# Patient Record
Sex: Female | Born: 1945 | Race: White | Hispanic: No | Marital: Married | Smoking: Never smoker
Health system: Southern US, Community
[De-identification: ages and names within clinical notes are randomized; demographics above are authoritative.]

## PROBLEM LIST (undated history)

## (undated) DIAGNOSIS — C55 Malignant neoplasm of uterus, part unspecified: Secondary | ICD-10-CM

## (undated) HISTORY — DX: Malignant neoplasm of uterus, part unspecified: C55

---

## 1968-01-21 HISTORY — PX: TUBAL LIGATION: SHX77

## 1973-01-20 HISTORY — PX: BREAST EXCISIONAL BIOPSY: SUR124

## 1973-01-20 HISTORY — PX: BREAST LUMPECTOMY: SHX2

## 2016-01-25 DIAGNOSIS — C55 Malignant neoplasm of uterus, part unspecified: Secondary | ICD-10-CM

## 2016-01-25 HISTORY — DX: Malignant neoplasm of uterus, part unspecified: C55

## 2017-04-22 ENCOUNTER — Encounter: Payer: Self-pay | Admitting: Gynecologic Oncology

## 2017-04-22 ENCOUNTER — Inpatient Hospital Stay: Payer: Medicare Other | Attending: Gynecologic Oncology | Admitting: Gynecologic Oncology

## 2017-04-22 VITALS — BP 124/83 | HR 93 | Temp 97.7°F | Resp 20 | Ht 63.0 in | Wt 156.5 lb

## 2017-04-22 DIAGNOSIS — C541 Malignant neoplasm of endometrium: Secondary | ICD-10-CM

## 2017-04-22 DIAGNOSIS — Z8543 Personal history of malignant neoplasm of ovary: Secondary | ICD-10-CM

## 2017-04-22 DIAGNOSIS — Z8542 Personal history of malignant neoplasm of other parts of uterus: Secondary | ICD-10-CM | POA: Insufficient documentation

## 2017-04-22 DIAGNOSIS — Z90710 Acquired absence of both cervix and uterus: Secondary | ICD-10-CM | POA: Diagnosis not present

## 2017-04-22 DIAGNOSIS — N3946 Mixed incontinence: Secondary | ICD-10-CM | POA: Diagnosis not present

## 2017-04-22 DIAGNOSIS — Z90722 Acquired absence of ovaries, bilateral: Secondary | ICD-10-CM | POA: Diagnosis not present

## 2017-04-22 NOTE — Patient Instructions (Addendum)
We will make a referral for you to meet with a urologist at [facility].  Please notify Dr Denman George at phone number [phone] if you notice vaginal bleeding, new pelvic or abdominal pains, bloating, feeling full easy, or a change in bladder or bowel function.   Please contact Dr Serita Grit office at the above number after April 15th to request a follow-up visit with her in July, 2019. She will see you every three months until February, 2020.

## 2017-04-24 ENCOUNTER — Encounter: Payer: Self-pay | Admitting: Gynecologic Oncology

## 2017-04-24 NOTE — Progress Notes (Signed)
New Patient Note: Gyn-Onc  Lisa [name] 72 y.o. female  CC:  Chief Complaint  Patient presents with  . Endometrial cancer ([place])  . Mixed stress and urge urinary incontinence    Assessment/Plan:  [name]  is a 72 y.o.  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 MSI stable.  She is NED on today's exam. Recommend 3 monthly surveillance exams until February, 2020.  She has mixed stress and urge incontinence. I am recommending that she be seen by Dr Matilde Sprang or Dr Louis Meckel from [facility] for further evaluation and treatment of this.   HPI: [name] is a 71 year old parous woman who was diagnosed with a grade 2 endometrioid adenocarcinoma her in January 2018.  At that time she was living in [facility].  On March 10, 2016 Dr. Ronal Fear performed a laparoscopy converted to laparotomy, modified radical abdominal hysterectomy, BSO, retroperitoneal pelvic lymphadenectomy.  Periaortic sampling also took place.  Final pathology revealed a 2 cm grade 2 endometrioid endometrial adenocarcinoma with 70% myometrial invasion present.  There is not a comment in the pathology report of lymphovascular space invasion.  11 lymph nodes were sampled including 4 from the left pelvic 7 from the right pelvic 1 from the left aortic and 2 from the right aortic.  All were negative for apparent metastases.  The ovaries tubes and cervix were free of disease.  She was determined to have high intermediate risk factors based on her uterine pathology and was recommended adjuvant vaginal brachytherapy.  The patient states that she declined this because she was told by her surgeon that he was "sure I got it all out".  She did however receive [employer] chemo resistance assay testing which revealed that her tumor would be sensitive to carboplatin paclitaxel in addition to  multiple other agents.  Genomic analysis revealed intact nuclear expression of mL H1 [place] 2 and [place] 6 and PMS 2 reflecting no loss of expression of mismatch repair proteins and a microsatellite stable tumor.  Since that time she has remained disease free. She has relocated from [place] to [place]. Her major complaints is of mixed stress and urge incontinence (urinary).   Current Meds:  Outpatient Encounter Medications as of 04/22/2017  Medication Sig  . Cholecalciferol (VITAMIN D3 PO) Take 25 mg by mouth daily.  . Magnesium Oxide 400 MG CAPS Take 1 capsule by mouth daily.  . Multiple Vitamins-Minerals (CENTRUM SILVER 50+WOMEN PO) Take 1 tablet by mouth daily.  Marland Kitchen OVER THE COUNTER MEDICATION   . SUPER B COMPLEX/C PO Take 1 tablet by mouth daily.   No facility-administered encounter medications on file as of 04/22/2017.     Allergy:  Allergies  Allergen Reactions  . Codeine Nausea And Vomiting    Social Hx:   Social History   Socioeconomic History  . Marital status: Married    Spouse name: Not on file  . Number of children: Not on file  . Years of education: Not on file  . Highest education level: Not on file  Occupational History  . Not on file  Social Needs  . Financial resource strain: Not on file  . Food insecurity:    Worry: Not on file    Inability: Not on file  . Transportation needs:    Medical: Not on file    Non-medical: Not on file  Tobacco Use  . Smoking status: Never  Smoker  . Smokeless tobacco: Never Used  Substance and Sexual Activity  . Alcohol use: Yes    Comment: occasional drinker  . Drug use: Never  . Sexual activity: Not on file  Lifestyle  . Physical activity:    Days per week: Not on file    Minutes per session: Not on file  . Stress: Not on file  Relationships  . Social connections:    Talks on phone: Not on file    Gets together: Not on file    Attends religious service: Not on file    Active member of club or organization: Not on  file    Attends meetings of clubs or organizations: Not on file    Relationship status: Not on file  . Intimate partner violence:    Fear of current or ex partner: Not on file    Emotionally abused: Not on file    Physically abused: Not on file    Forced sexual activity: Not on file  Other Topics Concern  . Not on file  Social History Narrative  . Not on file    Past Surgical Hx:  Past Surgical History:  Procedure Laterality Date  . BREAST LUMPECTOMY Left 1975  . TUBAL LIGATION  1970    Past Medical Hx:  Past Medical History:  Diagnosis Date  . Uterine cancer ([place]) 01/25/2016    Past Gynecological History:  Endometrial cancer (see above) No LMP recorded.  Family Hx: History reviewed. No pertinent family history.  Review of Systems:  Constitutional  Feels well,    ENT Normal appearing ears and nares bilaterally Skin/Breast  No rash, sores, jaundice, itching, dryness Cardiovascular  No chest pain, shortness of breath, or edema  Pulmonary  No cough or wheeze.  Gastro Intestinal  No nausea, vomitting, or diarrhoea. No bright red blood per rectum, no abdominal pain, change in bowel movement, or constipation.  Genito Urinary  No frequency, urgency, dysuria, no bleeding + urinary incontinence.  Musculo Skeletal  No myalgia, arthralgia, joint swelling or pain  Neurologic  No weakness, numbness, change in gait,  Psychology  No depression, anxiety, insomnia.   Vitals:  Blood pressure 124/83, pulse 93, temperature 97.7 F (36.5 C), temperature source Oral, resp. rate 20, height 5' 3" (1.6 m), weight 156 lb 8 oz (71 kg), SpO2 99 %.  Physical Exam: WD in NAD Neck  Supple NROM, without any enlargements.  Lymph Node Survey No cervical supraclavicular or inguinal adenopathy Cardiovascular  Pulse normal rate, regularity and rhythm. S1 and S2 normal.  Lungs  Clear to auscultation bilateraly, without wheezes/crackles/rhonchi. Good air movement.  Skin  No  rash/lesions/breakdown  Psychiatry  Alert and oriented to person, place, and time  Abdomen  Normoactive bowel sounds, abdomen soft, non-tender and nonobese without evidence of hernia. Back No CVA tenderness Genito Urinary  Vulva/vagina: Normal external female genitalia.  No lesions. No discharge or bleeding.  Bladder/urethra:  No lesions or masses, well supported bladder  Vagina: normal, free of lesions,   Cervix and uterus : surgically absent  Rectal  deferred Extremities  No bilateral cyanosis, clubbing or edema.   Thereasa Solo, MD  04/24/2017, 6:44 PM

## 2017-04-27 ENCOUNTER — Telehealth: Payer: Self-pay | Admitting: *Deleted

## 2017-04-27 NOTE — Telephone Encounter (Signed)
Fax refer to [facility]

## 2017-04-29 NOTE — Telephone Encounter (Signed)
LM for patient that the referral was sent to [facility] earlier this week.  Call [facility] if you have not heard from them by Wednesday 05-06-17 so we can follow up on the referral.

## 2017-05-14 ENCOUNTER — Telehealth: Payer: Self-pay | Admitting: *Deleted

## 2017-05-14 NOTE — Telephone Encounter (Signed)
Patient is scheduled for May 23rd at 9:30am to see Dr. Matilde Sprang

## 2017-05-22 ENCOUNTER — Telehealth: Payer: Self-pay | Admitting: *Deleted

## 2017-05-22 NOTE — Telephone Encounter (Signed)
Attempted to call the patient back, left message for the patient to call the office back. Patient needs to be scheduled for an appt in July.

## 2017-07-24 ENCOUNTER — Telehealth: Payer: Self-pay | Admitting: *Deleted

## 2017-07-24 NOTE — Telephone Encounter (Signed)
Called and moved the appt from July 10th to July 8th

## 2017-07-27 ENCOUNTER — Inpatient Hospital Stay: Payer: Medicare Other | Attending: Gynecologic Oncology | Admitting: Gynecologic Oncology

## 2017-07-27 ENCOUNTER — Encounter: Payer: Self-pay | Admitting: Gynecologic Oncology

## 2017-07-27 VITALS — BP 156/77 | HR 92 | Temp 97.9°F | Resp 18 | Ht 63.0 in | Wt 158.5 lb

## 2017-07-27 DIAGNOSIS — Z90710 Acquired absence of both cervix and uterus: Secondary | ICD-10-CM

## 2017-07-27 DIAGNOSIS — Z90722 Acquired absence of ovaries, bilateral: Secondary | ICD-10-CM

## 2017-07-27 DIAGNOSIS — C541 Malignant neoplasm of endometrium: Secondary | ICD-10-CM

## 2017-07-27 DIAGNOSIS — Z8542 Personal history of malignant neoplasm of other parts of uterus: Secondary | ICD-10-CM

## 2017-07-27 NOTE — Progress Notes (Signed)
Return Patient Note: Gyn-Onc  Lisa [name] 72 y.o. female  CC:  Chief Complaint  Patient presents with  . Endometrial cancer [facility])    Assessment/Plan:  [name]  is a 72 y.o.  year old with a history of stage IB grade [name] endometrioid endometrial cancer with high/intermediate risk features (declined adjuvant therapy). MSI stable.  She is NED on today's exam. Recommend 3 monthly surveillance exams until February, 2020.   HPI: [name] is a 72 year old parous woman who was diagnosed with a grade [name] endometrioid adenocarcinoma her in January 2018.  At that time she was living in [facility].  On March 10, 2016 Dr. Ronal [name] performed a laparoscopy converted to laparotomy, modified radical abdominal hysterectomy, BSO, retroperitoneal pelvic lymphadenectomy.  Periaortic sampling also took place.  Final pathology revealed a [name] cm grade [name] endometrioid endometrial adenocarcinoma with 70% myometrial invasion present.  There is not a comment in the pathology report of lymphovascular space invasion.  11 lymph nodes were sampled including 4 from the left pelvic 7 from the right pelvic 1 from the left aortic and [name] from the right aortic.  All were negative for apparent metastases.  The ovaries tubes and cervix were free of disease.  She was determined to have high intermediate risk factors based on her uterine pathology and was recommended adjuvant vaginal brachytherapy.  The patient states that she declined this because she was told by her surgeon that he was "sure I got it all out".  She did however receive Lisa [name] chemo resistance assay testing which revealed that her tumor would be sensitive to carboplatin paclitaxel in addition to multiple other agents.  Genomic analysis revealed intact nuclear expression of mL Lisa [name] and Lisa [name] and Lisa [name] reflecting no loss of expression of mismatch repair proteins and a microsatellite stable tumor.  She relocated  from [place] to [place].  Interval Hx:  She was seen by [facility] and treated for urinary urge incontinence with Mybetric which has significantly improved symptoms.  She has no symptoms concerning for recurrence.   Current Meds:  Outpatient Encounter Medications as of 07/27/2017  Medication Sig  . Cholecalciferol (VITAMIN D3 PO) Take 25 mg by mouth daily.  . Magnesium Oxide 400 MG CAPS Take 1 capsule by mouth daily.  . mirabegron ER (MYRBETRIQ) 50 MG TB24 tablet Take 50 mg by mouth daily.  . Multiple Vitamins-Minerals (CENTRUM SILVER 50+WOMEN PO) Take 1 tablet by mouth daily.  Marland Kitchen OVER THE COUNTER MEDICATION   . SUPER B COMPLEX/C PO Take 1 tablet by mouth daily.   No facility-administered encounter medications on file as of 07/27/2017.     Allergy:  Allergies  Allergen Reactions  . Codeine Nausea And Vomiting    Social Hx:   Social History   Socioeconomic History  . Marital status: Married    Spouse name: Not on file  . Number of children: Not on file  . Years of education: Not on file  . Highest education level: Not on file  Occupational History  . Not on file  Social Needs  . Financial resource strain: Not on file  . Food insecurity:    Worry: Not on file    Inability: Not on file  . Transportation needs:    Medical: Not on file    Non-medical: Not on file  Tobacco Use  . Smoking status: Never Smoker  . Smokeless tobacco: Never Used  Substance and Sexual Activity  .  Alcohol use: Yes    Comment: occasional drinker  . Drug use: Never  . Sexual activity: Not on file  Lifestyle  . Physical activity:    Days per week: Not on file    Minutes per session: Not on file  . Stress: Not on file  Relationships  . Social connections:    Talks on phone: Not on file    Gets together: Not on file    Attends religious service: Not on file    Active member of club or organization: Not on file    Attends meetings of clubs or organizations: Not on file     Relationship status: Not on file  . Intimate partner violence:    [name] of current or ex partner: Not on file    Emotionally abused: Not on file    Physically abused: Not on file    Forced sexual activity: Not on file  Other Topics Concern  . Not on file  Social History Narrative  . Not on file    Past Surgical Hx:  Past Surgical History:  Procedure Laterality Date  . BREAST LUMPECTOMY Left 1975  . TUBAL LIGATION  1970    Past Medical Hx:  Past Medical History:  Diagnosis Date  . Uterine cancer ([place]) 01/25/2016    Past Gynecological History:  Endometrial cancer (see above) No LMP recorded.  Family Hx: History reviewed. No pertinent family history.  Review of Systems:  Constitutional  Feels well,    ENT Normal appearing ears and nares bilaterally Skin/Breast  No rash, sores, jaundice, itching, dryness Cardiovascular  No chest pain, shortness of breath, or edema  Pulmonary  No cough or wheeze.  Gastro Intestinal  No nausea, vomitting, or diarrhoea. No bright red blood per rectum, no abdominal pain, change in bowel movement, or constipation.  Genito Urinary  No frequency, urgency, dysuria, no bleeding + urinary incontinence - much better on Mybetric.  Musculo Skeletal  No myalgia, arthralgia, joint swelling or pain  Neurologic  No weakness, numbness, change in gait,  Psychology  No depression, anxiety, insomnia.   Vitals:  Blood pressure (!) 156/77, pulse 92, temperature 97.9 F (36.[name] C), temperature source Oral, resp. rate 18, height _0  (1.[name] m), weight 158 lb 8 oz (71.9 kg), SpO2 100 %.  Physical Exam: WD in NAD Neck  Supple NROM, without any enlargements.  Lymph Node Survey No cervical supraclavicular or inguinal adenopathy Cardiovascular  Pulse normal rate, regularity and rhythm. S1 and S2 normal.  Lungs  Clear to auscultation bilateraly, without wheezes/crackles/rhonchi. Good air movement.  Skin  No rash/lesions/breakdown  Psychiatry  Alert and  oriented to person, place, and time  Abdomen  Normoactive bowel sounds, abdomen soft, non-tender and nonobese without evidence of hernia. Back No CVA tenderness Genito Urinary  Vulva/vagina: Normal external female genitalia.  No lesions. No discharge or bleeding.  Bladder/urethra:  No lesions or masses, well supported bladder  Vagina: normal, free of lesions,   Cervix and uterus : surgically absent  Rectal  deferred Extremities  No bilateral cyanosis, clubbing or edema.   Thereasa Solo, MD  07/27/2017, [name]:42 PM

## 2017-07-27 NOTE — Patient Instructions (Signed)
Please notify Dr Denman George at phone number [phone] if you notice vaginal bleeding, new pelvic or abdominal pains, bloating, feeling full easy, or a change in bladder or bowel function.   Please return to see Dr Denman George in 3 months as scheduled.

## 2017-07-29 ENCOUNTER — Ambulatory Visit: Payer: Medicare Other | Admitting: Gynecologic Oncology

## 2017-11-12 ENCOUNTER — Encounter: Payer: Self-pay | Admitting: Gynecologic Oncology

## 2017-11-12 ENCOUNTER — Inpatient Hospital Stay: Payer: Medicare Other | Attending: Gynecologic Oncology | Admitting: Gynecologic Oncology

## 2017-11-12 VITALS — BP 155/79 | HR 84 | Temp 97.6°F | Resp 18 | Ht 63.0 in | Wt 160.2 lb

## 2017-11-12 DIAGNOSIS — Z90710 Acquired absence of both cervix and uterus: Secondary | ICD-10-CM

## 2017-11-12 DIAGNOSIS — C541 Malignant neoplasm of endometrium: Secondary | ICD-10-CM

## 2017-11-12 DIAGNOSIS — Z08 Encounter for follow-up examination after completed treatment for malignant neoplasm: Secondary | ICD-10-CM | POA: Diagnosis present

## 2017-11-12 DIAGNOSIS — Z8543 Personal history of malignant neoplasm of ovary: Secondary | ICD-10-CM | POA: Diagnosis not present

## 2017-11-12 DIAGNOSIS — Z90722 Acquired absence of ovaries, bilateral: Secondary | ICD-10-CM | POA: Insufficient documentation

## 2017-11-12 NOTE — Progress Notes (Signed)
Return Patient Note: Gyn-Onc  Lisa [name] 72 y.o. female  CC:  Chief Complaint  Patient presents with  . Endometrial cancer [facility])    Assessment/Plan:  Lisa [name]  is a 72 y.o.  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 MSI stable.  She is NED on today's exam. Recommend 3 monthly surveillance exams until February, 2020.  HPI: Lisa [name] is a 72 year old parous woman who was diagnosed with a grade 2 endometrioid adenocarcinoma her in January 2018.  At that time she was living in [facility].  On March 10, 2016 Dr. Ronal Fear performed a laparoscopy converted to laparotomy, modified radical abdominal hysterectomy, BSO, retroperitoneal pelvic lymphadenectomy.  Periaortic sampling also took place.  Final pathology revealed a 2 cm grade 2 endometrioid endometrial adenocarcinoma with 70% myometrial invasion present.  There is not a comment in the pathology report of lymphovascular space invasion.  11 lymph nodes were sampled including 4 from the left pelvic 7 from the right pelvic 1 from the left aortic and 2 from the right aortic.  All were negative for apparent metastases.  The ovaries tubes and cervix were free of disease.  She was determined to have high intermediate risk factors based on her uterine pathology and was recommended adjuvant vaginal brachytherapy.  The patient states that she declined this because she was told by her surgeon that he was "sure I got it all out".  She did however receive [employer] chemo resistance assay testing which revealed that her tumor would be sensitive to carboplatin paclitaxel in addition to multiple other agents.  Genomic analysis revealed intact nuclear expression of mL H1 [place] 2 and [place] 6 and PMS 2 reflecting no loss of expression of mismatch repair proteins and a microsatellite stable  tumor.  She relocated from [place] to [place].  Interval Hx:  She was seen by [facility] and treated for urinary urge incontinence with Mybetric which has significantly improved symptoms.  She has no symptoms concerning for recurrence.   Current Meds:  Outpatient Encounter Medications as of 11/12/2017  Medication Sig  . Cholecalciferol (VITAMIN D3 PO) Take 25 mg by mouth daily.  . Magnesium Oxide 400 MG CAPS Take 1 capsule by mouth daily.  . Multiple Vitamins-Minerals (CENTRUM SILVER 50+WOMEN PO) Take 1 tablet by mouth daily.  Marland Kitchen OVER THE COUNTER MEDICATION   . SUPER B COMPLEX/C PO Take 1 tablet by mouth daily.  . [DISCONTINUED] mirabegron ER (MYRBETRIQ) 50 MG TB24 tablet Take 50 mg by mouth daily.   No facility-administered encounter medications on file as of 11/12/2017.     Allergy:  Allergies  Allergen Reactions  . Codeine Nausea And Vomiting    Social Hx:   Social History   Socioeconomic History  . Marital status: Married    Spouse name: Not on file  . Number of children: Not on file  . Years of education: Not on file  . Highest education level: Not on file  Occupational History  . Not on file  Social Needs  . Financial resource strain: Not on file  . Food insecurity:    Worry: Not on file    Inability: Not on file  . Transportation needs:    Medical: Not on file    Non-medical: Not on file  Tobacco Use  . Smoking status: Never Smoker  . Smokeless tobacco: Never Used  Substance  and Sexual Activity  . Alcohol use: Yes    Comment: occasional drinker  . Drug use: Never  . Sexual activity: Not on file  Lifestyle  . Physical activity:    Days per week: Not on file    Minutes per session: Not on file  . Stress: Not on file  Relationships  . Social connections:    Talks on phone: Not on file    Gets together: Not on file    Attends religious service: Not on file    Active member of club or organization: Not on file    Attends meetings of clubs  or organizations: Not on file    Relationship status: Not on file  . Intimate partner violence:    Fear of current or ex partner: Not on file    Emotionally abused: Not on file    Physically abused: Not on file    Forced sexual activity: Not on file  Other Topics Concern  . Not on file  Social History Narrative  . Not on file    Past Surgical Hx:  Past Surgical History:  Procedure Laterality Date  . BREAST LUMPECTOMY Left 1975  . TUBAL LIGATION  1970    Past Medical Hx:  Past Medical History:  Diagnosis Date  . Uterine cancer ([place]) 01/25/2016    Past Gynecological History:  Endometrial cancer (see above) No LMP recorded.  Family Hx:  Family History  Problem Relation Age of Onset  . Hypertension Mother   . Stroke Mother   . Hypertension Father     Review of Systems:  Constitutional  Feels well,    ENT Normal appearing ears and nares bilaterally Skin/Breast  No rash, sores, jaundice, itching, dryness Cardiovascular  No chest pain, shortness of breath, or edema  Pulmonary  No cough or wheeze.  Gastro Intestinal  No nausea, vomitting, or diarrhoea. No bright red blood per rectum, no abdominal pain, change in bowel movement, or constipation.  Genito Urinary  No frequency, urgency, dysuria, no bleeding + urinary incontinence - much better on Mybetric.  Musculo Skeletal  No myalgia, arthralgia, joint swelling or pain  Neurologic  No weakness, numbness, change in gait,  Psychology  No depression, anxiety, insomnia.   Vitals:  Blood pressure (!) 155/79, pulse 84, temperature 97.6 F (36.4 C), temperature source Oral, resp. rate 18, height 5' 3"  (1.6 m), weight 160 lb 3 oz (72.7 kg), SpO2 100 %.  Physical Exam: WD in NAD Neck  Supple NROM, without any enlargements.  Lymph Node Survey No cervical supraclavicular or inguinal adenopathy Cardiovascular  Pulse normal rate, regularity and rhythm. S1 and S2 normal.  Lungs  Clear to auscultation bilateraly,  without wheezes/crackles/rhonchi. Good air movement.  Skin  No rash/lesions/breakdown  Psychiatry  Alert and oriented to person, place, and time  Abdomen  Normoactive bowel sounds, abdomen soft, non-tender and nonobese without evidence of hernia. Back No CVA tenderness Genito Urinary  Vulva/vagina: Normal external female genitalia.  No lesions. No discharge or bleeding.  Bladder/urethra:  No lesions or masses, well supported bladder  Vagina: normal, free of lesions,   Cervix and uterus : surgically absent  Rectal  deferred Extremities  No bilateral cyanosis, clubbing or edema.   Thereasa Solo, MD  11/12/2017, 2:21 PM

## 2017-11-12 NOTE — Patient Instructions (Signed)
Please notify Dr Denman George at phone number [phone] if you notice vaginal bleeding, new pelvic or abdominal pains, bloating, feeling full easy, or a change in bladder or bowel function.   Please return to see Dr Denman George in January, 2020

## 2017-11-16 ENCOUNTER — Other Ambulatory Visit: Payer: Self-pay | Admitting: Family Medicine

## 2017-11-16 DIAGNOSIS — Z1231 Encounter for screening mammogram for malignant neoplasm of breast: Secondary | ICD-10-CM

## 2018-02-12 ENCOUNTER — Inpatient Hospital Stay: Payer: Medicare Other | Attending: Gynecologic Oncology | Admitting: Gynecologic Oncology

## 2018-02-12 ENCOUNTER — Encounter: Payer: Self-pay | Admitting: Gynecologic Oncology

## 2018-02-12 VITALS — BP 156/80 | HR 83 | Temp 98.2°F | Resp 20 | Ht 62.0 in | Wt 164.9 lb

## 2018-02-12 DIAGNOSIS — Z90722 Acquired absence of ovaries, bilateral: Secondary | ICD-10-CM | POA: Insufficient documentation

## 2018-02-12 DIAGNOSIS — C541 Malignant neoplasm of endometrium: Secondary | ICD-10-CM

## 2018-02-12 DIAGNOSIS — Z90710 Acquired absence of both cervix and uterus: Secondary | ICD-10-CM | POA: Diagnosis not present

## 2018-02-12 DIAGNOSIS — Z8542 Personal history of malignant neoplasm of other parts of uterus: Secondary | ICD-10-CM | POA: Insufficient documentation

## 2018-02-12 DIAGNOSIS — Z08 Encounter for follow-up examination after completed treatment for malignant neoplasm: Secondary | ICD-10-CM | POA: Insufficient documentation

## 2018-02-12 NOTE — Progress Notes (Signed)
Return Patient Note: Gyn-Onc  Lisa [name] 73 y.o. female  CC:  Chief Complaint  Patient presents with  . Endometrial cancer [facility])    Assessment/Plan:  [name]  is a 73 y.o.  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 MSI stable.  She is NED on today's exam. Will transition to 6 monthly evaluations.  HPI: [name] is a 73 year old parous woman who was diagnosed with a grade 2 endometrioid adenocarcinoma her in January 2018.  At that time she was living in [facility].  On March 10, 2016 Dr. Ronal Fear performed a laparoscopy converted to laparotomy, modified radical abdominal hysterectomy, BSO, retroperitoneal pelvic lymphadenectomy.  Periaortic sampling also took place.  Final pathology revealed a 2 cm grade 2 endometrioid endometrial adenocarcinoma with 70% myometrial invasion present.  There is not a comment in the pathology report of lymphovascular space invasion.  11 lymph nodes were sampled including 4 from the left pelvic 7 from the right pelvic 1 from the left aortic and 2 from the right aortic.  All were negative for apparent metastases.  The ovaries tubes and cervix were free of disease.  She was determined to have high intermediate risk factors based on her uterine pathology and was recommended adjuvant vaginal brachytherapy.  The patient states that she declined this because she was told by her surgeon that he was "sure I got it all out".  She did however receive [employer] chemo resistance assay testing which revealed that her tumor would be sensitive to carboplatin paclitaxel in addition to multiple other agents.  Genomic analysis revealed intact nuclear expression of mL H1 [place] 2 and [place] 6 and PMS 2 reflecting no loss of expression of mismatch repair proteins and a microsatellite stable tumor.  She  relocated from [place] to [place].  Interval Hx:  She was seen by [facility] and treated for urinary urge incontinence with Mybetric which has significantly improved symptoms.  She has no symptoms concerning for recurrence.   Current Meds:  Outpatient Encounter Medications as of 02/12/2018  Medication Sig  . Cholecalciferol (VITAMIN D3 PO) Take 25 mg by mouth daily.  . Magnesium Oxide 400 MG CAPS Take 1 capsule by mouth daily.  . Multiple Vitamins-Minerals (CENTRUM SILVER 50+WOMEN PO) Take 1 tablet by mouth daily.  Marland Kitchen OVER THE COUNTER MEDICATION   . SUPER B COMPLEX/C PO Take 1 tablet by mouth daily.   No facility-administered encounter medications on file as of 02/12/2018.     Allergy:  Allergies  Allergen Reactions  . Codeine Nausea And Vomiting    Social Hx:   Social History   Socioeconomic History  . Marital status: Married    Spouse name: Not on file  . Number of children: Not on file  . Years of education: Not on file  . Highest education level: Not on file  Occupational History  . Not on file  Social Needs  . Financial resource strain: Not on file  . Food insecurity:    Worry: Not on file    Inability: Not on file  . Transportation needs:    Medical: Not on file    Non-medical: Not on file  Tobacco Use  . Smoking status: Never Smoker  . Smokeless tobacco: Never Used  Substance and Sexual Activity  . Alcohol use: Yes    Comment: occasional drinker  . Drug use:  Never  . Sexual activity: Not on file  Lifestyle  . Physical activity:    Days per week: Not on file    Minutes per session: Not on file  . Stress: Not on file  Relationships  . Social connections:    Talks on phone: Not on file    Gets together: Not on file    Attends religious service: Not on file    Active member of club or organization: Not on file    Attends meetings of clubs or organizations: Not on file    Relationship status: Not on file  . Intimate partner violence:     Fear of current or ex partner: Not on file    Emotionally abused: Not on file    Physically abused: Not on file    Forced sexual activity: Not on file  Other Topics Concern  . Not on file  Social History Narrative  . Not on file    Past Surgical Hx:  Past Surgical History:  Procedure Laterality Date  . BREAST LUMPECTOMY Left 1975  . TUBAL LIGATION  1970    Past Medical Hx:  Past Medical History:  Diagnosis Date  . Uterine cancer ([place]) 01/25/2016    Past Gynecological History:  Endometrial cancer (see above) No LMP recorded.  Family Hx:  Family History  Problem Relation Age of Onset  . Hypertension Mother   . Stroke Mother   . Hypertension Father     Review of Systems:  Constitutional  Feels well,    ENT Normal appearing ears and nares bilaterally Skin/Breast  No rash, sores, jaundice, itching, dryness Cardiovascular  No chest pain, shortness of breath, or edema  Pulmonary  No cough or wheeze.  Gastro Intestinal  No nausea, vomitting, or diarrhoea. No bright red blood per rectum, no abdominal pain, change in bowel movement, or constipation.  Genito Urinary  No frequency, urgency, dysuria, no bleeding + urinary incontinence - much better on Mybetric.  Musculo Skeletal  No myalgia, arthralgia, joint swelling or pain  Neurologic  No weakness, numbness, change in gait,  Psychology  No depression, anxiety, insomnia.   Vitals:  Blood pressure (!) 156/80, pulse 83, temperature 98.2 F (36.8 C), temperature source Oral, resp. rate 20, height 5' 2"  (1.575 m), weight 164 lb 14.4 oz (74.8 kg), SpO2 98 %.  Physical Exam: WD in NAD Neck  Supple NROM, without any enlargements.  Lymph Node Survey No cervical supraclavicular or inguinal adenopathy Cardiovascular  Pulse normal rate, regularity and rhythm. S1 and S2 normal.  Lungs  Clear to auscultation bilateraly, without wheezes/crackles/rhonchi. Good air movement.  Skin  No rash/lesions/breakdown  Psychiatry   Alert and oriented to person, place, and time  Abdomen  Normoactive bowel sounds, abdomen soft, non-tender and nonobese without evidence of hernia. Back No CVA tenderness Genito Urinary  Vulva/vagina: Normal external female genitalia.  No lesions. No discharge or bleeding.  Bladder/urethra:  No lesions or masses, well supported bladder  Vagina: normal, free of lesions,   Cervix and uterus : surgically absent  Rectal  deferred Extremities  No bilateral cyanosis, clubbing or edema.   Thereasa Solo, MD  02/12/2018, 1:28 PM

## 2018-02-12 NOTE — Patient Instructions (Signed)
Please notify Dr Denman George at phone number [phone] if you notice vaginal bleeding, new pelvic or abdominal pains, bloating, feeling full easy, or a change in bladder or bowel function.   Please contact Dr Serita Grit office (at [phone]) in April, 2020 to request an appointment with her for July, 2020.

## 2018-05-10 ENCOUNTER — Telehealth: Payer: Self-pay | Admitting: *Deleted

## 2018-05-10 NOTE — Telephone Encounter (Signed)
Returned the patient's call and explained that she needs to call back the end of May to schedule an appt for July.

## 2018-08-11 ENCOUNTER — Inpatient Hospital Stay: Payer: Medicare Other | Attending: Gynecologic Oncology | Admitting: Gynecologic Oncology

## 2018-08-11 ENCOUNTER — Other Ambulatory Visit: Payer: Self-pay

## 2018-08-11 ENCOUNTER — Encounter: Payer: Self-pay | Admitting: Gynecologic Oncology

## 2018-08-11 VITALS — BP 144/75 | HR 91 | Temp 97.6°F | Resp 19 | Ht 62.0 in | Wt 167.6 lb

## 2018-08-11 DIAGNOSIS — Z08 Encounter for follow-up examination after completed treatment for malignant neoplasm: Secondary | ICD-10-CM | POA: Insufficient documentation

## 2018-08-11 DIAGNOSIS — Z90710 Acquired absence of both cervix and uterus: Secondary | ICD-10-CM | POA: Diagnosis not present

## 2018-08-11 DIAGNOSIS — Z90722 Acquired absence of ovaries, bilateral: Secondary | ICD-10-CM | POA: Diagnosis not present

## 2018-08-11 DIAGNOSIS — C541 Malignant neoplasm of endometrium: Secondary | ICD-10-CM

## 2018-08-11 DIAGNOSIS — Z8542 Personal history of malignant neoplasm of other parts of uterus: Secondary | ICD-10-CM | POA: Diagnosis present

## 2018-08-11 NOTE — Progress Notes (Signed)
Return Patient Note: Gyn-Onc  Lisa [name] 73 y.o. female  CC:  Chief Complaint  Patient presents with  . endometrial cancer    Assessment/Plan:  [name]  is a 73 y.o.  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 MSI stable.  She is NED on today's exam. Will continue 6 monthly evaluations.  HPI: [name] is a 73 year old parous woman who was diagnosed with a grade 2 endometrioid adenocarcinoma her in January 2018.  At that time she was living in [facility].  On March 10, 2016 Dr. Ronal [name] performed a laparoscopy converted to laparotomy, modified radical abdominal hysterectomy, BSO, retroperitoneal pelvic lymphadenectomy.  Periaortic sampling also took place.  Final pathology revealed a 2 cm grade 2 endometrioid endometrial adenocarcinoma with 70% myometrial invasion present.  There is not a comment in the pathology report of lymphovascular space invasion.  11 lymph nodes were sampled including 4 from the left pelvic 7 from the right pelvic 1 from the left aortic and 2 from the right aortic.  All were negative for apparent metastases.  The ovaries tubes and cervix were free of disease.  She was determined to have high intermediate risk factors based on her uterine pathology and was recommended adjuvant vaginal brachytherapy.  The patient states that she declined this because she was told by her surgeon that he was "sure I got it all out".  She did however receive [employer] chemo resistance assay testing which revealed that her tumor would be sensitive to carboplatin paclitaxel in addition to multiple other agents.  Genomic analysis revealed intact nuclear expression of mL H1 [place] 2 and [place] 6 and PMS 2 reflecting no loss of expression of mismatch repair proteins and a microsatellite stable tumor.  She relocated from  [place] to [place].  Interval Hx:  She was seen by [facility] and treated for urinary urge incontinence with Mybetric which has significantly improved symptoms.  She has no symptoms concerning for recurrence.   She has no questions or concerns.  Today we discussed survivorship issues including weight loss and weight management.   Current Meds:  Outpatient Encounter Medications as of 08/11/2018  Medication Sig  . Cholecalciferol (VITAMIN D3 PO) Take 25 mg by mouth daily.  . Magnesium Oxide 400 MG CAPS Take 1 capsule by mouth daily.  . Multiple Vitamins-Minerals (CENTRUM SILVER 50+WOMEN PO) Take 1 tablet by mouth daily.  Marland Kitchen OVER THE COUNTER MEDICATION   . SUPER B COMPLEX/C PO Take 1 tablet by mouth daily.   No facility-administered encounter medications on file as of 08/11/2018.     Allergy:  Allergies  Allergen Reactions  . Codeine Nausea And Vomiting    Social Hx:   Social History   Socioeconomic History  . Marital status: Married    Spouse name: Not on file  . Number of children: Not on file  . Years of education: Not on file  . Highest education level: Not on file  Occupational History  . Not on file  Social Needs  . Financial resource strain: Not on file  . Food insecurity    Worry: Not on file    Inability: Not on file  . Transportation needs    Medical: Not on file    Non-medical: Not on file  Tobacco Use  . Smoking status: Never Smoker  . Smokeless tobacco: Never Used  Substance  and Sexual Activity  . Alcohol use: Yes    Comment: occasional drinker  . Drug use: Never  . Sexual activity: Not on file  Lifestyle  . Physical activity    Days per week: Not on file    Minutes per session: Not on file  . Stress: Not on file  Relationships  . Social Herbalist on phone: Not on file    Gets together: Not on file    Attends religious service: Not on file    Active member of club or organization: Not on file    Attends meetings of clubs  or organizations: Not on file    Relationship status: Not on file  . Intimate partner violence    [name] of current or ex partner: Not on file    Emotionally abused: Not on file    Physically abused: Not on file    Forced sexual activity: Not on file  Other Topics Concern  . Not on file  Social History Narrative  . Not on file    Past Surgical Hx:  Past Surgical History:  Procedure Laterality Date  . BREAST LUMPECTOMY Left 1975  . TUBAL LIGATION  1970    Past Medical Hx:  Past Medical History:  Diagnosis Date  . Uterine cancer ([place]) 01/25/2016    Past Gynecological History:  Endometrial cancer (see above) No LMP recorded.  Family Hx:  Family History  Problem Relation Age of Onset  . Hypertension Mother   . Stroke Mother   . Hypertension Father     Review of Systems:  Constitutional  Feels well,    ENT Normal appearing ears and nares bilaterally Skin/Breast  No rash, sores, jaundice, itching, dryness Cardiovascular  No chest pain, shortness of breath, or edema  Pulmonary  No cough or wheeze.  Gastro Intestinal  No nausea, vomitting, or diarrhoea. No bright red blood per rectum, no abdominal pain, change in bowel movement, or constipation.  Genito Urinary  No frequency, urgency, dysuria, no bleeding + urinary incontinence - much better on Mybetric.  Musculo Skeletal  No myalgia, arthralgia, joint swelling or pain  Neurologic  No weakness, numbness, change in gait,  Psychology  No depression, anxiety, insomnia.   Vitals:  Blood pressure (!) 144/75, pulse 91, temperature 97.6 F (36.4 C), temperature source Oral, resp. rate 19, height 5' 2" (1.575 m), weight 167 lb 9.6 oz (76 kg), SpO2 100 %.  Physical Exam: WD in NAD Neck  Supple NROM, without any enlargements.  Lymph Node Survey No cervical supraclavicular or inguinal adenopathy Cardiovascular  Pulse normal rate, regularity and rhythm. S1 and S2 normal.  Lungs  Clear to auscultation bilateraly,  without wheezes/crackles/rhonchi. Good air movement.  Skin  No rash/lesions/breakdown  Psychiatry  Alert and oriented to person, place, and time  Abdomen  Normoactive bowel sounds, abdomen soft, non-tender and nonobese without evidence of hernia. Back No CVA tenderness Genito Urinary  Vulva/vagina: Normal external female genitalia.  No lesions. No discharge or bleeding.  Bladder/urethra:  No lesions or masses, well supported bladder  Vagina: normal, free of lesions,   Cervix and uterus : surgically absent  Rectal  deferred Extremities  No bilateral cyanosis, clubbing or edema.   Thereasa Solo, MD  08/11/2018, 2:53 PM

## 2018-08-11 NOTE — Patient Instructions (Signed)
Please notify Dr Denman George at phone number [phone] if you notice vaginal bleeding, new pelvic or abdominal pains, bloating, feeling full easy, or a change in bladder or bowel function.   Please contact Dr Serita Grit office (at [phone]) in October, 2020 to request an appointment with her for January, 2021.

## 2018-11-29 ENCOUNTER — Telehealth: Payer: Self-pay | Admitting: *Deleted

## 2018-11-29 NOTE — Telephone Encounter (Signed)
Patient called and scheduled her follow up appt for January

## 2019-01-04 ENCOUNTER — Other Ambulatory Visit: Payer: Self-pay | Admitting: Family Medicine

## 2019-01-04 DIAGNOSIS — Z1231 Encounter for screening mammogram for malignant neoplasm of breast: Secondary | ICD-10-CM

## 2019-01-04 DIAGNOSIS — M85852 Other specified disorders of bone density and structure, left thigh: Secondary | ICD-10-CM

## 2019-02-08 ENCOUNTER — Other Ambulatory Visit: Payer: Self-pay

## 2019-02-08 ENCOUNTER — Encounter: Payer: Self-pay | Admitting: Gynecologic Oncology

## 2019-02-08 ENCOUNTER — Inpatient Hospital Stay: Payer: Medicare Other | Attending: Gynecologic Oncology | Admitting: Gynecologic Oncology

## 2019-02-08 VITALS — BP 124/74 | HR 93 | Temp 98.3°F | Resp 18 | Ht 64.0 in | Wt 170.1 lb

## 2019-02-08 DIAGNOSIS — N3941 Urge incontinence: Secondary | ICD-10-CM | POA: Diagnosis not present

## 2019-02-08 DIAGNOSIS — Z90722 Acquired absence of ovaries, bilateral: Secondary | ICD-10-CM | POA: Insufficient documentation

## 2019-02-08 DIAGNOSIS — C541 Malignant neoplasm of endometrium: Secondary | ICD-10-CM

## 2019-02-08 DIAGNOSIS — Z08 Encounter for follow-up examination after completed treatment for malignant neoplasm: Secondary | ICD-10-CM | POA: Diagnosis present

## 2019-02-08 DIAGNOSIS — Z90710 Acquired absence of both cervix and uterus: Secondary | ICD-10-CM | POA: Insufficient documentation

## 2019-02-08 DIAGNOSIS — Z8542 Personal history of malignant neoplasm of other parts of uterus: Secondary | ICD-10-CM

## 2019-02-08 NOTE — Progress Notes (Signed)
Return Patient Note: Gyn-Onc  Lisa [name] 74 y.o. female  CC:  Chief Complaint  Patient presents with  . Endometrial cancer [facility])    Assessment/Plan:  [name]  is a 74 y.o.  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 MSI stable.  She is NED on today's exam. Will continue 6 monthly evaluations.  HPI: [name] is a 74 year old parous woman who was diagnosed with a grade 2 endometrioid adenocarcinoma her in January 2018.  At that time she was living in [facility].  On March 10, 2016 Dr. Ronal [name] performed a laparoscopy converted to laparotomy, modified radical abdominal hysterectomy, BSO, retroperitoneal pelvic lymphadenectomy.  Periaortic sampling also took place.  Final pathology revealed a 2 cm grade 2 endometrioid endometrial adenocarcinoma with 70% myometrial invasion present.  There is not a comment in the pathology report of lymphovascular space invasion.  11 lymph nodes were sampled including 4 from the left pelvic 7 from the right pelvic 1 from the left aortic and 2 from the right aortic.  All were negative for apparent metastases.  The ovaries tubes and cervix were free of disease.  She was determined to have high intermediate risk factors based on her uterine pathology and was recommended adjuvant vaginal brachytherapy.  The patient states that she declined this because she was told by her surgeon that he was "sure I got it all out".  She did however receive Lisa [name] chemo resistance assay testing which revealed that her tumor would be sensitive to carboplatin paclitaxel in addition to multiple other agents.  Genomic analysis revealed intact nuclear expression of mL H1 [place] 2 and [place] 6 and PMS 2 reflecting no loss of expression of mismatch repair proteins and a microsatellite stable tumor.  She relocated  from [place] to [place].   She was seen by [facility] and treated for urinary urge incontinence with Mybetric which has significantly improved symptoms.  Interval Hx:  She has no symptoms concerning for recurrence.   She has no questions or concerns.   Today we discussed survivorship issues including weight loss and weight management.   Current Meds:  Outpatient Encounter Medications as of 02/08/2019  Medication Sig  . betamethasone dipropionate (DIPROLENE) 0.05 % ointment Apply 1 application topically daily.  . Cholecalciferol (VITAMIN D3 PO) Take 25 mg by mouth daily.  . cimetidine (TAGAMET) 300 MG tablet Take 300 mg by mouth 3 (three) times daily.  . Magnesium Oxide 400 MG CAPS Take 1 capsule by mouth daily.  . Multiple Vitamins-Minerals (CENTRUM SILVER 50+WOMEN PO) Take 1 tablet by mouth daily.  . SUPER B COMPLEX/C PO Take 1 tablet by mouth daily.  . [DISCONTINUED] OVER THE COUNTER MEDICATION    No facility-administered encounter medications on file as of 02/08/2019.    Allergy:  Allergies  Allergen Reactions  . Codeine Nausea And Vomiting  . Tylenol [Acetaminophen] Hives    Social Hx:   Social History   Socioeconomic History  . Marital status: Married    Spouse name: Not on file  . Number of children: Not on file  . Years of education: Not on file  . Highest education level: Not on file  Occupational History  . Not on file  Tobacco Use  . Smoking status: Never Smoker  . Smokeless tobacco: Never Used  Substance and Sexual Activity  . Alcohol use: Yes  Comment: occasional drinker  . Drug use: Never  . Sexual activity: Not on file  Other Topics Concern  . Not on file  Social History Narrative  . Not on file   Social Determinants of Health   Financial Resource Strain:   . Difficulty of Paying Living Expenses: Not on file  Food Insecurity:   . Worried About Charity fundraiser in the Last Year: Not on file  . Ran Out of Food in the Last Year:  Not on file  Transportation Needs:   . Lack of Transportation (Medical): Not on file  . Lack of Transportation (Non-Medical): Not on file  Physical Activity:   . Days of Exercise per Week: Not on file  . Minutes of Exercise per Session: Not on file  Stress:   . Feeling of Stress : Not on file  Social Connections:   . Frequency of Communication with Friends and Family: Not on file  . Frequency of Social Gatherings with Friends and Family: Not on file  . Attends Religious Services: Not on file  . Active Member of Clubs or Organizations: Not on file  . Attends Archivist Meetings: Not on file  . Marital Status: Not on file  Intimate Partner Violence:   . [name] of Current or Ex-Partner: Not on file  . Emotionally Abused: Not on file  . Physically Abused: Not on file  . Sexually Abused: Not on file    Past Surgical Hx:  Past Surgical History:  Procedure Laterality Date  . BREAST LUMPECTOMY Left 1975  . TUBAL LIGATION  1970    Past Medical Hx:  Past Medical History:  Diagnosis Date  . Uterine cancer ([place]) 01/25/2016    Past Gynecological History:  Endometrial cancer (see above) No LMP recorded.  Family Hx:  Family History  Problem Relation Age of Onset  . Hypertension Mother   . Stroke Mother   . Hypertension Father     Review of Systems:  Constitutional  Feels well,    ENT Normal appearing ears and nares bilaterally Skin/Breast  No rash, sores, jaundice, itching, dryness Cardiovascular  No chest pain, shortness of breath, or edema  Pulmonary  No cough or wheeze.  Gastro Intestinal  No nausea, vomitting, or diarrhoea. No bright red blood per rectum, no abdominal pain, change in bowel movement, or constipation.  Genito Urinary  No frequency, urgency, dysuria, no bleeding + urinary incontinence - much better on Mybetric.  Musculo Skeletal  No myalgia, arthralgia, joint swelling or pain  Neurologic  No weakness, numbness, change in gait,  Psychology   No depression, anxiety, insomnia.   Vitals:  Blood pressure 124/74, pulse 93, temperature 98.3 F (36.8 C), temperature source Temporal, resp. rate 18, height 5' 4"  (1.626 m), weight 170 lb 1 oz (77.1 kg), SpO2 99 %.  Physical Exam: WD in NAD Neck  Supple NROM, without any enlargements.  Lymph Node Survey No cervical supraclavicular or inguinal adenopathy Cardiovascular  Pulse normal rate, regularity and rhythm. S1 and S2 normal.  Lungs  Clear to auscultation bilateraly, without wheezes/crackles/rhonchi. Good air movement.  Skin  No rash/lesions/breakdown  Psychiatry  Alert and oriented to person, place, and time  Abdomen  Normoactive bowel sounds, abdomen soft, non-tender and nonobese without evidence of hernia. Back No CVA tenderness Genito Urinary  Vulva/vagina: Normal external female genitalia.  No lesions. No discharge or bleeding.  Bladder/urethra:  No lesions or masses, well supported bladder  Vagina: normal, free of lesions,   Cervix  and uterus : surgically absent  Rectal  deferred Extremities  No bilateral cyanosis, clubbing or edema.   Thereasa Solo, MD  02/08/2019, 2:16 PM

## 2019-02-08 NOTE — Patient Instructions (Signed)
Please notify Dr Srihitha Tagliaferri at phone number [phone] if you notice vaginal bleeding, new pelvic or abdominal pains, bloating, feeling full easy, or a change in bladder or bowel function.   Please contact Dr Austan Nicholl's office (at [phone]) in April to request an appointment with her for July, 2021.  

## 2019-03-31 ENCOUNTER — Other Ambulatory Visit: Payer: Self-pay

## 2019-03-31 ENCOUNTER — Ambulatory Visit
Admission: RE | Admit: 2019-03-31 | Discharge: 2019-03-31 | Disposition: A | Discharge status: Home / Self Care | Payer: Medicare Other | Source: Ambulatory Visit | Attending: Family Medicine | Admitting: Family Medicine

## 2019-03-31 DIAGNOSIS — Z1231 Encounter for screening mammogram for malignant neoplasm of breast: Secondary | ICD-10-CM

## 2019-03-31 DIAGNOSIS — M85852 Other specified disorders of bone density and structure, left thigh: Secondary | ICD-10-CM

## 2019-04-05 ENCOUNTER — Observation Stay (HOSPITAL_COMMUNITY): Payer: Medicare Other

## 2019-04-05 ENCOUNTER — Emergency Department (HOSPITAL_COMMUNITY): Payer: Medicare Other

## 2019-04-05 ENCOUNTER — Inpatient Hospital Stay (HOSPITAL_COMMUNITY)
Admission: EM | Admit: 2019-04-05 | Discharge: 2019-04-07 | DRG: 042 | Disposition: A | Discharge status: Home / Self Care | Payer: Medicare Other | Attending: Family Medicine | Admitting: Family Medicine

## 2019-04-05 ENCOUNTER — Encounter (HOSPITAL_COMMUNITY): Payer: Self-pay | Admitting: Emergency Medicine

## 2019-04-05 DIAGNOSIS — I63413 Cerebral infarction due to embolism of bilateral middle cerebral arteries: Secondary | ICD-10-CM

## 2019-04-05 DIAGNOSIS — E785 Hyperlipidemia, unspecified: Secondary | ICD-10-CM | POA: Diagnosis present

## 2019-04-05 DIAGNOSIS — Z8249 Family history of ischemic heart disease and other diseases of the circulatory system: Secondary | ICD-10-CM

## 2019-04-05 DIAGNOSIS — Z7982 Long term (current) use of aspirin: Secondary | ICD-10-CM

## 2019-04-05 DIAGNOSIS — C541 Malignant neoplasm of endometrium: Secondary | ICD-10-CM | POA: Diagnosis present

## 2019-04-05 DIAGNOSIS — G459 Transient cerebral ischemic attack, unspecified: Secondary | ICD-10-CM

## 2019-04-05 DIAGNOSIS — R4182 Altered mental status, unspecified: Secondary | ICD-10-CM | POA: Diagnosis not present

## 2019-04-05 DIAGNOSIS — R55 Syncope and collapse: Secondary | ICD-10-CM

## 2019-04-05 DIAGNOSIS — Z823 Family history of stroke: Secondary | ICD-10-CM

## 2019-04-05 DIAGNOSIS — N3946 Mixed incontinence: Secondary | ICD-10-CM | POA: Diagnosis present

## 2019-04-05 DIAGNOSIS — Z79899 Other long term (current) drug therapy: Secondary | ICD-10-CM

## 2019-04-05 DIAGNOSIS — I639 Cerebral infarction, unspecified: Secondary | ICD-10-CM | POA: Diagnosis present

## 2019-04-05 DIAGNOSIS — Z20822 Contact with and (suspected) exposure to covid-19: Secondary | ICD-10-CM | POA: Diagnosis present

## 2019-04-05 LAB — CBC
HCT: 43.2 % (ref 36.0–46.0)
Hemoglobin: 14.1 g/dL (ref 12.0–15.0)
MCH: 32.6 pg (ref 26.0–34.0)
MCHC: 32.6 g/dL (ref 30.0–36.0)
MCV: 100 fL (ref 80.0–100.0)
Platelets: 290 10*3/uL (ref 150–400)
RBC: 4.32 MIL/uL (ref 3.87–5.11)
RDW: 12.6 % (ref 11.5–15.5)
WBC: 7.5 10*3/uL (ref 4.0–10.5)
nRBC: 0 % (ref 0.0–0.2)

## 2019-04-05 LAB — DIFFERENTIAL
Abs Immature Granulocytes: 0.01 10*3/uL (ref 0.00–0.07)
Basophils Absolute: 0 10*3/uL (ref 0.0–0.1)
Basophils Relative: 0 %
Eosinophils Absolute: 0.3 10*3/uL (ref 0.0–0.5)
Eosinophils Relative: 4 %
Immature Granulocytes: 0 %
Lymphocytes Relative: 24 %
Lymphs Abs: 1.8 10*3/uL (ref 0.7–4.0)
Monocytes Absolute: 0.7 10*3/uL (ref 0.1–1.0)
Monocytes Relative: 9 %
Neutro Abs: 4.7 10*3/uL (ref 1.7–7.7)
Neutrophils Relative %: 63 %

## 2019-04-05 LAB — TSH: TSH: 0.923 u[IU]/mL (ref 0.350–4.500)

## 2019-04-05 LAB — I-STAT CHEM 8, ED
BUN: 22 mg/dL (ref 8–23)
Calcium, Ion: 1.09 mmol/L — ABNORMAL LOW (ref 1.15–1.40)
Chloride: 104 mmol/L (ref 98–111)
Creatinine, Ser: 1.1 mg/dL — ABNORMAL HIGH (ref 0.44–1.00)
Glucose, Bld: 111 mg/dL — ABNORMAL HIGH (ref 70–99)
HCT: 41 % (ref 36.0–46.0)
Hemoglobin: 13.9 g/dL (ref 12.0–15.0)
Potassium: 4.3 mmol/L (ref 3.5–5.1)
Sodium: 139 mmol/L (ref 135–145)
TCO2: 28 mmol/L (ref 22–32)

## 2019-04-05 LAB — COMPREHENSIVE METABOLIC PANEL
ALT: 17 U/L (ref 0–44)
AST: 18 U/L (ref 15–41)
Albumin: 3.6 g/dL (ref 3.5–5.0)
Alkaline Phosphatase: 72 U/L (ref 38–126)
Anion gap: 10 (ref 5–15)
BUN: 19 mg/dL (ref 8–23)
CO2: 25 mmol/L (ref 22–32)
Calcium: 9 mg/dL (ref 8.9–10.3)
Chloride: 104 mmol/L (ref 98–111)
Creatinine, Ser: 1 mg/dL (ref 0.44–1.00)
GFR calc Af Amer: >60 mL/min (ref >60)
GFR calc non Af Amer: 55 mL/min — ABNORMAL LOW (ref >60)
Glucose, Bld: 122 mg/dL — ABNORMAL HIGH (ref 70–99)
Potassium: 4.5 mmol/L (ref 3.5–5.1)
Sodium: 139 mmol/L (ref 135–145)
Total Bilirubin: 0.7 mg/dL (ref 0.3–1.2)
Total Protein: 6.5 g/dL (ref 6.5–8.1)

## 2019-04-05 LAB — CBG MONITORING, ED: Glucose-Capillary: 92 mg/dL (ref 70–99)

## 2019-04-05 LAB — APTT: aPTT: 25 seconds (ref 24–36)

## 2019-04-05 LAB — PROTIME-INR
INR: 1 (ref 0.8–1.2)
Prothrombin Time: 12.7 seconds (ref 11.4–15.2)

## 2019-04-05 LAB — SARS CORONAVIRUS 2 (TAT 6-24 HRS): SARS Coronavirus 2: NEGATIVE

## 2019-04-05 MED ORDER — LORAZEPAM 0.5 MG PO TABS: 0.5000 mg | ORAL_TABLET | Freq: Four times a day (QID) | ORAL | Status: DC | PRN

## 2019-04-05 MED ORDER — ENOXAPARIN SODIUM 40 MG/0.4ML ~~LOC~~ SOLN
40.0000 mg | SUBCUTANEOUS | Status: DC
  Administered 2019-04-05 – 2019-04-06 (×2): 40 mg via SUBCUTANEOUS
  Filled 2019-04-05 (×2): qty 0.4

## 2019-04-05 MED ORDER — ASPIRIN 300 MG RE SUPP: 300.0000 mg | Freq: Every day | RECTAL | Status: DC

## 2019-04-05 MED ORDER — STROKE: EARLY STAGES OF RECOVERY BOOK: Freq: Once | Status: DC

## 2019-04-05 MED ORDER — SENNOSIDES-DOCUSATE SODIUM 8.6-50 MG PO TABS: 1.0000 | ORAL_TABLET | Freq: Every evening | ORAL | Status: DC | PRN

## 2019-04-05 MED ORDER — SODIUM CHLORIDE 0.9% FLUSH
3.0000 mL | Freq: Once | INTRAVENOUS | Status: DC
Start: 2019-04-05 — End: 2019-04-07

## 2019-04-05 MED ORDER — FAMOTIDINE 20 MG PO TABS: 10.0000 mg | ORAL_TABLET | Freq: Every day | ORAL | Status: DC

## 2019-04-05 MED ORDER — ASPIRIN 325 MG PO TABS
325.0000 mg | ORAL_TABLET | Freq: Every day | ORAL | Status: DC
  Administered 2019-04-05: 325 mg via ORAL
  Filled 2019-04-05: qty 1

## 2019-04-05 MED ORDER — IOHEXOL 350 MG/ML SOLN
75.0000 mL | Freq: Once | INTRAVENOUS | Status: AC | PRN
  Administered 2019-04-05: 75 mL via INTRAVENOUS

## 2019-04-05 MED ORDER — ATORVASTATIN CALCIUM 40 MG PO TABS
40.0000 mg | ORAL_TABLET | Freq: Every day | ORAL | Status: DC
  Administered 2019-04-06: 40 mg via ORAL
  Filled 2019-04-05: qty 1

## 2019-04-05 NOTE — ED Triage Notes (Signed)
Pt in via [facility] as code stroke, LSN 1355. States she was walking into [employer] and began to have L sided wkns/numbness, having trouble ambulating. She laid herself to the ground, and EMS reports she was unable to move her mid-left shin down to her L foot. Pt's symptoms reported by EMS to be resolving en route. CT 2 on ED arrival

## 2019-04-05 NOTE — ED Notes (Signed)
Pt remains in MRI at this time  

## 2019-04-05 NOTE — Code Documentation (Signed)
Stroke Response Nurse Documentation Code Documentation  Lisa [name] is a 74 y.o. female arriving to [facility] ED via [facility] at 14:51 with past medical hx of uterine cancer. Code stroke was activated by EMS. Patient from [facility] where she was LKW at 13:55, walking into store then experienced sudden onset of dizziness, and difficulty walking. Stroke team at the bedside on patient arrival. Labs drawn and patient cleared for CT by Dr. Sabra Heck. Patient to CT with team. NIHSS 0, see documentation for details and code stroke times. The following imaging was completed: CT, CTA head and neck.Patient is not a candidate for tPA due to symptoms resolved. Will follow TIA alert protocol, q2 VS/mNHISS. Bedside handoff with ED RN Vicente Males.    Lianne Bushy A  Stroke Response RN

## 2019-04-05 NOTE — ED Provider Notes (Signed)
[facility] EMERGENCY DEPARTMENT Provider Note   CSN: [id number] Arrival date & time: 04/05/19  1451  An emergency department physician performed an initial assessment on this suspected stroke patient at 1453.  History Chief Complaint  Patient presents with  . Code Stroke    Lisa [name] is a 73 y.o. female with no significant past medical history who takes 81 mg of aspirin daily presented to emergency department with acute onset of vertigo and left leg weakness and numbness.  She reports that she was grocery shopping around 2 PM when she had a sudden onset of vertigo, feeling the room is feeling spinning around her.  She also had left leg weakness.  She lowered her self to the floor but did not fall did not strike her head.  She said that her symptoms are largely resolved since riding in the ambulance into the emergency department.  She denies any prior history of stroke or TIA.  She denies any current headache.  She denies any chest pain or palpitations.  HPI     Past Medical History:  Diagnosis Date  . Uterine cancer ([place]) 01/25/2016    Patient Active Problem List   Diagnosis Date Noted  . CVA (cerebral vascular accident) ([place]) 04/05/2019  . TIA (transient ischemic attack) 04/05/2019  . Endometrial cancer ([place]) 04/22/2017  . 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 BREAST EXCISIONAL BIOPSY Left 1975  . TUBAL LIGATION  1970     OB History   No obstetric history on file.     Family History  Problem Relation Age of Onset  . Hypertension Mother   . Stroke Mother   . Hypertension Father     Social History   Tobacco Use  . Smoking status: Never Smoker  . Smokeless tobacco: Never Used  Substance Use Topics  . Alcohol use: Yes    Comment: occasional drinker  . Drug use: Never    Home Medications Prior to Admission medications   Medication Sig Start Date End Date  Taking? Authorizing Provider  betamethasone dipropionate (DIPROLENE) 0.05 % ointment Apply 1 application topically daily. 01/04/19  Yes [provider]  Cholecalciferol (VITAMIN D3 PO) Take 25 mg by mouth daily.   Yes [provider]  fexofenadine (ALLEGRA) 180 MG tablet Take 180 mg by mouth daily.   Yes [provider]  Magnesium Oxide 400 MG CAPS Take 1 capsule by mouth daily.   Yes [provider]  Multiple Vitamins-Minerals (CENTRUM SILVER 50+WOMEN PO) Take 1 tablet by mouth daily.   Yes [provider]  SUPER B COMPLEX/C PO Take 1 tablet by mouth daily.   Yes [provider]    Allergies    Codeine and Tylenol [acetaminophen]  Review of Systems   Review of Systems  Constitutional: Negative for chills and fever.  Respiratory: Negative for cough and shortness of breath.   Cardiovascular: Negative for chest pain and palpitations.  Gastrointestinal: Negative for abdominal pain and vomiting.  Musculoskeletal: Negative for arthralgias and back pain.  Skin: Negative for color change and rash.  Neurological: Positive for dizziness, weakness, light-headedness and numbness. Negative for syncope.  All other systems reviewed and are negative.   Physical Exam Updated Vital Signs BP (!) 142/59 (BP Location: Right Arm)   Pulse 91   Temp 98.1 F (36.7 C) (Oral)   Resp 20   Wt 77.1 kg   SpO2 98%  BMI 29.18 kg/m   Physical Exam Vitals and nursing note reviewed.  Constitutional:      General: She is not in acute distress.    Appearance: She is well-developed.  HENT:     Head: Normocephalic and atraumatic.  Eyes:     Conjunctiva/sclera: Conjunctivae normal.  Cardiovascular:     Rate and Rhythm: Normal rate and regular rhythm.     Pulses: Normal pulses.  Pulmonary:     Effort: Pulmonary effort is normal. No respiratory distress.  Musculoskeletal:     Cervical back: Neck supple.  Skin:    General: Skin is warm and dry.    Neurological:     Mental Status: She is alert.     GCS: GCS eye subscore is 4. GCS verbal subscore is 5. GCS motor subscore is 6.     Cranial Nerves: Cranial nerves are intact.     Sensory: Sensation is intact.     Motor: Motor function is intact.     Coordination: Coordination is intact.  Psychiatric:        Mood and Affect: Mood normal.        Behavior: Behavior normal.     ED Results / Procedures / Treatments   Labs (all labs ordered are listed, but only abnormal results are displayed) Labs Reviewed  COMPREHENSIVE METABOLIC PANEL - Abnormal; Notable for the following components:      Result Value   Glucose, Bld 122 (*)    GFR calc non Af Amer 55 (*)    All other components within normal limits  HEMOGLOBIN A1C - Abnormal; Notable for the following components:   Hgb A1c MFr Bld 5.7 (*)    All other components within normal limits  LIPID PANEL - Abnormal; Notable for the following components:   Cholesterol 211 (*)    LDL Cholesterol 139 (*)    All other components within normal limits  I-STAT CHEM 8, ED - Abnormal; Notable for the following components:   Creatinine, Ser 1.10 (*)    Glucose, Bld 111 (*)    Calcium, Ion 1.09 (*)    All other components within normal limits  SARS CORONAVIRUS 2 (TAT 6-24 HRS)  PROTIME-INR  APTT  CBC  DIFFERENTIAL  TSH  CBG MONITORING, ED    EKG EKG Interpretation  Date/Time:  Tuesday April 05 2019 15:11:15 EDT Ventricular Rate:  94 PR Interval:    QRS Duration: 96 QT Interval:  403 QTC Calculation: 504 R Axis:   -56 Text Interpretation: Sinus rhythm Prominent P waves, nondiagnostic Left anterior fascicular block Low voltage, precordial leads Abnormal R-wave progression, early transition Prolonged QT interval No STEMI Confirmed by Octaviano Glow [phone]) on 04/05/2019 3:28:27 PM   Radiology CT Code Stroke CTA Head W/WO contrast  Result Date: 04/05/2019 CLINICAL DATA:  Left-sided weakness EXAM: CT ANGIOGRAPHY HEAD AND NECK  TECHNIQUE: Multidetector CT imaging of the head and neck was performed using the standard protocol during bolus administration of intravenous contrast. Multiplanar CT image reconstructions and MIPs were obtained to evaluate the vascular anatomy. Carotid stenosis measurements (when applicable) are obtained utilizing NASCET criteria, using the distal internal carotid diameter as the denominator. CONTRAST:  65mL OMNIPAQUE IOHEXOL 350 MG/ML SOLN COMPARISON:  None. FINDINGS: CTA NECK FINDINGS Aortic arch: Great vessel origins are patent. Right carotid system: Patent. There is eccentric noncalcified plaque along the common carotid causing less than 50% stenosis. No measurable stenosis at the ICA origin. Left carotid system: Patent. Minimal calcified plaque at the ICA origin.  No measurable stenosis. Vertebral arteries: Patent and tortuous. Right vertebral artery is dominant. Skeleton: Multilevel degenerative changes of the cervical spine. Other neck: No mass or adenopathy. Upper chest: No apical lung mass. Review of the MIP images confirms the above findings CTA HEAD FINDINGS Anterior circulation: Intracranial internal carotid arteries are patent with minor calcified plaque. Shallow broad-based protrusion along the distal supraclinoid left ICA directed inferiorly measuring approximately 2 x 1 mm. Anterior cerebral arteries are patent. 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 There is atherosclerotic irregularity and eccentric noncalcified plaque along the right M1 MCA causing mild stenosis. Posterior circulation: Intracranial vertebral arteries are patent. Left vertebral artery becomes diminutive after PICA origin. Basilar artery is patent. Posterior cerebral arteries are patent. There is a large right posterior communicating artery. Venous sinuses: As permitted by contrast timing, patent. Review of the MIP images confirms the above findings IMPRESSION: No large vessel occlusion. No hemodynamically significant stenosis in the  neck. 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 Initial results were communicated to Dr. Leonel Ramsay at 3:10 pmon 3/16/2021by text page via the [facility] messaging system. Electronically Signed   By: Macy Mis M.D.   On: 04/05/2019 15:20   CT Code Stroke CTA Neck W/WO contrast  Result Date: 04/05/2019 CLINICAL DATA:  Left-sided weakness EXAM: CT ANGIOGRAPHY HEAD AND NECK TECHNIQUE: Multidetector CT imaging of the head and neck was performed using the standard protocol during bolus administration of intravenous contrast. Multiplanar CT image reconstructions and MIPs were obtained to evaluate the vascular anatomy. Carotid stenosis measurements (when applicable) are obtained utilizing NASCET criteria, using the distal internal carotid diameter as the denominator. CONTRAST:  55mL OMNIPAQUE IOHEXOL 350 MG/ML SOLN COMPARISON:  None. FINDINGS: CTA NECK FINDINGS Aortic arch: Great vessel origins are patent. Right carotid system: Patent. There is eccentric noncalcified plaque along the common carotid causing less than 50% stenosis. No measurable stenosis at the ICA origin. Left carotid system: Patent. Minimal calcified plaque at the ICA origin. No measurable stenosis. Vertebral arteries: Patent and tortuous. Right vertebral artery is dominant. Skeleton: Multilevel degenerative changes of the cervical spine. Other neck: No mass or adenopathy. Upper chest: No apical lung mass. Review of the MIP images confirms the above findings CTA HEAD FINDINGS Anterior circulation: Intracranial internal carotid arteries are patent with minor calcified plaque. Shallow broad-based protrusion along the distal supraclinoid left ICA directed inferiorly measuring approximately 2 x 1 mm. Anterior cerebral arteries are patent. 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 There is atherosclerotic irregularity and eccentric noncalcified plaque along the right M1 MCA causing mild  stenosis. Posterior circulation: Intracranial vertebral arteries are patent. Left vertebral artery becomes diminutive after PICA origin. Basilar artery is patent. Posterior cerebral arteries are patent. There is a large right posterior communicating artery. Venous sinuses: As permitted by contrast timing, patent. Review of the MIP images confirms the above findings IMPRESSION: No large vessel occlusion. No hemodynamically significant stenosis in the neck. 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 Initial results were communicated to Dr. Leonel Ramsay at 3:10 pmon 3/16/2021by text page via the [facility] messaging system. Electronically Signed   By: Macy Mis M.D.   On: 04/05/2019 15:20   MR BRAIN WO CONTRAST  Result Date: 04/05/2019 CLINICAL DATA:  Transient ischemic attack EXAM: MRI HEAD WITHOUT CONTRAST TECHNIQUE: Multiplanar, multiecho pulse sequences of the brain and surrounding structures were obtained without intravenous contrast. COMPARISON:  None. FINDINGS: BRAIN: There are punctate foci of diffusion restriction within the corona radiata bilaterally. Diffuse confluent hyperintense T2-weighted signal within the periventricular, deep and juxtacortical white matter, most commonly due to chronic ischemic microangiopathy. There are multiple old infarcts, including the anterior left MCA territory and multiple locations within the cerebellum. Advanced generalized atrophy. Midline structures are normal. Left middle cranial fossa arachnoid cyst. VASCULAR: Major flow voids are preserved. Susceptibility-sensitive sequences show no chronic microhemorrhage or superficial siderosis. SKULL AND UPPER CERVICAL SPINE: Normal calvarium and skull base. Visualized upper cervical spine and soft tissues are normal. SINUSES/ORBITS: No fluid levels or advanced mucosal thickening. No mastoid or middle ear effusion. Normal orbits. IMPRESSION: 1. Multiple punctate  foci of acute ischemia within the corona radiata bilaterally. No hemorrhage or mass effect. 2. Multiple old infarcts and findings of chronic ischemic microangiopathy. Electronically Signed   By: Ulyses Jarred M.D.   On: 04/05/2019 19:32   CT HEAD CODE STROKE WO CONTRAST  Result Date: 04/05/2019 CLINICAL DATA:  Code stroke.  Left-sided weakness EXAM: CT HEAD WITHOUT CONTRAST TECHNIQUE: Contiguous axial images were obtained from the base of the skull through the vertex without intravenous contrast. COMPARISON:  None. FINDINGS: Brain: There is no acute intracranial hemorrhage, mass effect, or edema. Multiple chronic infarcts are identified including involvement of the left frontal lobe, right parietal lobe, corona radiata bilaterally, left occipital lobe, posteromedial right temporal lobe, and bilateral cerebellar hemispheres. Additional patchy and confluent hypoattenuation in the supratentorial white matter is nonspecific but probably reflects chronic microvascular ischemic changes. Prominence of the ventricles sulci reflects generalized parenchymal volume loss. Vascular: No hyperdense vessel. There is intracranial atherosclerotic calcification at the skull base. Skull: Unremarkable. Sinuses/Orbits: No acute finding. Other: Mastoid air cells are clear. Degenerative changes at the temporomandibular joints. ASPECTS [facility] Stroke Program Early CT Score) - Ganglionic level infarction (caudate, lentiform nuclei, internal capsule, insula, M1-M3 cortex): 7 - Supraganglionic infarction (M4-M6 cortex): 3 Total score (0-10 with 10 being normal): 10 IMPRESSION: 1. No acute intracranial hemorrhage or evidence of acute infarction. 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 2. ASPECTS is 10 accounting for chronic infarcts. These results were communicated to Dr. Cheral Marker at 3:04 pmon 3/16/2021by text page via the [facility] messaging system. Electronically Signed   By: Macy Mis M.D.   On: 04/05/2019  15:08   VAS [place] LOWER EXTREMITY VENOUS (DVT)  Result Date: 04/06/2019  Lower Venous DVTStudy Indications: Stroke.  Comparison Study: No prior study on file Performing Technologist: Sharion Dove RVS  Examination Guidelines: A complete evaluation includes B-mode imaging, spectral Doppler, color Doppler, and power Doppler as needed of all accessible portions of each vessel. Bilateral testing is considered an integral part of a complete examination. Limited examinations for reoccurring indications may be performed as noted. The reflux portion of the exam is performed with the patient in reverse Trendelenburg.  +---------+---------------+---------+-----------+----------+--------------+ RIGHT    CompressibilityPhasicitySpontaneityPropertiesThrombus Aging +---------+---------------+---------+-----------+----------+--------------+ CFV      Full           Yes      Yes                                 +---------+---------------+---------+-----------+----------+--------------+ SFJ      Full                                                        +---------+---------------+---------+-----------+----------+--------------+  FV Prox  Full                                                        +---------+---------------+---------+-----------+----------+--------------+ FV Mid   Full                                                        +---------+---------------+---------+-----------+----------+--------------+ FV DistalFull                                                        +---------+---------------+---------+-----------+----------+--------------+ PFV      Full                                                        +---------+---------------+---------+-----------+----------+--------------+ POP      Full           Yes      Yes                                 +---------+---------------+---------+-----------+----------+--------------+ PTV      Full                                                         +---------+---------------+---------+-----------+----------+--------------+ PERO     Full                                                        +---------+---------------+---------+-----------+----------+--------------+   +---------+---------------+---------+-----------+----------+--------------+ LEFT     CompressibilityPhasicitySpontaneityPropertiesThrombus Aging +---------+---------------+---------+-----------+----------+--------------+ CFV      Full           Yes      Yes                                 +---------+---------------+---------+-----------+----------+--------------+ SFJ      Full                                                        +---------+---------------+---------+-----------+----------+--------------+ FV Prox  Full                                                        +---------+---------------+---------+-----------+----------+--------------+  FV Mid   Full                                                        +---------+---------------+---------+-----------+----------+--------------+ FV DistalFull                                                        +---------+---------------+---------+-----------+----------+--------------+ PFV      Full                                                        +---------+---------------+---------+-----------+----------+--------------+ POP      Full           Yes      Yes                                 +---------+---------------+---------+-----------+----------+--------------+ PTV      Full                                                        +---------+---------------+---------+-----------+----------+--------------+ PERO     Full                                                        +---------+---------------+---------+-----------+----------+--------------+     Summary: BILATERAL: - No evidence of deep vein thrombosis seen in the lower  extremities, bilaterally.   *See table(s) above for measurements and observations.    Preliminary     Procedures Procedures (including critical care time)  Medications Ordered in ED Medications  sodium chloride flush (NS) 0.9 % injection 3 mL (3 mLs Intravenous Not Given 04/05/19 2339)   stroke: mapping our early stages of recovery book (has no administration in time range)  senna-docusate (Senokot-S) tablet 1 tablet (has no administration in time range)  enoxaparin (LOVENOX) injection 40 mg (40 mg Subcutaneous Given 04/05/19 1811)  atorvastatin (LIPITOR) tablet 40 mg (40 mg Oral Refused 04/05/19 1829)  LORazepam (ATIVAN) tablet 0.5 mg (has no administration in time range)  aspirin EC tablet 81 mg (81 mg Oral Given 04/06/19 1112)  clopidogrel (PLAVIX) tablet 75 mg (75 mg Oral Given 04/06/19 1112)  iohexol (OMNIPAQUE) 350 MG/ML injection 75 mL (75 mLs Intravenous Contrast Given 04/05/19 1506)    ED Course  I have reviewed the triage vital signs and the nursing notes.  Pertinent labs & imaging results that were available during my care of the patient were reviewed by me and considered in my medical decision making (see chart for details).  74 yo female presenting to ED with sudden onset vertigo and left leg numbness and weakness.  Symptoms now  resolved since arrival in ED.  Arrived as a stroke alert, went to CT with neurology.  Neurologist recommending admission for MRI and TIA workup.  Patient otherwise stable and asymptomatic in her room.  Clinical Course as of Apr 06 1311  Tue Apr 05, 2019  1528 Dr Leonel Ramsay recommending obs admission for echocardiogram, MRI - for TIA evaluation.     [MT]  [id number] Admitted to dr zhang hospitalist   [MT]    Clinical Course User Index [MT] Langston Masker Carola Rhine, MD   Final Clinical Impression(s) / ED Diagnoses Final diagnoses:  TIA (transient ischemic attack)    Rx / DC Orders ED Discharge Orders    None       Wyvonnia Dusky, MD 04/06/19  1314

## 2019-04-05 NOTE — ED Notes (Signed)
Pt in MRI at this time 

## 2019-04-05 NOTE — ED Notes (Signed)
Pt returned from MRI °

## 2019-04-05 NOTE — H&P (Signed)
History and Physical    Lisa [name] [id number] DOB: 20-May-1945 DOA: 04/05/2019  PCP: Cari Caraway, MD  Patient coming from: Home  I have personally briefly reviewed patient's old medical records in [place]  Chief Complaint: My legs gave up  HPI: Lisa [name] is a 74 y.o. female with medical history significant of Remote Uterine CA, presented with sudden onset of lower extremity weakness more on the left side.  Patient was pushing a shopping cart at [employer], suddenly felt weakness of lower extremities bilaterally, more on the left side and becomes numb as well almost " I did not feel my legs". Soon she started to feel lightheaded, and she felt like almost about to pass out, and her both arms started to feel weak as well.  But she denied any chest pain, or short of breath, no palpitations no nauseous or vomiting, no blurred vision no hearing changes.  She was able to control herself to slide down on the floor, but she denied any loss of consciousness.  Bystander called EMS.  Her symptoms significant improved and then resolved in about 30 minutes.  In the ER she was asymptomatic and back to her baseline.  She denied any such episode happened ever.  She says she takes aspirin.  ED Course: Code stroke was called, neurology recommend none TPA protocol, given significant improvement of patient's symptoms.  CTA showing "Shallow broad-based protrusion along the distal supraclinoid left ICA directed inferiorly measuring approximately 2 x 1 mm. Anterior cerebral arteries are patent. 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 There is atherosclerotic irregularity and eccentric noncalcified plaque along the right M1 MCA causing mild stenosis."  Review of Systems: As per HPI otherwise 10 point review of systems negative.    Past Medical History:  Diagnosis Date   Uterine cancer ([place]) 01/25/2016    Past Surgical History:  Procedure Laterality Date   BREAST EXCISIONAL  BIOPSY Left [place]     reports that she has never smoked. She has never used smokeless tobacco. She reports current alcohol use. She reports that she does not use drugs.  Allergies  Allergen Reactions   Codeine Nausea And Vomiting   Tylenol [Acetaminophen] Hives    Family History  Problem Relation Age of Onset   Hypertension Mother    Stroke Mother    Hypertension Father      Prior to Admission medications   Medication Sig Start Date End Date Taking? Authorizing Provider  betamethasone dipropionate (DIPROLENE) 0.05 % ointment Apply 1 application topically daily. 01/04/19  Yes [provider]  Cholecalciferol (VITAMIN D3 PO) Take 25 mg by mouth daily.   Yes [provider]  fexofenadine (ALLEGRA) 180 MG tablet Take 180 mg by mouth daily.   Yes [provider]  Magnesium Oxide 400 MG CAPS Take 1 capsule by mouth daily.   Yes [provider]  Multiple Vitamins-Minerals (CENTRUM SILVER 50+WOMEN PO) Take 1 tablet by mouth daily.   Yes [provider]  SUPER B COMPLEX/C PO Take 1 tablet by mouth daily.   Yes [provider]    Physical Exam: Vitals:   04/05/19 1536 04/05/19 1545 04/05/19 1558 04/05/19 1600  BP:  134/70 134/70 108/82  Pulse:  81 80 80  Resp:  14 17 12   Temp:   98.1 F (36.7 C)   TempSrc:   Oral   SpO2:  97% 100% 99%  Weight: 77.1 kg  Constitutional: NAD, calm, comfortable Vitals:   04/05/19 1536 04/05/19 1545 04/05/19 1558 04/05/19 1600  BP:  134/70 134/70 108/82  Pulse:  81 80 80  Resp:  14 17 12   Temp:   98.1 F (36.7 C)   TempSrc:   Oral   SpO2:  97% 100% 99%  Weight: 77.1 kg      Eyes: PERRL, lids and conjunctivae normal ENMT: Mucous membranes are moist. Posterior pharynx clear of any exudate or lesions.Normal dentition.  Neck: normal, supple, no masses, no thyromegaly Respiratory: clear to auscultation bilaterally, no wheezing, no crackles. Normal respiratory  effort. No accessory muscle use.  Cardiovascular: Regular rate and rhythm, no murmurs / rubs / gallops. No extremity edema. 2+ pedal pulses. No carotid bruits.  Abdomen: no tenderness, no masses palpated. No hepatosplenomegaly. Bowel sounds positive.  Musculoskeletal: no clubbing / cyanosis. No joint deformity upper and lower extremities. Good ROM, no contractures. Normal muscle tone.  Skin: no rashes, lesions, ulcers. No induration Neurologic: CN 2-12 grossly intact. Sensation intact, DTR normal. Strength 5/5 in all 4.  Psychiatric: Normal judgment and insight. Alert and oriented x 3. Normal mood.     Labs on Admission: I have personally reviewed following labs and imaging studies  CBC: Recent Labs  Lab 04/05/19 1453 04/05/19 1457  WBC 7.5  --   NEUTROABS 4.7  --   HGB 14.1 13.9  HCT 43.2 41.0  MCV 100.0  --   PLT 290  --    Basic Metabolic Panel: Recent Labs  Lab 04/05/19 1453 04/05/19 1457  NA 139 139  K 4.5 4.3  CL 104 104  CO2 25  --   GLUCOSE 122* 111*  BUN 19 22  CREATININE 1.00 1.10*  CALCIUM 9.0  --    GFR: Estimated Creatinine Clearance: 43.8 mL/min (A) (by C-G formula based on SCr of 1.1 mg/dL (H)). Liver Function Tests: Recent Labs  Lab 04/05/19 1453  AST 18  ALT 17  ALKPHOS 72  BILITOT 0.7  PROT 6.5  ALBUMIN 3.6   No results for input(s): LIPASE, AMYLASE in the last 168 hours. No results for input(s): AMMONIA in the last 168 hours. Coagulation Profile: Recent Labs  Lab 04/05/19 1453  INR 1.0   Cardiac Enzymes: No results for input(s): CKTOTAL, CKMB, CKMBINDEX, TROPONINI in the last 168 hours. BNP (last 3 results) No results for input(s): PROBNP in the last 8760 hours. HbA1C: No results for input(s): HGBA1C in the last 72 hours. CBG: Recent Labs  Lab 04/05/19 1519  GLUCAP 92   Lipid Profile: No results for input(s): CHOL, HDL, LDLCALC, TRIG, CHOLHDL, LDLDIRECT in the last 72 hours. Thyroid Function Tests: No results for input(s):  TSH, T4TOTAL, FREET4, T3FREE, THYROIDAB in the last 72 hours. Anemia Panel: No results for input(s): VITAMINB12, FOLATE, FERRITIN, TIBC, IRON, RETICCTPCT in the last 72 hours. Urine analysis: No results found for: COLORURINE, APPEARANCEUR, LABSPEC, PHURINE, GLUCOSEU, HGBUR, BILIRUBINUR, KETONESUR, PROTEINUR, UROBILINOGEN, NITRITE, LEUKOCYTESUR  Radiological Exams on Admission: CT Code Stroke CTA Head W/WO contrast  Result Date: 04/05/2019 CLINICAL DATA:  Left-sided weakness EXAM: CT ANGIOGRAPHY HEAD AND NECK TECHNIQUE: Multidetector CT imaging of the head and neck was performed using the standard protocol during bolus administration of intravenous contrast. Multiplanar CT image reconstructions and MIPs were obtained to evaluate the vascular anatomy. Carotid stenosis measurements (when applicable) are obtained utilizing NASCET criteria, using the distal internal carotid diameter as the denominator. CONTRAST:  53mL OMNIPAQUE IOHEXOL 350 MG/ML SOLN COMPARISON:  None. FINDINGS: CTA  NECK FINDINGS Aortic arch: Great vessel origins are patent. Right carotid system: Patent. There is eccentric noncalcified plaque along the common carotid causing less than 50% stenosis. No measurable stenosis at the ICA origin. Left carotid system: Patent. Minimal calcified plaque at the ICA origin. No measurable stenosis. Vertebral arteries: Patent and tortuous. Right vertebral artery is dominant. Skeleton: Multilevel degenerative changes of the cervical spine. Other neck: No mass or adenopathy. Upper chest: No apical lung mass. Review of the MIP images confirms the above findings CTA HEAD FINDINGS Anterior circulation: Intracranial internal carotid arteries are patent with minor calcified plaque. Shallow broad-based protrusion along the distal supraclinoid left ICA directed inferiorly measuring approximately 2 x 1 mm. Anterior cerebral arteries are patent. 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 There is atherosclerotic irregularity  and eccentric noncalcified plaque along the right M1 MCA causing mild stenosis. Posterior circulation: Intracranial vertebral arteries are patent. Left vertebral artery becomes diminutive after PICA origin. Basilar artery is patent. Posterior cerebral arteries are patent. There is a large right posterior communicating artery. Venous sinuses: As permitted by contrast timing, patent. Review of the MIP images confirms the above findings IMPRESSION: No large vessel occlusion. No hemodynamically significant stenosis in the neck. 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 Initial results were communicated to Dr. Leonel Ramsay at 3:10 pmon 3/16/2021by text page via the [facility] messaging system. Electronically Signed   By: Macy Mis M.D.   On: 04/05/2019 15:20   CT Code Stroke CTA Neck W/WO contrast  Result Date: 04/05/2019 CLINICAL DATA:  Left-sided weakness EXAM: CT ANGIOGRAPHY HEAD AND NECK TECHNIQUE: Multidetector CT imaging of the head and neck was performed using the standard protocol during bolus administration of intravenous contrast. Multiplanar CT image reconstructions and MIPs were obtained to evaluate the vascular anatomy. Carotid stenosis measurements (when applicable) are obtained utilizing NASCET criteria, using the distal internal carotid diameter as the denominator. CONTRAST:  30mL OMNIPAQUE IOHEXOL 350 MG/ML SOLN COMPARISON:  None. FINDINGS: CTA NECK FINDINGS Aortic arch: Great vessel origins are patent. Right carotid system: Patent. There is eccentric noncalcified plaque along the common carotid causing less than 50% stenosis. No measurable stenosis at the ICA origin. Left carotid system: Patent. Minimal calcified plaque at the ICA origin. No measurable stenosis. Vertebral arteries: Patent and tortuous. Right vertebral artery is dominant. Skeleton: Multilevel degenerative changes of the cervical spine. Other neck: No mass or  adenopathy. Upper chest: No apical lung mass. Review of the MIP images confirms the above findings CTA HEAD FINDINGS Anterior circulation: Intracranial internal carotid arteries are patent with minor calcified plaque. Shallow broad-based protrusion along the distal supraclinoid left ICA directed inferiorly measuring approximately 2 x 1 mm. Anterior cerebral arteries are patent. 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 There is atherosclerotic irregularity and eccentric noncalcified plaque along the right M1 MCA causing mild stenosis. Posterior circulation: Intracranial vertebral arteries are patent. Left vertebral artery becomes diminutive after PICA origin. Basilar artery is patent. Posterior cerebral arteries are patent. There is a large right posterior communicating artery. Venous sinuses: As permitted by contrast timing, patent. Review of the MIP images confirms the above findings IMPRESSION: No large vessel occlusion. No hemodynamically significant stenosis in the neck. 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 Initial results were communicated to Dr. Leonel Ramsay at 3:10 pmon 3/16/2021by text page via the  [facility] messaging system. Electronically Signed   By: Macy Mis  M.D.   On: 04/05/2019 15:20   CT HEAD CODE STROKE WO CONTRAST  Result Date: 04/05/2019 CLINICAL DATA:  Code stroke.  Left-sided weakness EXAM: CT HEAD WITHOUT CONTRAST TECHNIQUE: Contiguous axial images were obtained from the base of the skull through the vertex without intravenous contrast. COMPARISON:  None. FINDINGS: Brain: There is no acute intracranial hemorrhage, mass effect, or edema. Multiple chronic infarcts are identified including involvement of the left frontal lobe, right parietal lobe, corona radiata bilaterally, left occipital lobe, posteromedial right temporal lobe, and bilateral cerebellar hemispheres. Additional patchy and confluent hypoattenuation in the  supratentorial white matter is nonspecific but probably reflects chronic microvascular ischemic changes. Prominence of the ventricles sulci reflects generalized parenchymal volume loss. Vascular: No hyperdense vessel. There is intracranial atherosclerotic calcification at the skull base. Skull: Unremarkable. Sinuses/Orbits: No acute finding. Other: Mastoid air cells are clear. Degenerative changes at the temporomandibular joints. ASPECTS [facility] Stroke Program Early CT Score) - Ganglionic level infarction (caudate, lentiform nuclei, internal capsule, insula, M1-M3 cortex): 7 - Supraganglionic infarction (M4-M6 cortex): 3 Total score (0-10 with 10 being normal): 10 IMPRESSION: 1. No acute intracranial hemorrhage or evidence of acute infarction. 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 2. ASPECTS is 10 accounting for chronic infarcts. These results were communicated to Dr. Cheral Marker at 3:04 pmon 3/16/2021by text page via the [facility] messaging system. Electronically Signed   By: Macy Mis M.D.   On: 04/05/2019 15:08    EKG: Independently reviewed.  Prolonged QTC, poor R wave progression  Assessment/Plan Active Problems:   CVA (cerebral vascular accident) ([place])   TIA (transient ischemic attack)  TIA  CTA seems indicating some level of abnormalities: "Shallow broad-based protrusion along the distal supraclinoid left ICA directed inferiorly measuring approximately 2 x 1 mm. Anterior cerebral arteries are patent. 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 There is atherosclerotic irregularity and eccentric noncalcified plaque along the right M1 MCA causing mild stenosis."  MRI ordered Echo ordered ASA and Statin started  Near syncope Patient mentioned that she was told by EMS staff in the ambulance that her blood pressure was significantly elevated, which seemed like there was a vasovagal reaction at some point. She is euvolemic, will order orthostasis vital signs every  shift  Borderline elevation of QTC Check EKG in the morning   DVT prophylaxis: Lovenox Code Status: Full code Family Communication: Husband Disposition Plan: Probably can go home tomorrow after stroke study done Consults called: Dr. Leonel Ramsay Admission status: Telemetry observation   Lequita Halt MD [facility] Pager [phone]   04/05/2019, 5:33 PM

## 2019-04-05 NOTE — ED Notes (Signed)
Pt ambulatory to bathroom without any problems 

## 2019-04-05 NOTE — Consult Note (Signed)
Neurology Consultation Reason for Consult: TIA Referring Physician: Trifan, M  CC: Dizziness  History is obtained from: Patient  HPI: Lisa [name] is a 74 y.o. female who was walking to [employer] earlier today when she had sudden onset of dizziness and left leg weakness/numbness.  She states that she was normal while she was walking to [employer], but suddenly felt " dizzy."  On EMS arrival, EMS reported that her left leg was severely weak as well as numb.  They state that they were doing something to the foot and she did not even feel it.  A code stroke was therefore activated, however prior to arrival her symptoms resolved.   LKW: 1:55 PM tpa given?: no, resolution of symptoms    ROS: A 14 point ROS was performed and is negative except as noted in the HPI.   Past Medical History:  Diagnosis Date  . Uterine cancer ([place]) 01/25/2016     Family History  Problem Relation Age of Onset  . Hypertension Mother   . Stroke Mother   . Hypertension Father      Social History:  reports that she has never smoked. She has never used smokeless tobacco. She reports current alcohol use. She reports that she does not use drugs.   Exam: Current vital signs: BP 108/82   Pulse 80   Temp 98.1 F (36.7 C) (Oral)   Resp 12   Wt 77.1 kg   SpO2 99%   BMI 29.18 kg/m  Vital signs in last 24 hours: Temp:  [98.1 F (36.7 C)] 98.1 F (36.7 C) (03/16 1558) Pulse Rate:  [80-105] 80 (03/16 1600) Resp:  [12-26] 12 (03/16 1600) BP: (108-158)/(50-82) 108/82 (03/16 1600) SpO2:  [96 %-100 %] 99 % (03/16 1600) Weight:  [77.1 kg] 77.1 kg (03/16 1536)   Physical Exam  Constitutional: Appears well-developed and well-nourished.  Psych: Affect appropriate to situation Eyes: No scleral injection HENT: No OP obstrucion MSK: no joint deformities.  Cardiovascular: Normal rate and regular rhythm.  Respiratory: Effort normal, non-labored breathing GI: Soft.  No distension. There is no tenderness.   Skin: WDI  Neuro: Mental Status: Patient is awake, alert, oriented to person, place, month, year, and situation. Patient is able to give a clear and coherent history. No signs of aphasia or neglect Cranial Nerves: II: Visual Fields are full. Pupils are equal, round, and reactive to light.   III,IV, VI: EOMI without ptosis or diploplia.  V: Facial sensation is symmetric to temperature VII: Facial movement is symmetric.  VIII: hearing is intact to voice X: Uvula elevates symmetrically XI: Shoulder shrug is symmetric. XII: tongue is midline without atrophy or fasciculations.  Motor: Tone is normal. Bulk is normal. 5/5 strength was present in all four extremities.  Sensory: Sensation is symmetric to light touch and temperature in the arms and legs. Cerebellar: FNF and HKS are intact bilaterally   I have reviewed labs in epic and the results pertinent to this consultation are: CMP unremarkable  I have reviewed the images obtained: CT/CTA is negative  Impression: 74 year old female with transient left leg weakness and dizziness most consistent with TIA.  She will need to be admitted for secondary risk factor assessment and modification.  Recommendations: - HgbA1c, fasting lipid panel - MRI  of the brain without contrast - Frequent neuro checks - Echocardiogram - Prophylactic therapy-Antiplatelet med: Aspirin - dose 325mg  PO or 300mg  PR - Risk factor modification - Telemetry monitoring - PT consult, OT consult, Speech consult - Stroke  team to follow    Roland Rack, MD [facility] [phone]  If 7pm- 7am, please page neurology on call as listed in [place].

## 2019-04-06 ENCOUNTER — Encounter (HOSPITAL_COMMUNITY): Payer: Self-pay | Admitting: Internal Medicine

## 2019-04-06 ENCOUNTER — Observation Stay (HOSPITAL_BASED_OUTPATIENT_CLINIC_OR_DEPARTMENT_OTHER): Payer: Medicare Other

## 2019-04-06 DIAGNOSIS — C541 Malignant neoplasm of endometrium: Secondary | ICD-10-CM | POA: Diagnosis not present

## 2019-04-06 DIAGNOSIS — I639 Cerebral infarction, unspecified: Secondary | ICD-10-CM

## 2019-04-06 DIAGNOSIS — I63413 Cerebral infarction due to embolism of bilateral middle cerebral arteries: Secondary | ICD-10-CM | POA: Diagnosis not present

## 2019-04-06 DIAGNOSIS — I6389 Other cerebral infarction: Secondary | ICD-10-CM

## 2019-04-06 HISTORY — DX: Cerebral infarction, unspecified: I63.9

## 2019-04-06 LAB — LIPID PANEL
Cholesterol: 211 mg/dL — ABNORMAL HIGH (ref 0–200)
HDL: 51 mg/dL (ref >40)
LDL Cholesterol: 139 mg/dL — ABNORMAL HIGH (ref 0–99)
Total CHOL/HDL Ratio: 4.1 RATIO
Triglycerides: 107 mg/dL (ref <150)
VLDL: 21 mg/dL (ref 0–40)

## 2019-04-06 LAB — HEMOGLOBIN A1C
Hgb A1c MFr Bld: 5.7 % — ABNORMAL HIGH (ref 4.8–5.6)
Mean Plasma Glucose: 116.89 mg/dL

## 2019-04-06 LAB — ECHOCARDIOGRAM COMPLETE: Weight: 2719.59 oz

## 2019-04-06 MED ORDER — CLOPIDOGREL BISULFATE 75 MG PO TABS
75.0000 mg | ORAL_TABLET | Freq: Every day | ORAL | Status: DC
  Administered 2019-04-06 – 2019-04-07 (×2): 75 mg via ORAL
  Filled 2019-04-06 (×2): qty 1

## 2019-04-06 MED ORDER — ASPIRIN EC 81 MG PO TBEC
81.0000 mg | DELAYED_RELEASE_TABLET | Freq: Every day | ORAL | Status: DC
  Administered 2019-04-06 – 2019-04-07 (×2): 81 mg via ORAL
  Filled 2019-04-06 (×2): qty 1

## 2019-04-06 NOTE — Progress Notes (Signed)
  Echocardiogram 2D Echocardiogram has been performed.  Lisa [name] 04/06/2019, 10:56 AM

## 2019-04-06 NOTE — ED Notes (Signed)
PAGED TRIAD TO RN BECKA--Oshea Percival

## 2019-04-06 NOTE — Progress Notes (Signed)
04/06/19 1751  PT Visit Information  Last PT Received On 04/06/19  Assistance Needed +1  PT/OT/SLP Co-Evaluation/Treatment Yes  Reason for Co-Treatment To address functional/ADL transfers  PT goals addressed during session Mobility/safety with mobility;Balance  History of Present Illness Pt is 74 y/o female presenting with sudden onset of lower extremity weakness more on the left side. MRI shows bil MCA/ACA territory 2-3 punctate infarcts. PMH includes uterine cancer.  Precautions  Precautions None  Restrictions  Weight Bearing Restrictions No  Home Living  Family/patient expects to be discharged to: Private residence  Living Arrangements Spouse/significant other  Available Help at Discharge Available 24 hours/day  Type of [place] to enter  Entrance Stairs-Number of Steps 6  Entrance Stairs-Rails Right  [place] to live on main level with bedroom/bathroom;Two level  Alternate Level Stairs-Number of Steps full flight, but mostly stays on main  Alternate Level Stairs-Rails Left  Bathroom Shower/Tub Walk-in shower  Bathroom Toilet Handicapped height  Home Equipment None  Prior Function  Level of Independence Independent  Communication  Communication No difficulties  Pain Assessment  Pain Assessment No/denies pain  Cognition  Arousal/Alertness Awake/alert  Behavior During Therapy WFL for tasks assessed/performed  Overall Cognitive Status Within Functional Limits for tasks assessed  Upper Extremity Assessment  Upper Extremity Assessment Defer to OT evaluation  Lower Extremity Assessment  Lower Extremity Assessment Overall WFL for tasks assessed  Cervical / Trunk Assessment  Cervical / Trunk Assessment Normal  Bed Mobility  Overal bed mobility Independent  Transfers  Overall transfer level Independent  Ambulation/Gait  Ambulation/Gait assistance Min guard;Supervision  Gait Distance (Feet) 200 Feet  Assistive device None  Gait  Pattern/deviations Step-through pattern;Decreased stride length;Staggering right  General Gait Details Pt with initial LOB requiring min guard A, however, with increased distance. Pt able to perform horizontal and vertical head turns without LOB. Pt reports mild instability at baseline and reports she feels she is at baseline.   Gait velocity Decreased  Modified Rankin (Stroke Patients Only)  Pre-Morbid Rankin Score 0  Modified Rankin 4  Balance  Overall balance assessment Mild deficits observed, not formally tested  Standardized Balance Assessment  Standardized Balance Assessment  Dynamic Gait Index  Dynamic Gait Index  Level Surface 3  Change in Gait Speed 3  Gait with Horizontal Head Turns 3  Gait with Vertical Head Turns 3  Gait and Pivot Turn 2  Step Over Obstacle 2  General Comments  General comments (skin integrity, edema, etc.) Educated about "BE FAST" acronym in recognizing CVA symptoms.   PT - End of Session  Equipment Utilized During Treatment Gait belt  Activity Tolerance Patient tolerated treatment well  Patient left in bed;with call bell/phone within reach  Nurse Communication Mobility status  PT Assessment  PT Recommendation/Assessment Patent does not need any further PT services  PT Visit Diagnosis Unsteadiness on feet (R26.81);Other abnormalities of gait and mobility (R26.89)  No Skilled PT All education completed;Patient at baseline level of functioning;Patient will have necessary level of assist by caregiver at discharge  AM-PAC PT "6 Clicks" Mobility Outcome Measure (Version 2)  Help needed turning from your back to your side while in a flat bed without using bedrails? 4  Help needed moving from lying on your back to sitting on the side of a flat bed without using bedrails? 4  Help needed moving to and from a bed to a chair (including a wheelchair)? 4  Help needed standing up from a chair  using your arms (e.g., wheelchair or bedside chair)? 4  Help needed to walk  in hospital room? 3  Help needed climbing 3-5 steps with a railing?  3  6 Click Score 22  Consider Recommendation of Discharge To: Home with no services  PT Recommendation  Follow Up Recommendations No PT follow up (refusing outpatient PT)  PT equipment None recommended by PT  Acute Rehab PT Goals  Patient Stated Goal go home  PT Goal Formulation With patient  Time For Goal Achievement 04/06/19  Potential to Achieve Goals Good  PT Time Calculation  PT Start Time (ACUTE ONLY) 1439  PT Stop Time (ACUTE ONLY) 1455  PT Time Calculation (min) (ACUTE ONLY) 16 min  PT General Charges  $$ ACUTE PT VISIT 1 Visit  PT Evaluation  $PT Eval Low Complexity 1 Low   Patient evaluated by Physical Therapy with no further acute PT needs identified. All education has been completed and the patient has no further questions. Pt with mild instability initially during gait, however, with increased distance, balance improved. Pt reports she feels she is at baseline with mobility and reports she has mild instability at baseline. Was able to perform DGI tasks without LOB. Reports she does not feel she has any need for follow up PT at this time. See below for any follow-up Physical Therapy or equipment needs. PT is signing off. Thank you for this referral. If needs change, please re-consult.   Reuel Derby, PT, DPT  Acute Rehabilitation Services  Pager: [phone] Office: [phone]

## 2019-04-06 NOTE — Evaluation (Signed)
Occupational Therapy Evaluation Patient Details Name: Lisa [name] MRN: [id number] DOB: 1945/03/08 Today's Date: 04/06/2019    History of Present Illness Pt is 74 y/o female presenting with sudden onset of lower extremity weakness more on the left side. MRI shows bil MCA/ACA territory 2-3 punctate infarcts. PMH includes uterine cancer.   Clinical Impression   PTA pt living independently with spouse, able to get out in community and complete all BADL/IADL. At time of eval, pt presents back to baseline level of function. She endorses no lingering symptoms from initial event. No unilateral weakness noted, no visual deficits noted, and pt able to complete functional mobility without assist. She is mildly unsteady on her feet, but states this to be baseline. No further OT needs identified. OT will sign off, thank you for this consult.    Follow Up Recommendations  No OT follow up    Equipment Recommendations  None recommended by OT    Recommendations for Other Services       Precautions / Restrictions Precautions Precautions: None Restrictions Weight Bearing Restrictions: No      Mobility Bed Mobility Overal bed mobility: Independent                Transfers Overall transfer level: Independent                    Balance Overall balance assessment: Mild deficits observed, not formally tested                                         ADL either performed or assessed with clinical judgement   ADL Overall ADL's : At baseline                                       General ADL Comments: Pt is at baseline level of functioning for BADL. She demonstrates ability to complete toilet transfer, LB dressing, and functional mobility without assistance or safety deficits     Vision Baseline Vision/History: No visual deficits Patient Visual Report: No change from baseline Vision Assessment?: Yes;No apparent visual deficits      Perception     Praxis      Pertinent Vitals/Pain Pain Assessment: No/denies pain     Hand Dominance     Extremity/Trunk Assessment Upper Extremity Assessment Upper Extremity Assessment: Overall WFL for tasks assessed   Lower Extremity Assessment Lower Extremity Assessment: Defer to PT evaluation       Communication Communication Communication: No difficulties   Cognition Arousal/Alertness: Awake/alert Behavior During Therapy: WFL for tasks assessed/performed Overall Cognitive Status: Within Functional Limits for tasks assessed                                     General Comments       Exercises     Shoulder Instructions      Home Living Family/patient expects to be discharged to:: Private residence Living Arrangements: Spouse/significant other Available Help at Discharge: Available 24 hours/day Type of Home: House Home Access: Stairs to enter [employer] of Steps: 6 Entrance Stairs-Rails: Right Home Layout: Able to live on main level with bedroom/bathroom;Two level Alternate Level Stairs-Number of Steps: full flight, but mostly stays on main Alternate Level Stairs-Rails:  Left Bathroom Shower/Tub: Occupational psychologist: Handicapped height     Home Equipment: None          Prior Functioning/Environment Level of Independence: Independent        Comments: driving, fully independent in community        OT Problem List: Decreased knowledge of use of DME or AE;Impaired balance (sitting and/or standing);Decreased activity tolerance      OT Treatment/Interventions:      OT Goals(Current goals can be found in the care plan section) Acute Rehab OT Goals Patient Stated Goal: go home OT Goal Formulation: With patient Time For Goal Achievement: 04/20/19 Potential to Achieve Goals: Good  OT Frequency:     Barriers to D/C:            Co-evaluation              AM-PAC OT "6 Clicks" Daily Activity      Outcome Measure Help from another person eating meals?: None Help from another person taking care of personal grooming?: None Help from another person toileting, which includes using toliet, bedpan, or urinal?: None Help from another person bathing (including washing, rinsing, drying)?: None Help from another person to put on and taking off regular upper body clothing?: None Help from another person to put on and taking off regular lower body clothing?: None 6 Click Score: 24   End of Session Nurse Communication: Mobility status  Activity Tolerance: Patient tolerated treatment well Patient left: in bed;with call bell/phone within reach  OT Visit Diagnosis: Other symptoms and signs involving the nervous system (R29.898)                Time: [id number] OT Time Calculation (min): 15 min Charges:  OT General Charges $OT Visit: 1 Visit OT Evaluation $OT Eval Low Complexity: 1 Low  Zenovia Jarred, MSOT, OTR/L Acute Rehabilitation Services [facility] Office Number: [phone] Pager: [phone]  Zenovia Jarred 04/06/2019, 5:24 PM

## 2019-04-06 NOTE — ED Notes (Signed)
Patient ambulated to and from bathroom with a steady gait. 

## 2019-04-06 NOTE — Progress Notes (Signed)
Progress Note    Lisa [name]  [id number] DOB: May 14, 1945  DOA: 04/05/2019 PCP: Cari Caraway, MD    Brief Narrative:   Chief complaint: Dizziness/lower extremity weakness  Medical records reviewed and are as summarized below:  Lisa [name] is very pleasant  74 y.o. female with a past medical history significant for remote uterine cancer presented to the emergency department on March 16 chief complaint acute dizziness and lower extremity weakness.  She was shopping at [facility] suddenly felt weak she did not lose consciousness and was able to "ease herself" to the ground.  EMS was called she was transported to the emergency department.  Work-up in the emergency department included an MRI of the brain revealing multiple punctate foci of acute ischemia within the corona radiata bilaterally no hemorrhage or mass-effect.  She was admitted for further work-up  Assessment/Plan:   Principal Problem:   CVA (cerebral vascular accident) [facility]) Active Problems:   Endometrial cancer ([place])   Mixed stress and urge urinary incontinence   #1.Marland Kitchen  MRI with  corona radiata infarcts embolic secondary to unknown source.  Symptoms have resolved this morning.  CT of the head without acute abnormality.  CTA of the head and neck no LVO, mild right M1/MCA stenosis, small distal supraclinoid right ICA aneurysm, lower extremity Doppler reveals no DVT, LDL is 139, hemoglobin A1c is 5.7.  No history htn -Continue aspirin -Continue Plavix -Continue statin -Follow echo results -Await PT/OT recommendations -Await stroke team recommendations  #2.  Endometrial cancer.  Chart review indicates followed by obgyn.  .   Family Communication/Anticipated D/C date and plan/Code Status   DVT prophylaxis: Lovenox ordered. Code Status: Full Code.  Family Communication: patient Disposition Plan: home when work up complete   Medical Consultants:    Dr. Erlinda Hong neurology   Anti-Infectives:     None  Subjective:   Awake alert no acute distress.  Denies any extremity weakness or dizziness.  Reports symptoms are completely "gone"  Objective:    Vitals:   04/06/19 0630 04/06/19 0757 04/06/19 1000 04/06/19 1313  BP: (!) 111/55 (!) 113/94 137/66 (!) 142/59  Pulse: 92 98 97 91  Resp: (!) 21 17 (!) 23 20  Temp:    98.1 F (36.7 C)  TempSrc:    Oral  SpO2: 95% 99% 97% 98%  Weight:       No intake or output data in the 24 hours ending 04/06/19 1332 Filed Weights   04/05/19 1536  Weight: 77.1 kg    Exam: General: Well-nourished awake alert no acute distress  CV: Regular rate and rhythm no murmur gallop or rub no lower extremity edema Respiratory: No increased work of breathing breath sounds are clear bilaterally I hear no wheeze no rhonchi no crackles Abdomen: Nondistended soft positive bowel sounds throughout nontender to palpation no mass organomegaly noted Musculoskeletal: Joints without swelling/erythema full range of motion ambulated in hall with minimal assist Neuro: Awake alert oriented x3 speech clear facial symmetry tongue midline bilateral grip 5 out of 5 bilateral lower extremity strength 5 out of 5 no pronator drift  Data Reviewed:   I have personally reviewed following labs and imaging studies:  Labs: Labs show the following:   Basic Metabolic Panel: Recent Labs  Lab 04/05/19 1453 04/05/19 1457  NA 139 139  K 4.5 4.3  CL 104 104  CO2 25  --   GLUCOSE 122* 111*  BUN 19 22  CREATININE 1.00 1.10*  CALCIUM 9.0  --  GFR Estimated Creatinine Clearance: 43.8 mL/min (A) (by C-G formula based on SCr of 1.1 mg/dL (H)). Liver Function Tests: Recent Labs  Lab 04/05/19 1453  AST 18  ALT 17  ALKPHOS 72  BILITOT 0.7  PROT 6.5  ALBUMIN 3.6   No results for input(s): LIPASE, AMYLASE in the last 168 hours. No results for input(s): AMMONIA in the last 168 hours. Coagulation profile Recent Labs  Lab 04/05/19 1453  INR 1.0    CBC: Recent  Labs  Lab 04/05/19 1453 04/05/19 1457  WBC 7.5  --   NEUTROABS 4.7  --   HGB 14.1 13.9  HCT 43.2 41.0  MCV 100.0  --   PLT 290  --    Cardiac Enzymes: No results for input(s): CKTOTAL, CKMB, CKMBINDEX, TROPONINI in the last 168 hours. BNP (last 3 results) No results for input(s): PROBNP in the last 8760 hours. CBG: Recent Labs  Lab 04/05/19 1519  GLUCAP 92   D-Dimer: No results for input(s): DDIMER in the last 72 hours. Hgb A1c: Recent Labs    04/06/19 0250  HGBA1C 5.7*   Lipid Profile: Recent Labs    04/06/19 0250  CHOL 211*  HDL 51  LDLCALC 139*  TRIG 107  CHOLHDL 4.1   Thyroid function studies: Recent Labs    04/05/19 1833  TSH 0.923   Anemia work up: No results for input(s): VITAMINB12, FOLATE, FERRITIN, TIBC, IRON, RETICCTPCT in the last 72 hours. Sepsis Labs: Recent Labs  Lab 04/05/19 1453  WBC 7.5    Microbiology Recent Results (from the past 240 hour(s))  SARS CORONAVIRUS 2 (TAT 6-24 HRS) Nasopharyngeal Nasopharyngeal Swab     Status: None   Collection Time: 04/05/19  4:14 PM   Specimen: Nasopharyngeal Swab  Result Value Ref Range Status   SARS Coronavirus 2 NEGATIVE NEGATIVE Final    Comment: (NOTE) SARS-CoV-2 target nucleic acids are NOT DETECTED. The SARS-CoV-2 RNA is generally detectable in upper and lower respiratory specimens during the acute phase of infection. Negative results do not preclude SARS-CoV-2 infection, do not rule out co-infections with other pathogens, and should not be used as the sole basis for treatment or other patient management decisions. Negative results must be combined with clinical observations, patient history, and epidemiological information. The expected result is Negative. Fact Sheet for Patients: SugarRoll.be Fact Sheet for Healthcare Providers: https://www.woods-mathews.com/ This test is not yet approved or cleared by the [place] FDA and  has been  authorized for detection and/or diagnosis of SARS-CoV-2 by FDA under an Emergency Use Authorization (EUA). This EUA will remain  in effect (meaning this test can be used) for the duration of the COVID-19 declaration under Section 56 4(b)(1) of the Act, 21 U.S.C. section 360bbb-3(b)(1), unless the authorization is terminated or revoked sooner. Performed at [facility], [place] [address], [place], [place] [address]     Procedures and diagnostic studies:  CT Code Stroke CTA Head W/WO contrast  Result Date: 04/05/2019 CLINICAL DATA:  Left-sided weakness EXAM: CT ANGIOGRAPHY HEAD AND NECK TECHNIQUE: Multidetector CT imaging of the head and neck was performed using the standard protocol during bolus administration of intravenous contrast. Multiplanar CT image reconstructions and MIPs were obtained to evaluate the vascular anatomy. Carotid stenosis measurements (when applicable) are obtained utilizing NASCET criteria, using the distal internal carotid diameter as the denominator. CONTRAST:  10mL OMNIPAQUE IOHEXOL 350 MG/ML SOLN COMPARISON:  None. FINDINGS: CTA NECK FINDINGS Aortic arch: Great vessel origins are patent. Right carotid system: Patent.  There is eccentric noncalcified plaque along the common carotid causing less than 50% stenosis. No measurable stenosis at the ICA origin. Left carotid system: Patent. Minimal calcified plaque at the ICA origin. No measurable stenosis. Vertebral arteries: Patent and tortuous. Right vertebral artery is dominant. Skeleton: Multilevel degenerative changes of the cervical spine. Other neck: No mass or adenopathy. Upper chest: No apical lung mass. Review of the MIP images confirms the above findings CTA HEAD FINDINGS Anterior circulation: Intracranial internal carotid arteries are patent with minor calcified plaque. Shallow broad-based protrusion along the distal supraclinoid left ICA directed inferiorly measuring approximately 2 x 1 mm. Anterior cerebral arteries  are patent. 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 There is atherosclerotic irregularity and eccentric noncalcified plaque along the right M1 MCA causing mild stenosis. Posterior circulation: Intracranial vertebral arteries are patent. Left vertebral artery becomes diminutive after PICA origin. Basilar artery is patent. Posterior cerebral arteries are patent. There is a large right posterior communicating artery. Venous sinuses: As permitted by contrast timing, patent. Review of the MIP images confirms the above findings IMPRESSION: No large vessel occlusion. No hemodynamically significant stenosis in the neck. 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 Initial results were communicated to Dr. Leonel Ramsay at 3:10 pmon 3/16/2021by text page via the [facility] messaging system. Electronically Signed   By: Macy Mis M.D.   On: 04/05/2019 15:20   CT Code Stroke CTA Neck W/WO contrast  Result Date: 04/05/2019 CLINICAL DATA:  Left-sided weakness EXAM: CT ANGIOGRAPHY HEAD AND NECK TECHNIQUE: Multidetector CT imaging of the head and neck was performed using the standard protocol during bolus administration of intravenous contrast. Multiplanar CT image reconstructions and MIPs were obtained to evaluate the vascular anatomy. Carotid stenosis measurements (when applicable) are obtained utilizing NASCET criteria, using the distal internal carotid diameter as the denominator. CONTRAST:  24mL OMNIPAQUE IOHEXOL 350 MG/ML SOLN COMPARISON:  None. FINDINGS: CTA NECK FINDINGS Aortic arch: Great vessel origins are patent. Right carotid system: Patent. There is eccentric noncalcified plaque along the common carotid causing less than 50% stenosis. No measurable stenosis at the ICA origin. Left carotid system: Patent. Minimal calcified plaque at the ICA origin. No measurable stenosis. Vertebral arteries: Patent and tortuous. Right vertebral artery is dominant.  Skeleton: Multilevel degenerative changes of the cervical spine. Other neck: No mass or adenopathy. Upper chest: No apical lung mass. Review of the MIP images confirms the above findings CTA HEAD FINDINGS Anterior circulation: Intracranial internal carotid arteries are patent with minor calcified plaque. Shallow broad-based protrusion along the distal supraclinoid left ICA directed inferiorly measuring approximately 2 x 1 mm. Anterior cerebral arteries are patent. 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 There is atherosclerotic irregularity and eccentric noncalcified plaque along the right M1 MCA causing mild stenosis. Posterior circulation: Intracranial vertebral arteries are patent. Left vertebral artery becomes diminutive after PICA origin. Basilar artery is patent. Posterior cerebral arteries are patent. There is a large right posterior communicating artery. Venous sinuses: As permitted by contrast timing, patent. Review of the MIP images confirms the above findings IMPRESSION: No large vessel occlusion. No hemodynamically significant stenosis in the neck. 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 Initial results were communicated to Dr. Leonel Ramsay at 3:10 pmon 3/16/2021by text page via the [facility] messaging system. Electronically Signed   By: Macy Mis M.D.   On: 04/05/2019 15:20   MR BRAIN WO CONTRAST  Result Date: 04/05/2019 CLINICAL DATA:  Transient ischemic attack EXAM: MRI HEAD WITHOUT CONTRAST TECHNIQUE: Multiplanar, multiecho pulse sequences of the brain and surrounding structures were obtained without intravenous contrast. COMPARISON:  None. FINDINGS: BRAIN: There are punctate foci of diffusion restriction within the corona radiata bilaterally. Diffuse confluent hyperintense T2-weighted signal within the periventricular, deep and juxtacortical white matter, most commonly due to chronic ischemic microangiopathy. There are multiple  old infarcts, including the anterior left MCA territory and multiple locations within the cerebellum. Advanced generalized atrophy. Midline structures are normal. Left middle cranial fossa arachnoid cyst. VASCULAR: Major flow voids are preserved. Susceptibility-sensitive sequences show no chronic microhemorrhage or superficial siderosis. SKULL AND UPPER CERVICAL SPINE: Normal calvarium and skull base. Visualized upper cervical spine and soft tissues are normal. SINUSES/ORBITS: No fluid levels or advanced mucosal thickening. No mastoid or middle ear effusion. Normal orbits. IMPRESSION: 1. Multiple punctate foci of acute ischemia within the corona radiata bilaterally. No hemorrhage or mass effect. 2. Multiple old infarcts and findings of chronic ischemic microangiopathy. Electronically Signed   By: Ulyses Jarred M.D.   On: 04/05/2019 19:32   CT HEAD CODE STROKE WO CONTRAST  Result Date: 04/05/2019 CLINICAL DATA:  Code stroke.  Left-sided weakness EXAM: CT HEAD WITHOUT CONTRAST TECHNIQUE: Contiguous axial images were obtained from the base of the skull through the vertex without intravenous contrast. COMPARISON:  None. FINDINGS: Brain: There is no acute intracranial hemorrhage, mass effect, or edema. Multiple chronic infarcts are identified including involvement of the left frontal lobe, right parietal lobe, corona radiata bilaterally, left occipital lobe, posteromedial right temporal lobe, and bilateral cerebellar hemispheres. Additional patchy and confluent hypoattenuation in the supratentorial white matter is nonspecific but probably reflects chronic microvascular ischemic changes. Prominence of the ventricles sulci reflects generalized parenchymal volume loss. Vascular: No hyperdense vessel. There is intracranial atherosclerotic calcification at the skull base. Skull: Unremarkable. Sinuses/Orbits: No acute finding. Other: Mastoid air cells are clear. Degenerative changes at the temporomandibular joints. ASPECTS  [facility] Stroke Program Early CT Score) - Ganglionic level infarction (caudate, lentiform nuclei, internal capsule, insula, M1-M3 cortex): 7 - Supraganglionic infarction (M4-M6 cortex): 3 Total score (0-10 with 10 being normal): 10 IMPRESSION: 1. No acute intracranial hemorrhage or evidence of acute infarction. 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 2. ASPECTS is 10 accounting for chronic infarcts. These results were communicated to Dr. Cheral Marker at 3:04 pmon 3/16/2021by text page via the [facility] messaging system. Electronically Signed   By: Macy Mis M.D.   On: 04/05/2019 15:08   VAS [place] LOWER EXTREMITY VENOUS (DVT)  Result Date: 04/06/2019  Lower Venous DVTStudy Indications: Stroke.  Comparison Study: No prior study on file Performing Technologist: Sharion Dove RVS  Examination Guidelines: A complete evaluation includes B-mode imaging, spectral Doppler, color Doppler, and power Doppler as needed of all accessible portions of each vessel. Bilateral testing is considered an integral part of a complete examination. Limited examinations for reoccurring indications may be performed as noted. The reflux portion of the exam is performed with the patient in reverse Trendelenburg.  +---------+---------------+---------+-----------+----------+--------------+ RIGHT    CompressibilityPhasicitySpontaneityPropertiesThrombus Aging +---------+---------------+---------+-----------+----------+--------------+ CFV      Full           Yes      Yes                                 +---------+---------------+---------+-----------+----------+--------------+ SFJ      Full                                                        +---------+---------------+---------+-----------+----------+--------------+  FV Prox  Full                                                        +---------+---------------+---------+-----------+----------+--------------+ FV Mid   Full                                                         +---------+---------------+---------+-----------+----------+--------------+ FV DistalFull                                                        +---------+---------------+---------+-----------+----------+--------------+ PFV      Full                                                        +---------+---------------+---------+-----------+----------+--------------+ POP      Full           Yes      Yes                                 +---------+---------------+---------+-----------+----------+--------------+ PTV      Full                                                        +---------+---------------+---------+-----------+----------+--------------+ PERO     Full                                                        +---------+---------------+---------+-----------+----------+--------------+   +---------+---------------+---------+-----------+----------+--------------+ LEFT     CompressibilityPhasicitySpontaneityPropertiesThrombus Aging +---------+---------------+---------+-----------+----------+--------------+ CFV      Full           Yes      Yes                                 +---------+---------------+---------+-----------+----------+--------------+ SFJ      Full                                                        +---------+---------------+---------+-----------+----------+--------------+ FV Prox  Full                                                        +---------+---------------+---------+-----------+----------+--------------+  FV Mid   Full                                                        +---------+---------------+---------+-----------+----------+--------------+ FV DistalFull                                                        +---------+---------------+---------+-----------+----------+--------------+ PFV      Full                                                         +---------+---------------+---------+-----------+----------+--------------+ POP      Full           Yes      Yes                                 +---------+---------------+---------+-----------+----------+--------------+ PTV      Full                                                        +---------+---------------+---------+-----------+----------+--------------+ PERO     Full                                                        +---------+---------------+---------+-----------+----------+--------------+     Summary: BILATERAL: - No evidence of deep vein thrombosis seen in the lower extremities, bilaterally.   *See table(s) above for measurements and observations.    Preliminary     Medications:   .  stroke: mapping our early stages of recovery book   Does not apply Once  . aspirin EC  81 mg Oral Daily  . atorvastatin  40 mg Oral q1800  . clopidogrel  75 mg Oral Daily  . enoxaparin (LOVENOX) injection  40 mg Subcutaneous Q24H  . sodium chloride flush  3 mL Intravenous Once   Continuous Infusions:   LOS: 0 days   Radene Gunning NP [facility]   How to contact the [facility] Attending or Consulting provider [place] or covering provider during after hours [place], for this patient?  1. Check the care team in [facility] and look for a) attending/consulting [facility] provider listed and b) the [facility] team listed 2. Log into [email or web address] and use [place]'s universal password to access. If you do not have the password, please contact the hospital operator. 3. Locate the [facility] provider you are looking for under [facility] and page to a number that you can be directly reached. 4. If you still have difficulty reaching the provider, please page the [facility] (Director on Call) for the Hospitalists listed on amion for assistance.  04/06/2019, 1:32 PM

## 2019-04-06 NOTE — ED Notes (Signed)
Lunch Tray Ordered @ 1028. 

## 2019-04-06 NOTE — Progress Notes (Signed)
STROKE TEAM PROGRESS NOTE   INTERVAL HISTORY Pt sitting in bed, her husband is at bedside. Pt stated that she went to [facility] yesterday and suddenly felt dizzy and she had to sit down. She felt b/l leg weakness and L>R. Also felt b/l arm flaccid. However, it came back in 30 min. MRI showed b/l MCA/ACA territory 2-3 punctate infarcts. Pt is in agreeable for loop tomorrow.    Vitals:   04/06/19 0441 04/06/19 0630 04/06/19 0757 04/06/19 1000  BP: 125/62 (!) 111/55 (!) 113/94 137/66  Pulse: 90 92 98 97  Resp: (!) 22 (!) 21 17 (!) 23  Temp:      TempSrc:      SpO2: 98% 95% 99% 97%  Weight:        CBC:  Recent Labs  Lab 04/05/19 1453 04/05/19 1457  WBC 7.5  --   NEUTROABS 4.7  --   HGB 14.1 13.9  HCT 43.2 41.0  MCV 100.0  --   PLT 290  --     Basic Metabolic Panel:  Recent Labs  Lab 04/05/19 1453 04/05/19 1457  NA 139 139  K 4.5 4.3  CL 104 104  CO2 25  --   GLUCOSE 122* 111*  BUN 19 22  CREATININE 1.00 1.10*  CALCIUM 9.0  --    Lipid Panel:     Component Value Date/Time   CHOL 211 (H) 04/06/2019 0250   TRIG 107 04/06/2019 0250   HDL 51 04/06/2019 0250   CHOLHDL 4.1 04/06/2019 0250   VLDL 21 04/06/2019 0250   LDLCALC 139 (H) 04/06/2019 0250   HgbA1c:  Lab Results  Component Value Date   HGBA1C 5.7 (H) 04/06/2019   Urine Drug Screen: No results found for: LABOPIA, COCAINSCRNUR, LABBENZ, AMPHETMU, THCU, LABBARB  Alcohol Level No results found for: ETH  IMAGING past 24 hours CT Code Stroke CTA Head W/WO contrast  Result Date: 04/05/2019 CLINICAL DATA:  Left-sided weakness EXAM: CT ANGIOGRAPHY HEAD AND NECK TECHNIQUE: Multidetector CT imaging of the head and neck was performed using the standard protocol during bolus administration of intravenous contrast. Multiplanar CT image reconstructions and MIPs were obtained to evaluate the vascular anatomy. Carotid stenosis measurements (when applicable) are obtained utilizing NASCET criteria, using the distal internal  carotid diameter as the denominator. CONTRAST:  78mL OMNIPAQUE IOHEXOL 350 MG/ML SOLN COMPARISON:  None. FINDINGS: CTA NECK FINDINGS Aortic arch: Great vessel origins are patent. Right carotid system: Patent. There is eccentric noncalcified plaque along the common carotid causing less than 50% stenosis. No measurable stenosis at the ICA origin. Left carotid system: Patent. Minimal calcified plaque at the ICA origin. No measurable stenosis. Vertebral arteries: Patent and tortuous. Right vertebral artery is dominant. Skeleton: Multilevel degenerative changes of the cervical spine. Other neck: No mass or adenopathy. Upper chest: No apical lung mass. Review of the MIP images confirms the above findings CTA HEAD FINDINGS Anterior circulation: Intracranial internal carotid arteries are patent with minor calcified plaque. Shallow broad-based protrusion along the distal supraclinoid left ICA directed inferiorly measuring approximately 2 x 1 mm. Anterior cerebral arteries are patent. 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 There is atherosclerotic irregularity and eccentric noncalcified plaque along the right M1 MCA causing mild stenosis. Posterior circulation: Intracranial vertebral arteries are patent. Left vertebral artery becomes diminutive after PICA origin. Basilar artery is patent. Posterior cerebral arteries are patent. There is a large right posterior communicating artery. Venous sinuses: As permitted by contrast timing, patent. Review of the MIP images confirms  the above findings IMPRESSION: No large vessel occlusion. No hemodynamically significant stenosis in the neck. 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 Initial results were communicated to Dr. Leonel Ramsay at 3:10 pmon 3/16/2021by text page via the [facility] messaging system. Electronically Signed   By: Macy Mis M.D.   On: 04/05/2019 15:20   CT Code Stroke CTA Neck W/WO contrast  Result  Date: 04/05/2019 CLINICAL DATA:  Left-sided weakness EXAM: CT ANGIOGRAPHY HEAD AND NECK TECHNIQUE: Multidetector CT imaging of the head and neck was performed using the standard protocol during bolus administration of intravenous contrast. Multiplanar CT image reconstructions and MIPs were obtained to evaluate the vascular anatomy. Carotid stenosis measurements (when applicable) are obtained utilizing NASCET criteria, using the distal internal carotid diameter as the denominator. CONTRAST:  29mL OMNIPAQUE IOHEXOL 350 MG/ML SOLN COMPARISON:  None. FINDINGS: CTA NECK FINDINGS Aortic arch: Great vessel origins are patent. Right carotid system: Patent. There is eccentric noncalcified plaque along the common carotid causing less than 50% stenosis. No measurable stenosis at the ICA origin. Left carotid system: Patent. Minimal calcified plaque at the ICA origin. No measurable stenosis. Vertebral arteries: Patent and tortuous. Right vertebral artery is dominant. Skeleton: Multilevel degenerative changes of the cervical spine. Other neck: No mass or adenopathy. Upper chest: No apical lung mass. Review of the MIP images confirms the above findings CTA HEAD FINDINGS Anterior circulation: Intracranial internal carotid arteries are patent with minor calcified plaque. Shallow broad-based protrusion along the distal supraclinoid left ICA directed inferiorly measuring approximately 2 x 1 mm. Anterior cerebral arteries are patent. 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 There is atherosclerotic irregularity and eccentric noncalcified plaque along the right M1 MCA causing mild stenosis. Posterior circulation: Intracranial vertebral arteries are patent. Left vertebral artery becomes diminutive after PICA origin. Basilar artery is patent. Posterior cerebral arteries are patent. There is a large right posterior communicating artery. Venous sinuses: As permitted by contrast timing, patent. Review of the MIP images confirms the above  findings IMPRESSION: No large vessel occlusion. No hemodynamically significant stenosis in the neck. 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 Initial results were communicated to Dr. Leonel Ramsay at 3:10 pmon 3/16/2021by text page via the [facility] messaging system. Electronically Signed   By: Macy Mis M.D.   On: 04/05/2019 15:20   MR BRAIN WO CONTRAST  Result Date: 04/05/2019 CLINICAL DATA:  Transient ischemic attack EXAM: MRI HEAD WITHOUT CONTRAST TECHNIQUE: Multiplanar, multiecho pulse sequences of the brain and surrounding structures were obtained without intravenous contrast. COMPARISON:  None. FINDINGS: BRAIN: There are punctate foci of diffusion restriction within the corona radiata bilaterally. Diffuse confluent hyperintense T2-weighted signal within the periventricular, deep and juxtacortical white matter, most commonly due to chronic ischemic microangiopathy. There are multiple old infarcts, including the anterior left MCA territory and multiple locations within the cerebellum. Advanced generalized atrophy. Midline structures are normal. Left middle cranial fossa arachnoid cyst. VASCULAR: Major flow voids are preserved. Susceptibility-sensitive sequences show no chronic microhemorrhage or superficial siderosis. SKULL AND UPPER CERVICAL SPINE: Normal calvarium and skull base. Visualized upper cervical spine and soft tissues are normal. SINUSES/ORBITS: No fluid levels or advanced mucosal thickening. No mastoid or middle ear effusion. Normal orbits. IMPRESSION: 1. Multiple punctate foci of acute ischemia within the corona radiata bilaterally. No hemorrhage or mass effect. 2. Multiple old infarcts and findings of chronic ischemic microangiopathy. Electronically Signed  By: Ulyses Jarred M.D.   On: 04/05/2019 19:32   CT HEAD CODE STROKE WO CONTRAST  Result Date: 04/05/2019 CLINICAL DATA:  Code stroke.  Left-sided weakness EXAM: CT  HEAD WITHOUT CONTRAST TECHNIQUE: Contiguous axial images were obtained from the base of the skull through the vertex without intravenous contrast. COMPARISON:  None. FINDINGS: Brain: There is no acute intracranial hemorrhage, mass effect, or edema. Multiple chronic infarcts are identified including involvement of the left frontal lobe, right parietal lobe, corona radiata bilaterally, left occipital lobe, posteromedial right temporal lobe, and bilateral cerebellar hemispheres. Additional patchy and confluent hypoattenuation in the supratentorial white matter is nonspecific but probably reflects chronic microvascular ischemic changes. Prominence of the ventricles sulci reflects generalized parenchymal volume loss. Vascular: No hyperdense vessel. There is intracranial atherosclerotic calcification at the skull base. Skull: Unremarkable. Sinuses/Orbits: No acute finding. Other: Mastoid air cells are clear. Degenerative changes at the temporomandibular joints. ASPECTS [facility] Stroke Program Early CT Score) - Ganglionic level infarction (caudate, lentiform nuclei, internal capsule, insula, M1-M3 cortex): 7 - Supraganglionic infarction (M4-M6 cortex): 3 Total score (0-10 with 10 being normal): 10 IMPRESSION: 1. No acute intracranial hemorrhage or evidence of acute infarction. 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 2. ASPECTS is 10 accounting for chronic infarcts. These results were communicated to Dr. Cheral Marker at 3:04 pmon 3/16/2021by text page via the [facility] messaging system. Electronically Signed   By: Macy Mis M.D.   On: 04/05/2019 15:08   VAS [place] LOWER EXTREMITY VENOUS (DVT)  Result Date: 04/06/2019  Lower Venous DVTStudy Indications: Stroke.  Comparison Study: No prior study on file Performing Technologist: Sharion Dove RVS  Examination Guidelines: A complete evaluation includes B-mode imaging, spectral Doppler, color Doppler, and power Doppler as needed of all accessible  portions of each vessel. Bilateral testing is considered an integral part of a complete examination. Limited examinations for reoccurring indications may be performed as noted. The reflux portion of the exam is performed with the patient in reverse Trendelenburg.  +---------+---------------+---------+-----------+----------+--------------+ RIGHT    CompressibilityPhasicitySpontaneityPropertiesThrombus Aging +---------+---------------+---------+-----------+----------+--------------+ CFV      Full           Yes      Yes                                 +---------+---------------+---------+-----------+----------+--------------+ SFJ      Full                                                        +---------+---------------+---------+-----------+----------+--------------+ FV Prox  Full                                                        +---------+---------------+---------+-----------+----------+--------------+ FV Mid   Full                                                        +---------+---------------+---------+-----------+----------+--------------+ FV DistalFull                                                        +---------+---------------+---------+-----------+----------+--------------+  PFV      Full                                                        +---------+---------------+---------+-----------+----------+--------------+ POP      Full           Yes      Yes                                 +---------+---------------+---------+-----------+----------+--------------+ PTV      Full                                                        +---------+---------------+---------+-----------+----------+--------------+ PERO     Full                                                        +---------+---------------+---------+-----------+----------+--------------+   +---------+---------------+---------+-----------+----------+--------------+ LEFT      CompressibilityPhasicitySpontaneityPropertiesThrombus Aging +---------+---------------+---------+-----------+----------+--------------+ CFV      Full           Yes      Yes                                 +---------+---------------+---------+-----------+----------+--------------+ SFJ      Full                                                        +---------+---------------+---------+-----------+----------+--------------+ FV Prox  Full                                                        +---------+---------------+---------+-----------+----------+--------------+ FV Mid   Full                                                        +---------+---------------+---------+-----------+----------+--------------+ FV DistalFull                                                        +---------+---------------+---------+-----------+----------+--------------+ PFV      Full                                                        +---------+---------------+---------+-----------+----------+--------------+  POP      Full           Yes      Yes                                 +---------+---------------+---------+-----------+----------+--------------+ PTV      Full                                                        +---------+---------------+---------+-----------+----------+--------------+ PERO     Full                                                        +---------+---------------+---------+-----------+----------+--------------+     Summary: BILATERAL: - No evidence of deep vein thrombosis seen in the lower extremities, bilaterally.   *See table(s) above for measurements and observations.    Preliminary     PHYSICAL EXAM  Temp:  [98.1 F (36.7 C)-98.3 F (36.8 C)] 98.3 F (36.8 C) (03/17 1522) Pulse Rate:  [77-98] 96 (03/17 1522) Resp:  [14-24] 16 (03/17 1522) BP: (107-147)/(47-97) 139/66 (03/17 1522) SpO2:  [95 %-99 %] 97 % (03/17 1522)  General  - Well nourished, well developed, in no apparent distress.  Ophthalmologic - fundi not visualized due to noncooperation.  Cardiovascular - Regular rhythm and rate.  Mental Status -  Level of arousal and orientation to time, place, and person were intact. Language including expression, naming, repetition, comprehension was assessed and found intact. Fund of Knowledge was assessed and was intact.  Cranial Nerves II - XII - II - Visual field intact OU. III, IV, VI - Extraocular movements intact. V - Facial sensation intact bilaterally. VII - Facial movement intact bilaterally. VIII - Hearing & vestibular intact bilaterally. X - Palate elevates symmetrically. XI - Chin turning & shoulder shrug intact bilaterally. XII - Tongue protrusion intact.  Motor Strength - The patient's strength was normal in all extremities and pronator drift was absent.  Bulk was normal and fasciculations were absent.   Motor Tone - Muscle tone was assessed at the neck and appendages and was normal.  Reflexes - The patient's reflexes were symmetrical in all extremities and she had no pathological reflexes.  Sensory - Light touch, temperature/pinprick were assessed and were symmetrical.    Coordination - The patient had normal movements in the hands and feet with no ataxia or dysmetria.  Tremor was absent.  Gait and Station - deferred.   ASSESSMENT/PLAN [name] is a 74 y.o. female with history of uterine cancer presenting with transient dizziness, L>R leg weakness and B arm weakness while walking in [employer].   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 ASPECTS 10.     CTA head & neck no LVO. Mild R M1/MCA stenosis. Small distal supraclinoid R ICA aneurysm.   MRI  3-4 punctate B MCA/ACA watershed infarcts. Multiple old infarct. Chronic ischemic microangiopathy.  LE Doppler  No DVT  2D Echo EF  65-70%  Will do loop tomorrow before discharge   LDL 139  HgbA1c 5.7  Lovenox 40 mg sq daily for VTE prophylaxis  No antithrombotic prior to admission, now on aspirin 81 mg daily and clopidogrel 75 mg daily. Continue DAPT x 3 weeks then aspirin alone.    Therapy recommendations:  pending   Disposition:  pending   Blood Pressure  Elevated BP per EMS  Home meds:  None, no hx HTN . Permissive hypertension (OK if < 220/120) but gradually normalize in 5-7 days  Hyperlipidemia  Home meds:  No statin  Now on lipitor 40  LDL 139, goal < 70  Continue statin at discharge  Other Stroke Risk Factors  Advanced age  ETOH use, advised to drink no more than 1 drink(s) a day  Overweight, Body mass index is 29.18 kg/m., recommend weight loss, diet and exercise as appropriate   Family hx stroke (mother)  Other [facility] day # 0  Rosalin Hawking, MD PhD Stroke Neurology 04/06/2019 4:57 PM    To contact Stroke Continuity provider, please refer to [email or web address] After hours, contact General Neurology

## 2019-04-06 NOTE — Progress Notes (Signed)
VASCULAR LAB PRELIMINARY  PRELIMINARY  PRELIMINARY  PRELIMINARY  Bilateral lower extremity venous duplex completed.    Preliminary report:  See CV proc for preliminary results.   Latresa Gasser, RVT 04/06/2019, 9:00 AM

## 2019-04-07 ENCOUNTER — Encounter (HOSPITAL_COMMUNITY)
Admission: EM | Disposition: A | Discharge status: Home / Self Care | Payer: Self-pay | Source: Home / Self Care | Attending: Family Medicine

## 2019-04-07 DIAGNOSIS — I639 Cerebral infarction, unspecified: Secondary | ICD-10-CM | POA: Diagnosis not present

## 2019-04-07 DIAGNOSIS — R4182 Altered mental status, unspecified: Secondary | ICD-10-CM | POA: Diagnosis present

## 2019-04-07 DIAGNOSIS — Z823 Family history of stroke: Secondary | ICD-10-CM | POA: Diagnosis not present

## 2019-04-07 DIAGNOSIS — Z79899 Other long term (current) drug therapy: Secondary | ICD-10-CM | POA: Diagnosis not present

## 2019-04-07 DIAGNOSIS — I63413 Cerebral infarction due to embolism of bilateral middle cerebral arteries: Secondary | ICD-10-CM | POA: Diagnosis present

## 2019-04-07 DIAGNOSIS — Z20822 Contact with and (suspected) exposure to covid-19: Secondary | ICD-10-CM | POA: Diagnosis present

## 2019-04-07 DIAGNOSIS — C541 Malignant neoplasm of endometrium: Secondary | ICD-10-CM | POA: Diagnosis present

## 2019-04-07 DIAGNOSIS — Z7982 Long term (current) use of aspirin: Secondary | ICD-10-CM | POA: Diagnosis not present

## 2019-04-07 DIAGNOSIS — Z8249 Family history of ischemic heart disease and other diseases of the circulatory system: Secondary | ICD-10-CM | POA: Diagnosis not present

## 2019-04-07 DIAGNOSIS — N3946 Mixed incontinence: Secondary | ICD-10-CM | POA: Diagnosis present

## 2019-04-07 DIAGNOSIS — E785 Hyperlipidemia, unspecified: Secondary | ICD-10-CM | POA: Diagnosis present

## 2019-04-07 HISTORY — PX: LOOP RECORDER INSERTION: EP1214

## 2019-04-07 LAB — BASIC METABOLIC PANEL
Anion gap: 9 (ref 5–15)
BUN: 25 mg/dL — ABNORMAL HIGH (ref 8–23)
CO2: 26 mmol/L (ref 22–32)
Calcium: 8.5 mg/dL — ABNORMAL LOW (ref 8.9–10.3)
Chloride: 105 mmol/L (ref 98–111)
Creatinine, Ser: 0.93 mg/dL (ref 0.44–1.00)
GFR calc Af Amer: >60 mL/min (ref >60)
GFR calc non Af Amer: 60 mL/min — ABNORMAL LOW (ref >60)
Glucose, Bld: 111 mg/dL — ABNORMAL HIGH (ref 70–99)
Potassium: 3.8 mmol/L (ref 3.5–5.1)
Sodium: 140 mmol/L (ref 135–145)

## 2019-04-07 SURGERY — LOOP RECORDER INSERTION

## 2019-04-07 MED ORDER — CLOPIDOGREL BISULFATE 75 MG PO TABS: 75.0000 mg | ORAL_TABLET | Freq: Every day | ORAL | 0 refills | Status: AC

## 2019-04-07 MED ORDER — LIDOCAINE-EPINEPHRINE 1 %-1:100000 IJ SOLN
INTRAMUSCULAR | Status: DC | PRN
  Administered 2019-04-07: 5 mL

## 2019-04-07 MED ORDER — ATORVASTATIN CALCIUM 40 MG PO TABS: 40.0000 mg | ORAL_TABLET | Freq: Every day | ORAL | 0 refills | Status: DC

## 2019-04-07 MED ORDER — LIDOCAINE-EPINEPHRINE 1 %-1:100000 IJ SOLN
INTRAMUSCULAR | Status: AC
  Filled 2019-04-07: qty 1

## 2019-04-07 MED ORDER — ASPIRIN 81 MG PO TBEC: 81.0000 mg | DELAYED_RELEASE_TABLET | Freq: Every day | ORAL | Status: DC

## 2019-04-07 MED ORDER — STROKE: EARLY STAGES OF RECOVERY BOOK: 1.0000 | Freq: Once | Status: AC

## 2019-04-07 MED FILL — CLOPIDOGREL 75 MG TABLET: 75 | 19 days supply | Qty: 19 | Fill #0

## 2019-04-07 MED FILL — ATORVASTATIN CALCIUM 40 MG: 40 | 30 days supply | Qty: 30 | Fill #0

## 2019-04-07 SURGICAL SUPPLY — 2 items
MONITOR REVEAL LINQ II (Prosthesis & Implant Heart) ×3 IMPLANT
PACK LOOP INSERTION (CUSTOM PROCEDURE TRAY) ×3 IMPLANT

## 2019-04-07 NOTE — Progress Notes (Signed)
RN gave pt and her husband discharge instructions they both stated understanding 2 new medications were dropped off by [facility] pharmacy. IV has been removed husband packed all her belongings.

## 2019-04-07 NOTE — Progress Notes (Signed)
STROKE TEAM PROGRESS NOTE   INTERVAL HISTORY Pt sitting in bed, her husband is at bedside. Pt stated that she went to [facility] yesterday and suddenly felt dizzy and she had to sit down. She felt b/l leg weakness and L>R. Also felt b/l arm flaccid. However, it came back in 30 min. MRI showed b/l MCA/ACA territory 2-3 punctate infarcts. Pt is in agreeable for loop sometime today.   Vitals:   04/06/19 1746 04/06/19 1949 04/06/19 2328 04/07/19 0348  BP: 104/65 123/61 139/82 118/73  Pulse: 93 96 91 91  Resp: 18     Temp: 98.4 F (36.9 C) 98.5 F (36.9 C) 97.8 F (36.6 C) 97.6 F (36.4 C)  TempSrc: Oral Oral Oral Oral  SpO2: 98% 97% 95% 96%  Weight:        CBC:  Recent Labs  Lab 04/05/19 1453 04/05/19 1457  WBC 7.5  --   NEUTROABS 4.7  --   HGB 14.1 13.9  HCT 43.2 41.0  MCV 100.0  --   PLT 290  --     Basic Metabolic Panel:  Recent Labs  Lab 04/05/19 1453 04/05/19 1453 04/05/19 1457 04/07/19 0532  NA 139   < > 139 140  K 4.5   < > 4.3 3.8  CL 104   < > 104 105  CO2 25  --   --  26  GLUCOSE 122*   < > 111* 111*  BUN 19   < > 22 25*  CREATININE 1.00   < > 1.10* 0.93  CALCIUM 9.0  --   --  8.5*   < > = values in this interval not displayed.   Lipid Panel:     Component Value Date/Time   CHOL 211 (H) 04/06/2019 0250   TRIG 107 04/06/2019 0250   HDL 51 04/06/2019 0250   CHOLHDL 4.1 04/06/2019 0250   VLDL 21 04/06/2019 0250   LDLCALC 139 (H) 04/06/2019 0250   HgbA1c:  Lab Results  Component Value Date   HGBA1C 5.7 (H) 04/06/2019   Urine Drug Screen: No results found for: LABOPIA, COCAINSCRNUR, LABBENZ, AMPHETMU, THCU, LABBARB  Alcohol Level No results found for: ETH  IMAGING past 24 hours No results found.  PHYSICAL EXAM  Temp:  [97.6 F (36.4 C)-98.5 F (36.9 C)] 97.6 F (36.4 C) (03/18 0348) Pulse Rate:  [89-96] 91 (03/18 0348) Resp:  [16-24] 18 (03/17 1746) BP: (104-147)/(51-82) 118/73 (03/18 0348) SpO2:  [95 %-98 %] 96 % (03/18 0348)  General -  Well nourished, well developed, in no apparent distress.  Ophthalmologic - fundi not visualized due to noncooperation.  Cardiovascular - Regular rhythm and rate.  Mental Status -  Level of arousal and orientation to time, place, and person were intact. Language including expression, naming, repetition, comprehension was assessed and found intact. Fund of Knowledge was assessed and was intact.  Cranial Nerves II - XII - II - Visual field intact OU. III, IV, VI - Extraocular movements intact. V - Facial sensation intact bilaterally. VII - Facial movement intact bilaterally. VIII - Hearing & vestibular intact bilaterally. X - Palate elevates symmetrically. XI - Chin turning & shoulder shrug intact bilaterally. XII - Tongue protrusion intact.  Motor Strength - The patient's strength was normal in all extremities and pronator drift was absent.  Bulk was normal and fasciculations were absent.   Motor Tone - Muscle tone was assessed at the neck and appendages and was normal.  Reflexes - The patient's reflexes were symmetrical  in all extremities and she had no pathological reflexes.  Sensory - Light touch, temperature/pinprick were assessed and were symmetrical.    Coordination - The patient had normal movements in the hands and feet with no ataxia or dysmetria.  Tremor was absent.  Gait and Station - deferred.   ASSESSMENT/PLAN [name] is a 74 y.o. female with history of uterine cancer presenting with transient dizziness, L>R leg weakness and B arm weakness while walking in [employer].   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 ASPECTS 10.     CTA head & neck no LVO. Mild R M1/MCA stenosis. Small distal supraclinoid R ICA aneurysm.   MRI  3-4 punctate B MCA/ACA watershed infarcts. Multiple old infarct. Chronic ischemic microangiopathy.  LE Doppler  No DVT  2D Echo EF  65-70%  Will do loop tomorrow before discharge, stroke will sign off  LDL 139  HgbA1c 5.7  Lovenox 40 mg sq daily for VTE prophylaxis  No antithrombotic prior to admission, now on aspirin 81 mg daily and clopidogrel 75 mg daily. Continue DAPT x 3 weeks then aspirin alone.    Therapy recommendations:  pending   Disposition:  pending   Blood Pressure  Elevated BP per EMS  Home meds:  None, no hx HTN . Permissive hypertension (OK if < 220/120) but gradually normalize in 5-7 days  Hyperlipidemia  Home meds:  No statin  Now on lipitor 40  LDL 139, goal < 70  Continue statin at discharge  Other Stroke Risk Factors  Advanced age  ETOH use, advised to drink no more than 1 drink(s) a day  Overweight, Body mass index is 29.18 kg/m., recommend weight loss, diet and exercise as appropriate   Family hx stroke (mother)  Other [facility] day # 0  Stroke will sign off at this time.  Personally examined patient and images, and have participated in and made any corrections needed to history, physical, neuro exam,assessment and plan as stated above.  I have personally obtained the history, evaluated lab date, reviewed imaging studies and agree with radiology interpretations.   A total of 15 minutes was spent for the care of this patient, spent on counseling patient and family on different diagnostic and therapeutic options, counseling and coordination of care, riskd ans benefits of management, compliance, or risk factor reduction and education.    Sarina Ill MD Stroke Neurology 04/07/2019 11:49 AM    To contact Stroke Continuity provider, please refer to [email or web address] After hours, contact General Neurology

## 2019-04-07 NOTE — Consult Note (Addendum)
ELECTROPHYSIOLOGY CONSULT NOTE  Patient ID: Lisa [name] MRN: [id number], DOB/AGE: 1945/02/01   Admit date: 04/05/2019 Date of Consult: 04/07/2019  Primary Physician: Cari Caraway, MD Primary Cardiologist: none Reason for Consultation: Cryptogenic stroke ; recommendations regarding Implantable Loop Recorder, requested by Dr. Erlinda Hong  History of Present Illness [name] was admitted on 04/05/2019 with stroke.  PMHx includes uterine Ca only  Neurology noted:3-4 punctate bilateral MCA/ACA watershed infarcts embolic secondary to unknown source .  she has undergone workup for stroke including echocardiogram and carotid angio.  The patient has been monitored on telemetry which has demonstrated sinus rhythm with no arrhythmias.  Neurology has deferred TEE  Echocardiogram this admission demonstrated IMPRESSIONS  1. Left ventricular ejection fraction, by estimation, is 65 to 70%. The  left ventricle has normal function. The left ventricle has no regional  wall motion abnormalities. Left ventricular diastolic parameters are  indeterminate.  2. Right ventricular systolic function is normal. The right ventricular  size is normal. Tricuspid regurgitation signal is inadequate for assessing  PA pressure.  3. The mitral valve is normal in structure. No evidence of mitral valve  regurgitation.  4. The aortic valve was not well visualized. Aortic valve regurgitation  is not visualized. No aortic stenosis is present.  5. The inferior vena cava is normal in size with greater than 50%  respiratory variability, suggesting right atrial pressure of 3 mmHg.     Lab work is reviewed.  Prior to admission, the patient denies chest pain, shortness of breath, dizziness, palpitations, or syncope.  They are recovering from their stroke with plans to home at discharge.    Past Medical History:  Diagnosis Date  . Stroke ([place]) 04/06/2019  . Uterine cancer ([place]) 01/25/2016      Surgical History:  Past Surgical History:  Procedure Laterality Date  . BREAST EXCISIONAL BIOPSY Left 1975  . TUBAL LIGATION  1970     Medications Prior to Admission  Medication Sig Dispense Refill Last Dose  . betamethasone dipropionate (DIPROLENE) 0.05 % ointment Apply 1 application topically daily.   04/04/2019 at Unknown time  . Cholecalciferol (VITAMIN D3 PO) Take 25 mg by mouth daily.   04/04/2019 at Unknown time  . fexofenadine (ALLEGRA) 180 MG tablet Take 180 mg by mouth daily.   04/05/2019 at Unknown time  . Magnesium Oxide 400 MG CAPS Take 1 capsule by mouth daily.   04/04/2019 at Unknown time  . Multiple Vitamins-Minerals (CENTRUM SILVER 50+WOMEN PO) Take 1 tablet by mouth daily.   04/04/2019 at Unknown time  . SUPER B COMPLEX/C PO Take 1 tablet by mouth daily.   04/04/2019 at Unknown time    Inpatient Medications:  .  stroke: mapping our early stages of recovery book   Does not apply Once  . aspirin EC  81 mg Oral Daily  . atorvastatin  40 mg Oral q1800  . clopidogrel  75 mg Oral Daily  . enoxaparin (LOVENOX) injection  40 mg Subcutaneous Q24H  . sodium chloride flush  3 mL Intravenous Once    Allergies:  Allergies  Allergen Reactions  . Codeine Nausea And Vomiting  . Tylenol [Acetaminophen] Hives    Social History   Socioeconomic History  . Marital status: Married    Spouse name: Not on file  . Number of children: Not on file  . Years of education: Not on file  . Highest education level: Not on file  Occupational History  . Not on file  Tobacco Use  .  Smoking status: Never Smoker  . Smokeless tobacco: Never Used  Substance and Sexual Activity  . Alcohol use: Yes    Comment: occasional drinker  . Drug use: Never  . Sexual activity: Not on file  Other Topics Concern  . Not on file  Social History Narrative  . Not on file   Social Determinants of Health   Financial Resource Strain:   . Difficulty of Paying Living Expenses:   Food Insecurity:   .  Worried About Charity fundraiser in the Last Year:   . Arboriculturist in the Last Year:   Transportation Needs:   . Film/video editor (Medical):   Marland Kitchen Lack of Transportation (Non-Medical):   Physical Activity:   . Days of Exercise per Week:   . Minutes of Exercise per Session:   Stress:   . Feeling of Stress :   Social Connections:   . Frequency of Communication with Friends and Family:   . Frequency of Social Gatherings with Friends and Family:   . Attends Religious Services:   . Active Member of Clubs or Organizations:   . Attends Archivist Meetings:   Marland Kitchen Marital Status:   Intimate Partner Violence:   . Fear of Current or Ex-Partner:   . Emotionally Abused:   Marland Kitchen Physically Abused:   . Sexually Abused:      Family History  Problem Relation Age of Onset  . Hypertension Mother   . Stroke Mother   . Hypertension Father       Review of Systems: All other systems reviewed and are otherwise negative except as noted above.  Physical Exam: Vitals:   04/06/19 1949 04/06/19 2328 04/07/19 0348 04/07/19 1241  BP: 123/61 139/82 118/73 116/62  Pulse: 96 91 91 88  Resp:    18  Temp: 98.5 F (36.9 C) 97.8 F (36.6 C) 97.6 F (36.4 C) 98.7 F (37.1 C)  TempSrc: Oral Oral Oral Oral  SpO2: 97% 95% 96% 96%  Weight:        GEN- The patient is well appearing, alert and oriented x 3 today.   Head- normocephalic, atraumatic Eyes-  Sclera clear, conjunctiva pink Ears- hearing intact Oropharynx- clear Neck- supple Lungs- CTA b/l, normal work of breathing Heart- RRR, no murmurs, rubs or gallops  GI- soft, NT, ND Extremities- no clubbing, cyanosis, or edema MS- no significant deformity or atrophy Skin- no rash or lesion Psych- euthymic mood, full affect   Labs:   Lab Results  Component Value Date   WBC 7.5 04/05/2019   HGB 13.9 04/05/2019   HCT 41.0 04/05/2019   MCV 100.0 04/05/2019   PLT 290 04/05/2019    Recent Labs  Lab 04/05/19 1453 04/05/19 1457  04/07/19 0532  NA 139   < > 140  K 4.5   < > 3.8  CL 104   < > 105  CO2 25   < > 26  BUN 19   < > 25*  CREATININE 1.00   < > 0.93  CALCIUM 9.0   < > 8.5*  PROT 6.5  --   --   BILITOT 0.7  --   --   ALKPHOS 72  --   --   ALT 17  --   --   AST 18  --   --   GLUCOSE 122*   < > 111*   < > = values in this interval not displayed.   No results found for: CKTOTAL,  CKMB, CKMBINDEX, TROPONINI Lab Results  Component Value Date   CHOL 211 (H) 04/06/2019   Lab Results  Component Value Date   HDL 51 04/06/2019   Lab Results  Component Value Date   LDLCALC 139 (H) 04/06/2019   Lab Results  Component Value Date   TRIG 107 04/06/2019   Lab Results  Component Value Date   CHOLHDL 4.1 04/06/2019   No results found for: LDLDIRECT  No results found for: DDIMER   Radiology/Studies:   CT Code Stroke CTA Head W/WO contrast Result Date: 04/05/2019 CLINICAL DATA:  Left-sided weakness EXAM: CT ANGIOGRAPHY HEAD AND NECK TECHNIQUE: Multidetector CT imaging of the head and neck was performed using the standard protocol during bolus administration of intravenous contrast. Multiplanar CT image reconstructions and MIPs were obtained to evaluate the vascular anatomy. Carotid stenosis measurements (when applicable) are obtained utilizing NASCET criteria, using the distal internal carotid diameter as the denominator. CONTRAST:  15mL OMNIPAQUE IOHEXOL 350 MG/ML SOLN COMPARISON:  None. FINDINGS: CTA NECK FINDINGS Aortic arch: Great vessel origins are patent. Right carotid system: Patent. There is eccentric noncalcified plaque along the common carotid causing less than 50% stenosis. No measurable stenosis at the ICA origin. Left carotid system: Patent. Minimal calcified plaque at the ICA origin. No measurable stenosis. Vertebral arteries: Patent and tortuous. Right vertebral artery is dominant. Skeleton: Multilevel degenerative changes of the cervical spine. Other neck: No mass or adenopathy. Upper chest: No  apical lung mass. Review of the MIP images confirms the above findings CTA HEAD FINDINGS Anterior circulation: Intracranial internal carotid arteries are patent with minor calcified plaque. Shallow broad-based protrusion along the distal supraclinoid left ICA directed inferiorly measuring approximately 2 x 1 mm. Anterior cerebral arteries are patent. 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 There is atherosclerotic irregularity and eccentric noncalcified plaque along the right M1 MCA causing mild stenosis. Posterior circulation: Intracranial vertebral arteries are patent. Left vertebral artery becomes diminutive after PICA origin. Basilar artery is patent. Posterior cerebral arteries are patent. There is a large right posterior communicating artery. Venous sinuses: As permitted by contrast timing, patent. Review of the MIP images confirms the above findings IMPRESSION: No large vessel occlusion. No hemodynamically significant stenosis in the neck. 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 Initial results were communicated to Dr. Leonel Ramsay at 3:10 pmon 3/16/2021by text page via the [facility] messaging system. Electronically Signed   By: Macy Mis M.D.   On: 04/05/2019 15:20     MR BRAIN WO CONTRAST Result Date: 04/05/2019 CLINICAL DATA:  Transient ischemic attack EXAM: MRI HEAD WITHOUT CONTRAST TECHNIQUE: Multiplanar, multiecho pulse sequences of the brain and surrounding structures were obtained without intravenous contrast. COMPARISON:  None. FINDINGS: BRAIN: There are punctate foci of diffusion restriction within the corona radiata bilaterally. Diffuse confluent hyperintense T2-weighted signal within the periventricular, deep and juxtacortical white matter, most commonly due to chronic ischemic microangiopathy. There are multiple old infarcts, including the anterior left MCA territory and multiple locations within the cerebellum. Advanced  generalized atrophy. Midline structures are normal. Left middle cranial fossa arachnoid cyst. VASCULAR: Major flow voids are preserved. Susceptibility-sensitive sequences show no chronic microhemorrhage or superficial siderosis. SKULL AND UPPER CERVICAL SPINE: Normal calvarium and skull base. Visualized upper cervical spine and soft tissues are normal. SINUSES/ORBITS: No fluid levels or advanced mucosal thickening. No mastoid or middle ear effusion. Normal orbits. IMPRESSION: 1. Multiple punctate foci of acute ischemia within the corona radiata bilaterally.  No hemorrhage or mass effect. 2. Multiple old infarcts and findings of chronic ischemic microangiopathy. Electronically Signed   By: Ulyses Jarred M.D.   On: 04/05/2019 19:32     CT HEAD CODE STROKE WO CONTRAST Result Date: 04/05/2019 CLINICAL DATA:  Code stroke.  Left-sided weakness EXAM: CT HEAD WITHOUT CONTRAST TECHNIQUE: Contiguous axial images were obtained from the base of the skull through the vertex without intravenous contrast. COMPARISON:  None. FINDINGS: Brain: There is no acute intracranial hemorrhage, mass effect, or edema. Multiple chronic infarcts are identified including involvement of the left frontal lobe, right parietal lobe, corona radiata bilaterally, left occipital lobe, posteromedial right temporal lobe, and bilateral cerebellar hemispheres. Additional patchy and confluent hypoattenuation in the supratentorial white matter is nonspecific but probably reflects chronic microvascular ischemic changes. Prominence of the ventricles sulci reflects generalized parenchymal volume loss. Vascular: No hyperdense vessel. There is intracranial atherosclerotic calcification at the skull base. Skull: Unremarkable. Sinuses/Orbits: No acute finding. Other: Mastoid air cells are clear. Degenerative changes at the temporomandibular joints. ASPECTS [facility] Stroke Program Early CT Score) - Ganglionic level infarction (caudate, lentiform nuclei, internal  capsule, insula, M1-M3 cortex): 7 - Supraganglionic infarction (M4-M6 cortex): 3 Total score (0-10 with 10 being normal): 10 IMPRESSION: 1. No acute intracranial hemorrhage or evidence of acute infarction. 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 2. ASPECTS is 10 accounting for chronic infarcts. These results were communicated to Dr. Cheral Marker at 3:04 pmon 3/16/2021by text page via the [facility] messaging system. Electronically Signed   By: Macy Mis M.D.   On: 04/05/2019 15:08     VAS [place] LOWER EXTREMITY VENOUS (DVT) Result Date: 04/06/2019  Lower Venous DVTStudy Indications: Stroke.  Comparison Study: No prior study on file Performing Technologist: Sharion Dove RVS  Examination Guidelines: A complete evaluation includes B-mode imaging, spectral Doppler, color Doppler, and power Doppler as needed of all accessible portions of each vessel. Bilateral testing is considered an integral part of a complete examination. Limited examinations for reoccurring indications may be performed as noted. The reflux portion of the exam is performed with the patient in reverse Trendelenburg.                                       Summary: BILATERAL: - No evidence of deep vein thrombosis seen in the lower extremities, bilaterally.   *See table(s) above for measurements and observations. Electronically signed by Curt Jews MD on 04/06/2019 at 3:53:50 PM.    Final     12-lead ECG SR All prior EKG's in EPIC reviewed with no documented atrial fibrillation  Telemetry SR  Assessment and Plan:  1. Cryptogenic stroke The patient presents with cryptogenic stroke.  The patient has a TEE planned for this AM.  I spoke at length with the patient about monitoring for afib with either a 30 day event monitor or an implantable loop recorder.  Risks, benefits, and alteratives to implantable loop recorder were discussed with the patient today.   At this time, the patient is very clear in their decision to  proceed with implantable loop recorder.   Wound care was reviewed with the patient (keep incision clean and dry for 3 days).  Wound check will be scheduled for the patient  Please call with questions.  Renee Dyane Dustman, PA-C 04/07/2019   EP Attending  Patient seen and examined. Agree with the findings as noted above. The patient has  sustained a cryptogenic stroke. She has no h/o atrial fib. I have discussed the indications/risks/benefits/goals/expectations of ILR insertion and she wishes to proceed.  Mikle Bosworth.D.

## 2019-04-07 NOTE — Discharge Summary (Deleted)
Physician Discharge Summary  Lisa [name] [id number] DOB: Jan 28, 1945 DOA: 04/05/2019  PCP: Cari Caraway, MD  Admit date: 04/05/2019 Discharge date: 04/07/2019  Admitted From: home Discharge disposition: home   Recommendations for Outpatient Follow-Up:   1. Follow up with stroke clinic as schedule in 4 weeks. 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   Discharge Diagnosis:   Principal Problem:   CVA (cerebral vascular accident) [facility]) Active Problems:   Endometrial cancer ([place])   Mixed stress and urge urinary incontinence   Acute CVA (cerebrovascular accident) [facility])    Discharge Condition: Improved.  Diet recommendation: Low sodium, heart healthy.    Wound care: None.  Code status: Full.   History of Present Illness:   Lisa [name] is a 74 y.o. female with medical history significant of Remote Uterine CA, presented 3/16 with sudden onset of lower extremity weakness.  Patient was pushing a shopping cart at [employer], suddenly felt weakness of lower extremities bilaterally, more on the left side and became numb stating " I did not feel my legs". Soon she started to feel lightheaded, and she felt like  about to pass out, and both arms started to feel weak as well.  But she denied any chest pain, or short of breath, no palpitations no nauseous or vomiting, no blurred vision no hearing changes.  She was able to control herself to slide down on the floor. She denied any loss of consciousness.  Bystander called EMS.  Her symptoms significant improved and then resolved in about 30 minutes.  In the ER she was asymptomatic and back to her baseline.  She denied any such episode happened ever.  She says she takes aspirin.   Hospital Course by Problem:   #1.CVA.  MRI with  corona radiata infarcts embolic secondary to unknown source.  Symptoms have resolved quickly.CT of the  head without acute abnormality.  CTA of the head and neck no LVO, mild right M1/MCA stenosis, small distal supraclinoid right ICA aneurysm, lower extremity Doppler reveals no DVT, LDL is 139, hemoglobin A1c is 5.7.  Echo with EF 65 to 70%.  Started on aspirin Plavix and a statin.  Evaluated by PT and OT with no further acute needs noted.  Evaluated by speech noted no OT needs from physical or cognitive standpoint therefore full evaluation with speech therapy not warranted.  Evaluated by neurology who recommend loop recorder as well as continuation of statin and combination of aspirin and Plavix for 3 weeks then aspirin alone.  Outpatient follow-up 4 weeks with the stroke clinic. Loop recorder provided on day of discahrge  #2.  Endometrial cancer.  Chart review indicates followed by obgyn.  .     Medical Consultants:   Erlinda Hong. neurology   Discharge Exam:   Vitals:   04/06/19 2328 04/07/19 0348  BP: 139/82 118/73  Pulse: 91 91  Resp:    Temp: 97.8 F (36.6 C) 97.6 F (36.4 C)  SpO2: 95% 96%   Vitals:   04/06/19 1746 04/06/19 1949 04/06/19 2328 04/07/19 0348  BP: 104/65 123/61 139/82 118/73  Pulse: 93 96 91 91  Resp: 18     Temp: 98.4 F (36.9 C) 98.5 F (36.9 C) 97.8 F (36.6 C) 97.6 F (36.4 C)  TempSrc: Oral Oral Oral Oral  SpO2: 98% 97% 95% 96%  Weight:        General exam: Appears calm  and comfortable.  Respiratory system: Clear to auscultation. Respiratory effort normal. Cardiovascular system: S1 & S2 heard, RRR. No JVD,  rubs, gallops or clicks. No murmurs. Gastrointestinal system: Abdomen is nondistended, soft and nontender. No organomegaly or masses felt. Normal bowel sounds heard. Central nervous system: Alert and oriented. No focal neurological deficits. Extremities: No clubbing,  or cyanosis. No edema. Skin: No rashes, lesions or ulcers. Psychiatry: Judgement and insight appear normal. Mood & affect appropriate.    The results of significant diagnostics from  this hospitalization (including imaging, microbiology, ancillary and laboratory) are listed below for reference.     Procedures and Diagnostic Studies:   CT Code Stroke CTA Head W/WO contrast  Result Date: 04/05/2019 CLINICAL DATA:  Left-sided weakness EXAM: CT ANGIOGRAPHY HEAD AND NECK TECHNIQUE: Multidetector CT imaging of the head and neck was performed using the standard protocol during bolus administration of intravenous contrast. Multiplanar CT image reconstructions and MIPs were obtained to evaluate the vascular anatomy. Carotid stenosis measurements (when applicable) are obtained utilizing NASCET criteria, using the distal internal carotid diameter as the denominator. CONTRAST:  55mL OMNIPAQUE IOHEXOL 350 MG/ML SOLN COMPARISON:  None. FINDINGS: CTA NECK FINDINGS Aortic arch: Great vessel origins are patent. Right carotid system: Patent. There is eccentric noncalcified plaque along the common carotid causing less than 50% stenosis. No measurable stenosis at the ICA origin. Left carotid system: Patent. Minimal calcified plaque at the ICA origin. No measurable stenosis. Vertebral arteries: Patent and tortuous. Right vertebral artery is dominant. Skeleton: Multilevel degenerative changes of the cervical spine. Other neck: No mass or adenopathy. Upper chest: No apical lung mass. Review of the MIP images confirms the above findings CTA HEAD FINDINGS Anterior circulation: Intracranial internal carotid arteries are patent with minor calcified plaque. Shallow broad-based protrusion along the distal supraclinoid left ICA directed inferiorly measuring approximately 2 x 1 mm. Anterior cerebral arteries are patent. 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 There is atherosclerotic irregularity and eccentric noncalcified plaque along the right M1 MCA causing mild stenosis. Posterior circulation: Intracranial vertebral arteries are patent. Left vertebral artery becomes diminutive after PICA origin. Basilar artery is  patent. Posterior cerebral arteries are patent. There is a large right posterior communicating artery. Venous sinuses: As permitted by contrast timing, patent. Review of the MIP images confirms the above findings IMPRESSION: No large vessel occlusion. No hemodynamically significant stenosis in the neck. 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 Initial results were communicated to Dr. Leonel Ramsay at 3:10 pmon 3/16/2021by text page via the [facility] messaging system. Electronically Signed   By: Macy Mis M.D.   On: 04/05/2019 15:20   CT Code Stroke CTA Neck W/WO contrast  Result Date: 04/05/2019 CLINICAL DATA:  Left-sided weakness EXAM: CT ANGIOGRAPHY HEAD AND NECK TECHNIQUE: Multidetector CT imaging of the head and neck was performed using the standard protocol during bolus administration of intravenous contrast. Multiplanar CT image reconstructions and MIPs were obtained to evaluate the vascular anatomy. Carotid stenosis measurements (when applicable) are obtained utilizing NASCET criteria, using the distal internal carotid diameter as the denominator. CONTRAST:  60mL OMNIPAQUE IOHEXOL 350 MG/ML SOLN COMPARISON:  None. FINDINGS: CTA NECK FINDINGS Aortic arch: Great vessel origins are patent. Right carotid system: Patent. There is eccentric noncalcified plaque along the common carotid causing less than 50% stenosis. No measurable stenosis at the ICA origin. Left carotid system: Patent. Minimal calcified plaque at the ICA origin. No measurable stenosis. Vertebral arteries: Patent and tortuous.  Right vertebral artery is dominant. Skeleton: Multilevel degenerative changes of the cervical spine. Other neck: No mass or adenopathy. Upper chest: No apical lung mass. Review of the MIP images confirms the above findings CTA HEAD FINDINGS Anterior circulation: Intracranial internal carotid arteries are patent with minor calcified plaque. Shallow  broad-based protrusion along the distal supraclinoid left ICA directed inferiorly measuring approximately 2 x 1 mm. Anterior cerebral arteries are patent. 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 There is atherosclerotic irregularity and eccentric noncalcified plaque along the right M1 MCA causing mild stenosis. Posterior circulation: Intracranial vertebral arteries are patent. Left vertebral artery becomes diminutive after PICA origin. Basilar artery is patent. Posterior cerebral arteries are patent. There is a large right posterior communicating artery. Venous sinuses: As permitted by contrast timing, patent. Review of the MIP images confirms the above findings IMPRESSION: No large vessel occlusion. No hemodynamically significant stenosis in the neck. 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 Initial results were communicated to Dr. Leonel Ramsay at 3:10 pmon 3/16/2021by text page via the [facility] messaging system. Electronically Signed   By: Macy Mis M.D.   On: 04/05/2019 15:20   MR BRAIN WO CONTRAST  Result Date: 04/05/2019 CLINICAL DATA:  Transient ischemic attack EXAM: MRI HEAD WITHOUT CONTRAST TECHNIQUE: Multiplanar, multiecho pulse sequences of the brain and surrounding structures were obtained without intravenous contrast. COMPARISON:  None. FINDINGS: BRAIN: There are punctate foci of diffusion restriction within the corona radiata bilaterally. Diffuse confluent hyperintense T2-weighted signal within the periventricular, deep and juxtacortical white matter, most commonly due to chronic ischemic microangiopathy. There are multiple old infarcts, including the anterior left MCA territory and multiple locations within the cerebellum. Advanced generalized atrophy. Midline structures are normal. Left middle cranial fossa arachnoid cyst. VASCULAR: Major flow voids are preserved. Susceptibility-sensitive sequences show no chronic microhemorrhage  or superficial siderosis. SKULL AND UPPER CERVICAL SPINE: Normal calvarium and skull base. Visualized upper cervical spine and soft tissues are normal. SINUSES/ORBITS: No fluid levels or advanced mucosal thickening. No mastoid or middle ear effusion. Normal orbits. IMPRESSION: 1. Multiple punctate foci of acute ischemia within the corona radiata bilaterally. No hemorrhage or mass effect. 2. Multiple old infarcts and findings of chronic ischemic microangiopathy. Electronically Signed   By: Ulyses Jarred M.D.   On: 04/05/2019 19:32   ECHOCARDIOGRAM COMPLETE  Result Date: 04/06/2019    ECHOCARDIOGRAM REPORT   Patient Name:   AALIAYAH CLUFF [facility] Date of Exam: 04/06/2019 Medical Rec #:  [id number]              Height:       64.0 in Accession #:    [id number]             Weight:       170.0 lb Date of Birth:  21-Nov-1945               BSA:          1.826 m Patient Age:    74 years               BP:           137/66 mmHg Patient Gender: F                      HR:           95 bpm. Exam Location:  Inpatient Procedure: 2D Echo Indications:    435.9 TIA  History:  Patient has no prior history of Echocardiogram examinations. No                 prior cardiac hx on file.  Sonographer:    Jannett Celestine RDCS (AE) Referring Phys: [id number] Lequita Halt  Sonographer Comments: off axis apical windows IMPRESSIONS  1. Left ventricular ejection fraction, by estimation, is 65 to 70%. The left ventricle has normal function. The left ventricle has no regional wall motion abnormalities. Left ventricular diastolic parameters are indeterminate.  2. Right ventricular systolic function is normal. The right ventricular size is normal. Tricuspid regurgitation signal is inadequate for assessing PA pressure.  3. The mitral valve is normal in structure. No evidence of mitral valve regurgitation.  4. The aortic valve was not well visualized. Aortic valve regurgitation is not visualized. No aortic stenosis is present.  5. The inferior vena cava  is normal in size with greater than 50% respiratory variability, suggesting right atrial pressure of 3 mmHg. FINDINGS  Left Ventricle: Left ventricular ejection fraction, by estimation, is 65 to 70%. The left ventricle has normal function. The left ventricle has no regional wall motion abnormalities. The left ventricular internal cavity size was normal in size. There is  no left ventricular hypertrophy. Left ventricular diastolic parameters are indeterminate. Right Ventricle: The right ventricular size is normal. No increase in right ventricular wall thickness. Right ventricular systolic function is normal. Tricuspid regurgitation signal is inadequate for assessing PA pressure. Left Atrium: Left atrial size was normal in size. Right Atrium: Right atrial size was normal in size. Pericardium: Trivial pericardial effusion is present. Presence of pericardial fat pad. Mitral Valve: The mitral valve is normal in structure. No evidence of mitral valve regurgitation. Tricuspid Valve: The tricuspid valve is grossly normal. Tricuspid valve regurgitation is not demonstrated. Aortic Valve: The aortic valve was not well visualized. Aortic valve regurgitation is not visualized. No aortic stenosis is present. Pulmonic Valve: The pulmonic valve was not well visualized. Pulmonic valve regurgitation is not visualized. Aorta: The aortic root is normal in size and structure. Venous: The inferior vena cava is normal in size with greater than 50% respiratory variability, suggesting right atrial pressure of 3 mmHg. IAS/Shunts: The interatrial septum was not well visualized.  LEFT VENTRICLE PLAX 2D LVIDd:         4.54 cm  Diastology LVIDs:         2.59 cm  LV e' lateral:   8.70 cm/s LV PW:         0.82 cm  LV E/e' lateral: 6.1 LV IVS:        0.71 cm  LV e' medial:    6.53 cm/s LVOT diam:     1.90 cm  LV E/e' medial:  8.1 LV SV:         38 LV SV Index:   21 LVOT Area:     2.84 cm  LEFT ATRIUM         Index LA diam:    3.10 cm 1.70 cm/m   AORTIC VALVE LVOT Vmax:   63.30 cm/s LVOT Vmean:  46.700 cm/s LVOT VTI:    0.133 m  AORTA Ao Root diam: 2.50 cm MITRAL VALVE MV Area (PHT): 2.17 cm    SHUNTS MV Decel Time: 349 msec    Systemic VTI:  0.13 m MV E velocity: 53.10 cm/s  Systemic Diam: 1.90 cm MV A velocity: 81.80 cm/s MV E/A ratio:  0.65 Oswaldo Milian MD Electronically signed by Oswaldo Milian MD Signature  Date/Time: 04/06/2019/2:29:36 PM    Final    CT HEAD CODE STROKE WO CONTRAST  Result Date: 04/05/2019 CLINICAL DATA:  Code stroke.  Left-sided weakness EXAM: CT HEAD WITHOUT CONTRAST TECHNIQUE: Contiguous axial images were obtained from the base of the skull through the vertex without intravenous contrast. COMPARISON:  None. FINDINGS: Brain: There is no acute intracranial hemorrhage, mass effect, or edema. Multiple chronic infarcts are identified including involvement of the left frontal lobe, right parietal lobe, corona radiata bilaterally, left occipital lobe, posteromedial right temporal lobe, and bilateral cerebellar hemispheres. Additional patchy and confluent hypoattenuation in the supratentorial white matter is nonspecific but probably reflects chronic microvascular ischemic changes. Prominence of the ventricles sulci reflects generalized parenchymal volume loss. Vascular: No hyperdense vessel. There is intracranial atherosclerotic calcification at the skull base. Skull: Unremarkable. Sinuses/Orbits: No acute finding. Other: Mastoid air cells are clear. Degenerative changes at the temporomandibular joints. ASPECTS [facility] Stroke Program Early CT Score) - Ganglionic level infarction (caudate, lentiform nuclei, internal capsule, insula, M1-M3 cortex): 7 - Supraganglionic infarction (M4-M6 cortex): 3 Total score (0-10 with 10 being normal): 10 IMPRESSION: 1. No acute intracranial hemorrhage or evidence of acute infarction. 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 2. ASPECTS is 10 accounting for  chronic infarcts. These results were communicated to Dr. Cheral Marker at 3:04 pmon 3/16/2021by text page via the [facility] messaging system. Electronically Signed   By: Macy Mis M.D.   On: 04/05/2019 15:08   VAS [place] LOWER EXTREMITY VENOUS (DVT)  Result Date: 04/06/2019  Lower Venous DVTStudy Indications: Stroke.  Comparison Study: No prior study on file Performing Technologist: Sharion Dove RVS  Examination Guidelines: A complete evaluation includes B-mode imaging, spectral Doppler, color Doppler, and power Doppler as needed of all accessible portions of each vessel. Bilateral testing is considered an integral part of a complete examination. Limited examinations for reoccurring indications may be performed as noted. The reflux portion of the exam is performed with the patient in reverse Trendelenburg.  +---------+---------------+---------+-----------+----------+--------------+ RIGHT    CompressibilityPhasicitySpontaneityPropertiesThrombus Aging +---------+---------------+---------+-----------+----------+--------------+ CFV      Full           Yes      Yes                                 +---------+---------------+---------+-----------+----------+--------------+ SFJ      Full                                                        +---------+---------------+---------+-----------+----------+--------------+ FV Prox  Full                                                        +---------+---------------+---------+-----------+----------+--------------+ FV Mid   Full                                                        +---------+---------------+---------+-----------+----------+--------------+ FV DistalFull                                                        +---------+---------------+---------+-----------+----------+--------------+  PFV      Full                                                         +---------+---------------+---------+-----------+----------+--------------+ POP      Full           Yes      Yes                                 +---------+---------------+---------+-----------+----------+--------------+ PTV      Full                                                        +---------+---------------+---------+-----------+----------+--------------+ PERO     Full                                                        +---------+---------------+---------+-----------+----------+--------------+   +---------+---------------+---------+-----------+----------+--------------+ LEFT     CompressibilityPhasicitySpontaneityPropertiesThrombus Aging +---------+---------------+---------+-----------+----------+--------------+ CFV      Full           Yes      Yes                                 +---------+---------------+---------+-----------+----------+--------------+ SFJ      Full                                                        +---------+---------------+---------+-----------+----------+--------------+ FV Prox  Full                                                        +---------+---------------+---------+-----------+----------+--------------+ FV Mid   Full                                                        +---------+---------------+---------+-----------+----------+--------------+ FV DistalFull                                                        +---------+---------------+---------+-----------+----------+--------------+ PFV      Full                                                        +---------+---------------+---------+-----------+----------+--------------+  POP      Full           Yes      Yes                                 +---------+---------------+---------+-----------+----------+--------------+ PTV      Full                                                         +---------+---------------+---------+-----------+----------+--------------+ PERO     Full                                                        +---------+---------------+---------+-----------+----------+--------------+     Summary: BILATERAL: - No evidence of deep vein thrombosis seen in the lower extremities, bilaterally.   *See table(s) above for measurements and observations. Electronically signed by Curt Jews MD on 04/06/2019 at 3:53:50 PM.    Final      Labs:   Basic Metabolic Panel: Recent Labs  Lab 04/05/19 1453 04/05/19 1453 04/05/19 1457 04/07/19 0532  NA 139  --  139 140  K 4.5   < > 4.3 3.8  CL 104  --  104 105  CO2 25  --   --  26  GLUCOSE 122*  --  111* 111*  BUN 19  --  22 25*  CREATININE 1.00  --  1.10* 0.93  CALCIUM 9.0  --   --  8.5*   < > = values in this interval not displayed.   GFR Estimated Creatinine Clearance: 51.8 mL/min (by C-G formula based on SCr of 0.93 mg/dL). Liver Function Tests: Recent Labs  Lab 04/05/19 1453  AST 18  ALT 17  ALKPHOS 72  BILITOT 0.7  PROT 6.5  ALBUMIN 3.6   No results for input(s): LIPASE, AMYLASE in the last 168 hours. No results for input(s): AMMONIA in the last 168 hours. Coagulation profile Recent Labs  Lab 04/05/19 1453  INR 1.0    CBC: Recent Labs  Lab 04/05/19 1453 04/05/19 1457  WBC 7.5  --   NEUTROABS 4.7  --   HGB 14.1 13.9  HCT 43.2 41.0  MCV 100.0  --   PLT 290  --    Cardiac Enzymes: No results for input(s): CKTOTAL, CKMB, CKMBINDEX, TROPONINI in the last 168 hours. BNP: Invalid input(s): POCBNP CBG: Recent Labs  Lab 04/05/19 1519  GLUCAP 92   D-Dimer No results for input(s): DDIMER in the last 72 hours. Hgb A1c Recent Labs    04/06/19 0250  HGBA1C 5.7*   Lipid Profile Recent Labs    04/06/19 0250  CHOL 211*  HDL 51  LDLCALC 139*  TRIG 107  CHOLHDL 4.1   Thyroid function studies Recent Labs    04/05/19 1833  TSH 0.923   Anemia work up No results for  input(s): VITAMINB12, FOLATE, FERRITIN, TIBC, IRON, RETICCTPCT in the last 72 hours. Microbiology Recent Results (from the past 240 hour(s))  SARS CORONAVIRUS 2 (TAT 6-24 HRS) Nasopharyngeal Nasopharyngeal Swab     Status: None   Collection Time: 04/05/19  4:14  PM   Specimen: Nasopharyngeal Swab  Result Value Ref Range Status   SARS Coronavirus 2 NEGATIVE NEGATIVE Final    Comment: (NOTE) SARS-CoV-2 target nucleic acids are NOT DETECTED. The SARS-CoV-2 RNA is generally detectable in upper and lower respiratory specimens during the acute phase of infection. Negative results do not preclude SARS-CoV-2 infection, do not rule out co-infections with other pathogens, and should not be used as the sole basis for treatment or other patient management decisions. Negative results must be combined with clinical observations, patient history, and epidemiological information. The expected result is Negative. Fact Sheet for Patients: SugarRoll.be Fact Sheet for Healthcare Providers: https://www.woods-mathews.com/ This test is not yet approved or cleared by the [place] FDA and  has been authorized for detection and/or diagnosis of SARS-CoV-2 by FDA under an Emergency Use Authorization (EUA). This EUA will remain  in effect (meaning this test can be used) for the duration of the COVID-19 declaration under Section 56 4(b)(1) of the Act, 21 U.S.C. section 360bbb-3(b)(1), unless the authorization is terminated or revoked sooner. Performed at [facility], [place] [address], [place], [place] [address]      Discharge Instructions:   Discharge Instructions    Ambulatory referral to Neurology   Complete by: As directed    Follow up with stroke clinic NP (Jessica Vanschaick or Cecille Rubin, if both not available, consider Zachery Dauer, or Ahern) at [facility] in about 4 weeks. Thanks.     Allergies as of 04/07/2019      Reactions   Codeine Nausea And  Vomiting   Tylenol [acetaminophen] Hives      Medication List    TAKE these medications    stroke: mapping our early stages of recovery book Misc 1 each by Does not apply route once for 1 dose.   aspirin 81 MG EC tablet Take 1 tablet (81 mg total) by mouth daily. Start taking on: April 08, 2019   atorvastatin 40 MG tablet Commonly known as: LIPITOR Take 1 tablet (40 mg total) by mouth daily at 6 PM.   betamethasone dipropionate 0.05 % ointment Commonly known as: DIPROLENE Apply 1 application topically daily.   CENTRUM SILVER 50+WOMEN PO Take 1 tablet by mouth daily.   clopidogrel 75 MG tablet Commonly known as: PLAVIX Take 1 tablet (75 mg total) by mouth daily for 19 days. Start taking on: April 08, 2019   fexofenadine 180 MG tablet Commonly known as: ALLEGRA Take 180 mg by mouth daily.   Magnesium Oxide 400 MG Caps Take 1 capsule by mouth daily.   SUPER B COMPLEX/C PO Take 1 tablet by mouth daily.   VITAMIN D3 PO Take 25 mg by mouth daily.      Follow-up Information    [facility]. Schedule an appointment as soon as possible for a visit in 4 week(s).   Specialty: Neurology Contact information: [address] [place] [phone]           Time coordinating discharge: 45 minutes  Signed:  Radene Gunning NP  [facility] 04/07/2019, 9:46 AM

## 2019-04-07 NOTE — Progress Notes (Signed)
  Speech Language Pathology  Patient Details Name: Lisa [name] MRN: [id number] DOB: [birth year] Today's Date: 04/07/2019 Time:  -     Received order for speech-language-cognitive assessment. This therapist discussed pt's function and assessment with OT who reported pt is functional, independent and has no OT needs from physical or cognitive standpoint. Full evaluation with ST is not warranted at this time.             Houston Siren 04/07/2019, 8:22 AM  Orbie Pyo Colvin Caroli.Ed Risk analyst [phone] Office [phone]

## 2019-04-07 NOTE — Discharge Summary (Signed)
Physician Discharge Summary  Lisa [name] [id number] DOB: 05-Apr-1945 DOA: 04/05/2019  PCP: Cari Caraway, MD  Admit date: 04/05/2019 Discharge date: 04/07/2019  Admitted From: home Discharge disposition: home   Recommendations for Outpatient Follow-Up:   1. Follow-up with neurology clinic in 4 weeks as instructed.   2. Follow-up with cardiology for pacer check March 25 as scheduled   Discharge Diagnosis:   Principal Problem:   CVA (cerebral vascular accident) [facility]) Active Problems:   Endometrial cancer ([place])   Mixed stress and urge urinary incontinence   Acute CVA (cerebrovascular accident) [facility])    Discharge Condition: Improved.  Diet recommendation: Low sodium, heart healthy.    Wound care: None.  Code status: Full.   History of Present Illness:   Lisa [name] is a very pleasant 74 y.o. female with medical history significant of Remote Uterine CA, presented 04/06/19 with sudden onset of lower extremity weakness more on the left side.  Patient was pushing a shopping cart at [employer], suddenly felt weakness of lower extremities bilaterally, more on the left side and becomes numb as well almost " I did not feel my legs". Soon she started to feel lightheaded, and she felt like almost about to pass out, and her both arms started to feel weak as well.  But she denied any chest pain, or short of breath, no palpitations no nauseous or vomiting, no blurred vision no hearing changes.  She was able to control herself to slide down on the floor, but she denied any loss of consciousness.  Bystander called EMS.  Her symptoms significant improved and then resolved in about 30 minutes.  In the ER she was asymptomatic and back to her baseline.  She denied any such episode happened ever.  She says she takes aspirin.   Hospital Course by Problem:   #1. Stroke. 3-4 punctate bilateral MCA/ACA watershed infarcts embolic secondary to unknown source. CT of the head  without acute abnormality.  CTA of the head and neck no LVO, mild right M1/MCA stenosis, small distal supraclinoid right ICA aneurysm, lower extremity Doppler reveals no DVT, LDL is 139, hemoglobin A1c is 5.7.  Echo with EF of 65 to 70%. No history htn.  She was provided with aspirin and Plavix and statin.  Evaluated by PT OT in speech with no recommendations for further therapies.  Evaluated by neurology who opined loop recorder for outpatient monitoring which was inserted on day of discharge.  She will continue aspirin and Plavix for 3 weeks and then aspirin alone.  Follow-up with cardiology March 25.  Follow-up with neurology 4 weeks.  #2.  Endometrial cancer.  Chart review indicates followed by obgyn.  .    Medical Consultants:   [facility]     Discharge Exam:   Vitals:   04/07/19 0348 04/07/19 1241  BP: 118/73 116/62  Pulse: 91 88  Resp:  18  Temp: 97.6 F (36.4 C) 98.7 F (37.1 C)  SpO2: 96% 96%   Vitals:   04/06/19 1949 04/06/19 2328 04/07/19 0348 04/07/19 1241  BP: 123/61 139/82 118/73 116/62  Pulse: 96 91 91 88  Resp:    18  Temp: 98.5 F (36.9 C) 97.8 F (36.6 C) 97.6 F (36.4 C) 98.7 F (37.1 C)  TempSrc: Oral Oral Oral Oral  SpO2: 97% 95% 96% 96%  Weight:        General exam: Appears calm and comfortable.  Respiratory system: Clear to auscultation. Respiratory effort normal. Cardiovascular system:  S1 & S2 heard, RRR. No JVD,  rubs, gallops or clicks. No murmurs. Gastrointestinal system: Abdomen is nondistended, soft and nontender. No organomegaly or masses felt. Normal bowel sounds heard. Central nervous system: Alert and oriented. No focal neurological deficits. Extremities: No clubbing,  or cyanosis. No edema. Skin: No rashes, lesions or ulcers. Psychiatry: Judgement and insight appear normal. Mood & affect appropriate.    The results of significant diagnostics from this hospitalization (including imaging, microbiology, ancillary and laboratory) are  listed below for reference.     Procedures and Diagnostic Studies:   CT Code Stroke CTA Head W/WO contrast  Result Date: 04/05/2019 CLINICAL DATA:  Left-sided weakness EXAM: CT ANGIOGRAPHY HEAD AND NECK TECHNIQUE: Multidetector CT imaging of the head and neck was performed using the standard protocol during bolus administration of intravenous contrast. Multiplanar CT image reconstructions and MIPs were obtained to evaluate the vascular anatomy. Carotid stenosis measurements (when applicable) are obtained utilizing NASCET criteria, using the distal internal carotid diameter as the denominator. CONTRAST:  31mL OMNIPAQUE IOHEXOL 350 MG/ML SOLN COMPARISON:  None. FINDINGS: CTA NECK FINDINGS Aortic arch: Great vessel origins are patent. Right carotid system: Patent. There is eccentric noncalcified plaque along the common carotid causing less than 50% stenosis. No measurable stenosis at the ICA origin. Left carotid system: Patent. Minimal calcified plaque at the ICA origin. No measurable stenosis. Vertebral arteries: Patent and tortuous. Right vertebral artery is dominant. Skeleton: Multilevel degenerative changes of the cervical spine. Other neck: No mass or adenopathy. Upper chest: No apical lung mass. Review of the MIP images confirms the above findings CTA HEAD FINDINGS Anterior circulation: Intracranial internal carotid arteries are patent with minor calcified plaque. Shallow broad-based protrusion along the distal supraclinoid left ICA directed inferiorly measuring approximately 2 x 1 mm. Anterior cerebral arteries are patent. 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 There is atherosclerotic irregularity and eccentric noncalcified plaque along the right M1 MCA causing mild stenosis. Posterior circulation: Intracranial vertebral arteries are patent. Left vertebral artery becomes diminutive after PICA origin. Basilar artery is patent. Posterior cerebral arteries are patent. There is a large right posterior  communicating artery. Venous sinuses: As permitted by contrast timing, patent. Review of the MIP images confirms the above findings IMPRESSION: No large vessel occlusion. No hemodynamically significant stenosis in the neck. 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 Initial results were communicated to Dr. Leonel Ramsay at 3:10 pmon 3/16/2021by text page via the [facility] messaging system. Electronically Signed   By: Macy Mis M.D.   On: 04/05/2019 15:20   CT Code Stroke CTA Neck W/WO contrast  Result Date: 04/05/2019 CLINICAL DATA:  Left-sided weakness EXAM: CT ANGIOGRAPHY HEAD AND NECK TECHNIQUE: Multidetector CT imaging of the head and neck was performed using the standard protocol during bolus administration of intravenous contrast. Multiplanar CT image reconstructions and MIPs were obtained to evaluate the vascular anatomy. Carotid stenosis measurements (when applicable) are obtained utilizing NASCET criteria, using the distal internal carotid diameter as the denominator. CONTRAST:  79mL OMNIPAQUE IOHEXOL 350 MG/ML SOLN COMPARISON:  None. FINDINGS: CTA NECK FINDINGS Aortic arch: Great vessel origins are patent. Right carotid system: Patent. There is eccentric noncalcified plaque along the common carotid causing less than 50% stenosis. No measurable stenosis at the ICA origin. Left carotid system: Patent. Minimal calcified plaque at the ICA origin. No measurable stenosis. Vertebral arteries: Patent and tortuous. Right vertebral artery is dominant. Skeleton: Multilevel degenerative changes of the cervical spine.  Other neck: No mass or adenopathy. Upper chest: No apical lung mass. Review of the MIP images confirms the above findings CTA HEAD FINDINGS Anterior circulation: Intracranial internal carotid arteries are patent with minor calcified plaque. Shallow broad-based protrusion along the distal supraclinoid left ICA directed inferiorly  measuring approximately 2 x 1 mm. Anterior cerebral arteries are patent. 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 There is atherosclerotic irregularity and eccentric noncalcified plaque along the right M1 MCA causing mild stenosis. Posterior circulation: Intracranial vertebral arteries are patent. Left vertebral artery becomes diminutive after PICA origin. Basilar artery is patent. Posterior cerebral arteries are patent. There is a large right posterior communicating artery. Venous sinuses: As permitted by contrast timing, patent. Review of the MIP images confirms the above findings IMPRESSION: No large vessel occlusion. No hemodynamically significant stenosis in the neck. 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 Initial results were communicated to Dr. Leonel Ramsay at 3:10 pmon 3/16/2021by text page via the [facility] messaging system. Electronically Signed   By: Macy Mis M.D.   On: 04/05/2019 15:20   MR BRAIN WO CONTRAST  Result Date: 04/05/2019 CLINICAL DATA:  Transient ischemic attack EXAM: MRI HEAD WITHOUT CONTRAST TECHNIQUE: Multiplanar, multiecho pulse sequences of the brain and surrounding structures were obtained without intravenous contrast. COMPARISON:  None. FINDINGS: BRAIN: There are punctate foci of diffusion restriction within the corona radiata bilaterally. Diffuse confluent hyperintense T2-weighted signal within the periventricular, deep and juxtacortical white matter, most commonly due to chronic ischemic microangiopathy. There are multiple old infarcts, including the anterior left MCA territory and multiple locations within the cerebellum. Advanced generalized atrophy. Midline structures are normal. Left middle cranial fossa arachnoid cyst. VASCULAR: Major flow voids are preserved. Susceptibility-sensitive sequences show no chronic microhemorrhage or superficial siderosis. SKULL AND UPPER CERVICAL SPINE: Normal calvarium and  skull base. Visualized upper cervical spine and soft tissues are normal. SINUSES/ORBITS: No fluid levels or advanced mucosal thickening. No mastoid or middle ear effusion. Normal orbits. IMPRESSION: 1. Multiple punctate foci of acute ischemia within the corona radiata bilaterally. No hemorrhage or mass effect. 2. Multiple old infarcts and findings of chronic ischemic microangiopathy. Electronically Signed   By: Ulyses Jarred M.D.   On: 04/05/2019 19:32   ECHOCARDIOGRAM COMPLETE  Result Date: 04/06/2019    ECHOCARDIOGRAM REPORT   Patient Name:   LATOYAH MCPETERS [facility] Date of Exam: 04/06/2019 Medical Rec #:  [id number]              Height:       64.0 in Accession #:    [id number]             Weight:       170.0 lb Date of Birth:  05-07-1945               BSA:          1.826 m Patient Age:    68 years               BP:           137/66 mmHg Patient Gender: F                      HR:           95 bpm. Exam Location:  Inpatient Procedure: 2D Echo Indications:    435.9 TIA  History:        Patient has no prior history of Echocardiogram  examinations. No                 prior cardiac hx on file.  Sonographer:    Jannett Celestine RDCS (AE) Referring Phys: [id number] Lequita Halt  Sonographer Comments: off axis apical windows IMPRESSIONS  1. Left ventricular ejection fraction, by estimation, is 65 to 70%. The left ventricle has normal function. The left ventricle has no regional wall motion abnormalities. Left ventricular diastolic parameters are indeterminate.  2. Right ventricular systolic function is normal. The right ventricular size is normal. Tricuspid regurgitation signal is inadequate for assessing PA pressure.  3. The mitral valve is normal in structure. No evidence of mitral valve regurgitation.  4. The aortic valve was not well visualized. Aortic valve regurgitation is not visualized. No aortic stenosis is present.  5. The inferior vena cava is normal in size with greater than 50% respiratory variability, suggesting  right atrial pressure of 3 mmHg. FINDINGS  Left Ventricle: Left ventricular ejection fraction, by estimation, is 65 to 70%. The left ventricle has normal function. The left ventricle has no regional wall motion abnormalities. The left ventricular internal cavity size was normal in size. There is  no left ventricular hypertrophy. Left ventricular diastolic parameters are indeterminate. Right Ventricle: The right ventricular size is normal. No increase in right ventricular wall thickness. Right ventricular systolic function is normal. Tricuspid regurgitation signal is inadequate for assessing PA pressure. Left Atrium: Left atrial size was normal in size. Right Atrium: Right atrial size was normal in size. Pericardium: Trivial pericardial effusion is present. Presence of pericardial fat pad. Mitral Valve: The mitral valve is normal in structure. No evidence of mitral valve regurgitation. Tricuspid Valve: The tricuspid valve is grossly normal. Tricuspid valve regurgitation is not demonstrated. Aortic Valve: The aortic valve was not well visualized. Aortic valve regurgitation is not visualized. No aortic stenosis is present. Pulmonic Valve: The pulmonic valve was not well visualized. Pulmonic valve regurgitation is not visualized. Aorta: The aortic root is normal in size and structure. Venous: The inferior vena cava is normal in size with greater than 50% respiratory variability, suggesting right atrial pressure of 3 mmHg. IAS/Shunts: The interatrial septum was not well visualized.  LEFT VENTRICLE PLAX 2D LVIDd:         4.54 cm  Diastology LVIDs:         2.59 cm  LV e' lateral:   8.70 cm/s LV PW:         0.82 cm  LV E/e' lateral: 6.1 LV IVS:        0.71 cm  LV e' medial:    6.53 cm/s LVOT diam:     1.90 cm  LV E/e' medial:  8.1 LV SV:         38 LV SV Index:   21 LVOT Area:     2.84 cm  LEFT ATRIUM         Index LA diam:    3.10 cm 1.70 cm/m  AORTIC VALVE LVOT Vmax:   63.30 cm/s LVOT Vmean:  46.700 cm/s LVOT VTI:     0.133 m  AORTA Ao Root diam: 2.50 cm MITRAL VALVE MV Area (PHT): 2.17 cm    SHUNTS MV Decel Time: 349 msec    Systemic VTI:  0.13 m MV E velocity: 53.10 cm/s  Systemic Diam: 1.90 cm MV A velocity: 81.80 cm/s MV E/A ratio:  0.65 Oswaldo Milian MD Electronically signed by Oswaldo Milian MD Signature Date/Time: 04/06/2019/2:29:36 PM    Final  CT HEAD CODE STROKE WO CONTRAST  Result Date: 04/05/2019 CLINICAL DATA:  Code stroke.  Left-sided weakness EXAM: CT HEAD WITHOUT CONTRAST TECHNIQUE: Contiguous axial images were obtained from the base of the skull through the vertex without intravenous contrast. COMPARISON:  None. FINDINGS: Brain: There is no acute intracranial hemorrhage, mass effect, or edema. Multiple chronic infarcts are identified including involvement of the left frontal lobe, right parietal lobe, corona radiata bilaterally, left occipital lobe, posteromedial right temporal lobe, and bilateral cerebellar hemispheres. Additional patchy and confluent hypoattenuation in the supratentorial white matter is nonspecific but probably reflects chronic microvascular ischemic changes. Prominence of the ventricles sulci reflects generalized parenchymal volume loss. Vascular: No hyperdense vessel. There is intracranial atherosclerotic calcification at the skull base. Skull: Unremarkable. Sinuses/Orbits: No acute finding. Other: Mastoid air cells are clear. Degenerative changes at the temporomandibular joints. ASPECTS [facility]roke Program Early CT Score) - Ganglionic level infarction (caudate, lentiform nuclei, internal capsule, insula, M1-M3 cortex): 7 - Supraganglionic infarction (M4-M6 cortex): 3 Total score (0-10 with 10 being normal): 10 IMPRESSION: 1. No acute intracranial hemorrhage or evidence of acute infarction. 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 2. ASPECTS is 10 accounting for chronic infarcts. These results were communicated to Dr. Cheral Marker at 3:04 pmon  3/16/2021by text page via the [facility] messaging system. Electronically Signed   By: Macy Mis M.D.   On: 04/05/2019 15:08   VAS [place] LOWER EXTREMITY VENOUS (DVT)  Result Date: 04/06/2019  Lower Venous DVTStudy Indications: Stroke.  Comparison Study: No prior study on file Performing Technologist: Sharion Dove RVS  Examination Guidelines: A complete evaluation includes B-mode imaging, spectral Doppler, color Doppler, and power Doppler as needed of all accessible portions of each vessel. Bilateral testing is considered an integral part of a complete examination. Limited examinations for reoccurring indications may be performed as noted. The reflux portion of the exam is performed with the patient in reverse Trendelenburg.  +---------+---------------+---------+-----------+----------+--------------+ RIGHT    CompressibilityPhasicitySpontaneityPropertiesThrombus Aging +---------+---------------+---------+-----------+----------+--------------+ CFV      Full           Yes      Yes                                 +---------+---------------+---------+-----------+----------+--------------+ SFJ      Full                                                        +---------+---------------+---------+-----------+----------+--------------+ FV Prox  Full                                                        +---------+---------------+---------+-----------+----------+--------------+ FV Mid   Full                                                        +---------+---------------+---------+-----------+----------+--------------+ FV DistalFull                                                        +---------+---------------+---------+-----------+----------+--------------+  PFV      Full                                                        +---------+---------------+---------+-----------+----------+--------------+ POP      Full           Yes      Yes                                  +---------+---------------+---------+-----------+----------+--------------+ PTV      Full                                                        +---------+---------------+---------+-----------+----------+--------------+ PERO     Full                                                        +---------+---------------+---------+-----------+----------+--------------+   +---------+---------------+---------+-----------+----------+--------------+ LEFT     CompressibilityPhasicitySpontaneityPropertiesThrombus Aging +---------+---------------+---------+-----------+----------+--------------+ CFV      Full           Yes      Yes                                 +---------+---------------+---------+-----------+----------+--------------+ SFJ      Full                                                        +---------+---------------+---------+-----------+----------+--------------+ FV Prox  Full                                                        +---------+---------------+---------+-----------+----------+--------------+ FV Mid   Full                                                        +---------+---------------+---------+-----------+----------+--------------+ FV DistalFull                                                        +---------+---------------+---------+-----------+----------+--------------+ PFV      Full                                                        +---------+---------------+---------+-----------+----------+--------------+  POP      Full           Yes      Yes                                 +---------+---------------+---------+-----------+----------+--------------+ PTV      Full                                                        +---------+---------------+---------+-----------+----------+--------------+ PERO     Full                                                         +---------+---------------+---------+-----------+----------+--------------+     Summary: BILATERAL: - No evidence of deep vein thrombosis seen in the lower extremities, bilaterally.   *See table(s) above for measurements and observations. Electronically signed by Curt Jews MD on 04/06/2019 at 3:53:50 PM.    Final      Labs:   Basic Metabolic Panel: Recent Labs  Lab 04/05/19 1453 04/05/19 1453 04/05/19 1457 04/07/19 0532  NA 139  --  139 140  K 4.5   < > 4.3 3.8  CL 104  --  104 105  CO2 25  --   --  26  GLUCOSE 122*  --  111* 111*  BUN 19  --  22 25*  CREATININE 1.00  --  1.10* 0.93  CALCIUM 9.0  --   --  8.5*   < > = values in this interval not displayed.   GFR Estimated Creatinine Clearance: 51.8 mL/min (by C-G formula based on SCr of 0.93 mg/dL). Liver Function Tests: Recent Labs  Lab 04/05/19 1453  AST 18  ALT 17  ALKPHOS 72  BILITOT 0.7  PROT 6.5  ALBUMIN 3.6   No results for input(s): LIPASE, AMYLASE in the last 168 hours. No results for input(s): AMMONIA in the last 168 hours. Coagulation profile Recent Labs  Lab 04/05/19 1453  INR 1.0    CBC: Recent Labs  Lab 04/05/19 1453 04/05/19 1457  WBC 7.5  --   NEUTROABS 4.7  --   HGB 14.1 13.9  HCT 43.2 41.0  MCV 100.0  --   PLT 290  --    Cardiac Enzymes: No results for input(s): CKTOTAL, CKMB, CKMBINDEX, TROPONINI in the last 168 hours. BNP: Invalid input(s): POCBNP CBG: Recent Labs  Lab 04/05/19 1519  GLUCAP 92   D-Dimer No results for input(s): DDIMER in the last 72 hours. Hgb A1c Recent Labs    04/06/19 0250  HGBA1C 5.7*   Lipid Profile Recent Labs    04/06/19 0250  CHOL 211*  HDL 51  LDLCALC 139*  TRIG 107  CHOLHDL 4.1   Thyroid function studies Recent Labs    04/05/19 1833  TSH 0.923   Anemia work up No results for input(s): VITAMINB12, FOLATE, FERRITIN, TIBC, IRON, RETICCTPCT in the last 72 hours. Microbiology Recent Results (from the past 240 hour(s))  SARS  CORONAVIRUS 2 (TAT 6-24 HRS) Nasopharyngeal Nasopharyngeal Swab     Status: None   Collection Time: 04/05/19  4:14  PM   Specimen: Nasopharyngeal Swab  Result Value Ref Range Status   SARS Coronavirus 2 NEGATIVE NEGATIVE Final    Comment: (NOTE) SARS-CoV-2 target nucleic acids are NOT DETECTED. The SARS-CoV-2 RNA is generally detectable in upper and lower respiratory specimens during the acute phase of infection. Negative results do not preclude SARS-CoV-2 infection, do not rule out co-infections with other pathogens, and should not be used as the sole basis for treatment or other patient management decisions. Negative results must be combined with clinical observations, patient history, and epidemiological information. The expected result is Negative. Fact Sheet for Patients: SugarRoll.be Fact Sheet for Healthcare Providers: https://www.woods-mathews.com/ This test is not yet approved or cleared by the [place] FDA and  has been authorized for detection and/or diagnosis of SARS-CoV-2 by FDA under an Emergency Use Authorization (EUA). This EUA will remain  in effect (meaning this test can be used) for the duration of the COVID-19 declaration under Section 56 4(b)(1) of the Act, 21 U.S.C. section 360bbb-3(b)(1), unless the authorization is terminated or revoked sooner. Performed at [facility], [place] [address], [place], [place] [address]      Discharge Instructions:   Discharge Instructions    Ambulatory referral to Neurology   Complete by: As directed    Follow up with stroke clinic NP (Jessica Vanschaick or Cecille Rubin, if both not available, consider Zachery Dauer, or Ahern) at [facility] in about 4 weeks. Thanks.   Call MD for:  difficulty breathing, headache or visual disturbances   Complete by: As directed    Call MD for:  persistant dizziness or light-headedness   Complete by: As directed    Diet - low sodium heart  healthy   Complete by: As directed    Discharge instructions   Complete by: As directed    Follow up with neurology as instructed Take medications as prescribed   Increase activity slowly   Complete by: As directed      Allergies as of 04/07/2019      Reactions   Codeine Nausea And Vomiting   Tylenol [acetaminophen] Hives      Medication List    TAKE these medications    stroke: mapping our early stages of recovery book Misc 1 each by Does not apply route once for 1 dose.   aspirin 81 MG EC tablet Take 1 tablet (81 mg total) by mouth daily. Start taking on: April 08, 2019   atorvastatin 40 MG tablet Commonly known as: LIPITOR Take 1 tablet (40 mg total) by mouth daily at 6 PM.   betamethasone dipropionate 0.05 % ointment Commonly known as: DIPROLENE Apply 1 application topically daily.   CENTRUM SILVER 50+WOMEN PO Take 1 tablet by mouth daily.   clopidogrel 75 MG tablet Commonly known as: PLAVIX Take 1 tablet (75 mg total) by mouth daily for 19 days. Start taking on: April 08, 2019   fexofenadine 180 MG tablet Commonly known as: ALLEGRA Take 180 mg by mouth daily.   Magnesium Oxide 400 MG Caps Take 1 capsule by mouth daily.   SUPER B COMPLEX/C PO Take 1 tablet by mouth daily.   VITAMIN D3 PO Take 25 mg by mouth daily.      Follow-up Information    [facility]. Schedule an appointment as soon as possible for a visit in 4 week(s).   Specialty: Neurology Contact information: [address] [place] [phone]       [place], [place]  Follow up.   Specialty: Cardiology Why: 04/14/2019 @ 11:45AM, wound check visit (heart monitor) Contact information: [place] [address] [phone]            Time coordinating discharge: 40 minutes  Signed:  Radene Gunning NP  [facility] 04/07/2019, 3:28 PM

## 2019-04-07 NOTE — Discharge Instructions (Signed)
Wound care instructions Keep incision clean and dry for 3 days. You can remove outer dressing tomorrow. Leave steri-strips (little pieces of tape) on until seen in the office for wound check appointment. Call the office ([phone]) for redness, drainage, swelling, or fever.  

## 2019-04-12 ENCOUNTER — Other Ambulatory Visit: Payer: Self-pay | Admitting: Family Medicine

## 2019-04-12 DIAGNOSIS — R928 Other abnormal and inconclusive findings on diagnostic imaging of breast: Secondary | ICD-10-CM

## 2019-04-13 NOTE — Progress Notes (Signed)
Cardiology Office Note Date:  04/14/2019  Patient ID:  Lisa [name], Lisa [name] Dec 10, 1945, MRN [id number] PCP:  Cari Caraway, MD  Electrophysiologist  Dr. Lovena Le      Chief Complaint:  wound check  History of Present Illness: Lisa [name] is a 74 y.o. female with history of uterin Ca, stroke  She was seen by Dr. Lovena Le and myself during her hospital stay 3/18.21 for loop eavl 2/2 cryptogenic stroke, 3-4 punctate bilateral MCA/ACA watershedinfarctsembolic secondary to unknownsource .  TEE was deferred by neurology, and she underwent implant same day, discharged same day.  She is doing well, tape was irritating to her skin, otherwise no concerns at her loop site.  No CP, palpitations or cardiac awareness, no dizzy spells, near syncope or syncope. 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   Past Medical History:  Diagnosis Date  . Stroke ([place]) 04/06/2019  . Uterine cancer ([place]) 01/25/2016    Past Surgical History:  Procedure Laterality Date  . BREAST EXCISIONAL BIOPSY Left 1975  . LOOP RECORDER INSERTION N/A 04/07/2019   Procedure: LOOP RECORDER INSERTION;  Surgeon: Evans Lance, MD;  Location: [facility];  Service: Cardiovascular;  Laterality: N/A;  . TUBAL LIGATION  1970    Current Outpatient Medications  Medication Sig Dispense Refill  . aspirin EC 81 MG EC tablet Take 1 tablet (81 mg total) by mouth daily.    Marland Kitchen atorvastatin (LIPITOR) 40 MG tablet Take 1 tablet (40 mg total) by mouth daily at 6 PM. 30 tablet 0  . betamethasone dipropionate (DIPROLENE) 0.05 % ointment Apply 1 application topically daily.    . Cholecalciferol (VITAMIN D3 PO) Take 25 mg by mouth daily.    . clopidogrel (PLAVIX) 75 MG tablet Take 1 tablet (75 mg total) by mouth daily for 19 days. 19 tablet 0  . fexofenadine (ALLEGRA) 180 MG tablet Take 180 mg by mouth daily.    .  Magnesium Oxide 400 MG CAPS Take 1 capsule by mouth daily.    . Multiple Vitamins-Minerals (CENTRUM SILVER 50+WOMEN PO) Take 1 tablet by mouth daily.    . SUPER B COMPLEX/C PO Take 1 tablet by mouth daily.     No current facility-administered medications for this visit.    Allergies:   Codeine and Tylenol [acetaminophen]   Social History:  The patient  reports that she has never smoked. She has never used smokeless tobacco. She reports current alcohol use. She reports that she does not use drugs.   Family History:  The patient's family history includes Hypertension in her father and mother; Stroke in her mother.  ROS:  Please see the history of present illness.    All other systems are reviewed and otherwise negative.   PHYSICAL EXAM:  VS:  BP 134/80   Pulse (!) 56   Ht 5\' 1"  (1.549 m)   Wt 170 lb (77.1 kg)   SpO2 96%   BMI 32.12 kg/m  BMI: Body mass index is 32.12 kg/m. Well nourished, well developed, in no acute distress  HEENT: normocephalic, atraumatic  Neck: no JVD, carotid bruits or masses Cardiac:  RRR; no significant murmurs, no rubs, or gallops Lungs:  CTA b/l, no wheezing, rhonchi or rales  Abd: soft, nontender MS: no deformity oratrophy Ext: no edema  Skin: warm and dry, no rash Neuro:  No gross deficits appreciated Psych: euthymic mood, full affect  ILR site steri strips are removed without difficulty.  Very mild skin erythema at the streri strip edges.  Site is healing well.  No open areas, no drainage, bleeding, hematoma.  No fluctuation, or increased heat to the surrounding tissues.   EKG:  Not done today  Loop interrogation done to day and reviewed by myself:  R wave 0.22mv, no arrhythmias SR   04/06/2019: TTE IMPRESSIONS  1. Left ventricular ejection fraction, by estimation, is 65 to 70%. The  left ventricle has normal function. The left ventricle has no regional  wall motion abnormalities. Left ventricular diastolic parameters are  indeterminate.   2. Right ventricular systolic function is normal. The right ventricular  size is normal. Tricuspid regurgitation signal is inadequate for assessing  PA pressure.  3. The mitral valve is normal in structure. No evidence of mitral valve  regurgitation.  4. The aortic valve was not well visualized. Aortic valve regurgitation  is not visualized. No aortic stenosis is present.  5. The inferior vena cava is normal in size with greater than 50%  respiratory variability, suggesting right atrial pressure of 3 mmHg.     Recent Labs: 04/05/2019: ALT 17; Hemoglobin 13.9; Platelets 290; TSH 0.923 04/07/2019: BUN 25; Creatinine, Ser 0.93; Potassium 3.8; Sodium 140  04/06/2019: Cholesterol 211; HDL 51; LDL Cholesterol 139; Total CHOL/HDL Ratio 4.1; Triglycerides 107; VLDL 21   Estimated Creatinine Clearance: 48.3 mL/min (by C-G formula based on SCr of 0.93 mg/dL).   Wt Readings from Last 3 Encounters:  04/14/19 170 lb (77.1 kg)  04/05/19 169 lb 15.6 oz (77.1 kg)  02/08/19 170 lb 1 oz (77.1 kg)     Other studies reviewed: Additional studies/records reviewed today include: summarized above  ASSESSMENT AND PLAN:  1. Cryptogenic stroke 2. Loop in place     Discussed monitoring going forwards     Site is healing well, no open areas, no evidence of infection.     Disposition: F/u with monthly remotes, see her in clinic as needed    Current medicines are reviewed at length with the patient today.  The patient did not have any concerns regarding medicines.  Venetia Night, PA-C 04/14/2019 12:57 PM     [facility] [place]  [phone] (office)  [phone] (fax)

## 2019-04-14 ENCOUNTER — Other Ambulatory Visit: Payer: Self-pay

## 2019-04-14 ENCOUNTER — Ambulatory Visit (INDEPENDENT_AMBULATORY_CARE_PROVIDER_SITE_OTHER): Payer: Medicare Other | Admitting: Physician Assistant

## 2019-04-14 VITALS — BP 134/80 | HR 56 | Ht 61.0 in | Wt 170.0 lb

## 2019-04-14 DIAGNOSIS — Z4509 Encounter for adjustment and management of other cardiac device: Secondary | ICD-10-CM | POA: Diagnosis not present

## 2019-04-14 DIAGNOSIS — I639 Cerebral infarction, unspecified: Secondary | ICD-10-CM

## 2019-04-14 DIAGNOSIS — Z5189 Encounter for other specified aftercare: Secondary | ICD-10-CM

## 2019-04-14 NOTE — Patient Instructions (Signed)
Medication Instructions:   Your physician recommends that you continue on your current medications as directed. Please refer to the Current Medication list given to you today.  *If you need a refill on your cardiac medications before your next appointment, please call your pharmacy*   Lab Work:  [place]   If you have labs (blood work) drawn today and your tests are completely normal, you will receive your results only by: Marland Kitchen MyChart Message (if you have MyChart) OR . A paper copy in the mail If you have any lab test that is abnormal or we need to change your treatment, we will call you to review the results.   Testing/Procedures:  NONE ORDERED  TODAY   Follow-Up: At [facility], you and your health needs are our priority.  As part of our continuing mission to provide you with exceptional heart care, we have created designated Provider Care Teams.  These Care Teams include your primary Cardiologist (physician) and Advanced Practice Providers (APPs -  Physician Assistants and Nurse Practitioners) who all work together to provide you with the care you need, when you need it.  We recommend signing up for the patient portal called "MyChart".  Sign up information is provided on this After Visit Summary.  MyChart is used to connect with patients for Virtual Visits (Telemedicine).  Patients are able to view lab/test results, encounter notes, upcoming appointments, etc.  Non-urgent messages can be sent to your provider as well.   To learn more about what you can do with MyChart, go to [email or web address].    Your next appointment:  CONTACT [facility] [phone] AS NEEDED FOR  ANY CARDIAC RELATED SYMPTOMS   The format for your next appointment:   In Person  Provider:   You may see  or one of the following Advanced Practice Providers on your designated Care Team:    Chanetta Marshall, NP  Tommye Standard, PA-C  Legrand Como "[place], [place]    Other Instructions

## 2019-04-29 ENCOUNTER — Ambulatory Visit: Payer: Medicare Other

## 2019-04-29 ENCOUNTER — Other Ambulatory Visit: Payer: Self-pay

## 2019-04-29 ENCOUNTER — Ambulatory Visit
Admission: RE | Admit: 2019-04-29 | Discharge: 2019-04-29 | Disposition: A | Discharge status: Home / Self Care | Payer: Medicare Other | Source: Ambulatory Visit | Attending: Family Medicine | Admitting: Family Medicine

## 2019-04-29 DIAGNOSIS — R928 Other abnormal and inconclusive findings on diagnostic imaging of breast: Secondary | ICD-10-CM

## 2019-05-10 ENCOUNTER — Ambulatory Visit (INDEPENDENT_AMBULATORY_CARE_PROVIDER_SITE_OTHER): Payer: Medicare Other | Admitting: Adult Health

## 2019-05-10 ENCOUNTER — Ambulatory Visit (INDEPENDENT_AMBULATORY_CARE_PROVIDER_SITE_OTHER): Payer: Medicare Other | Admitting: *Deleted

## 2019-05-10 ENCOUNTER — Encounter: Payer: Self-pay | Admitting: Adult Health

## 2019-05-10 ENCOUNTER — Other Ambulatory Visit: Payer: Self-pay

## 2019-05-10 VITALS — BP 138/86 | HR 62 | Temp 97.8°F | Ht 61.0 in | Wt 169.0 lb

## 2019-05-10 DIAGNOSIS — Z950 Presence of cardiac pacemaker: Secondary | ICD-10-CM

## 2019-05-10 DIAGNOSIS — Z95818 Presence of other cardiac implants and grafts: Secondary | ICD-10-CM | POA: Diagnosis not present

## 2019-05-10 DIAGNOSIS — I639 Cerebral infarction, unspecified: Secondary | ICD-10-CM

## 2019-05-10 DIAGNOSIS — E785 Hyperlipidemia, unspecified: Secondary | ICD-10-CM | POA: Diagnosis not present

## 2019-05-10 LAB — CUP PACEART REMOTE DEVICE CHECK
Date Time Interrogation Session: 20210419215440
Implantable Pulse Generator Implant Date: 20210318

## 2019-05-10 NOTE — Progress Notes (Signed)
[facility] [address] [place]. [place] [address] [phone]       HOSPITAL FOLLOW UP NOTE  [name] Date of Birth:  12-21-1945 Medical Record Number:  [id number]   Reason for Referral:  hospital stroke follow up    SUBJECTIVE:   CHIEF COMPLAINT:  Chief Complaint  Patient presents with  . Follow-up    hospital fu, treatment rm, with husband, pt has no complaints     HPI:   [name] is a 73 y.o. female with history of uterine cancer  who presented on 04/05/2019 with transient dizziness, L>R leg weakness and B arm weakness while walking in [employer].  Stroke work-up revealed 3-4 punctate bilateral MCA/ACA watershed infarcts embolic secondary to unknown source.  Loop recorder placed to rule out atrial fibrillation as potential etiology.  Recommended DAPT for 3 weeks and aspirin alone as previously not on antithrombotic.  No prior history of HTN.  LDL 139 initiate atorvastatin 40 mg daily.  No evidence or history of DM with A1c 5.7.  Other stroke risk factors include advanced age, EtOH use, family history of stroke and prior strokes on imaging.  She was discharged home in stable condition without therapy needs.  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 ASPECTS 10.     CTA head & neck no LVO. Mild R M1/MCA stenosis. Small distal supraclinoid R ICA aneurysm.   MRI  3-4 punctate B MCA/ACA watershed infarcts. Multiple old infarct. Chronic ischemic microangiopathy.  LE Doppler  No DVT  2D Echo EF 65-70%  Loop recorder placed  LDL 139 -initiated atorvastatin 40 mg daily  HgbA1c 5.7  No HTN hx  Lovenox 40 mg sq daily for VTE prophylaxis  No antithrombotic prior to admission, now on aspirin 81 mg daily and clopidogrel 75 mg daily. Continue DAPT x 3 weeks then aspirin alone.    Therapy recommendations:   No therapy  needs  Disposition:   Home  Today, 05/10/2019, [name] is being seen for hospital follow-up accompanied by her husband.  She has been stable from a stroke standpoint without residual deficits or new/reoccurring stroke/TIA symptoms.  Completed 3 weeks DAPT and continues on aspirin alone without bleeding or bruising.  Continues on atorvastatin 40 mg daily without myalgias.  Blood pressure today 130/86.  Loop recorder has not shown atrial fibrillation thus far.  No concerns at this time.    ROS:   14 system review of systems performed and negative with exception of no complaints  PMH:  Past Medical History:  Diagnosis Date  . Stroke ([place]) 04/06/2019  . Uterine cancer ([place]) 01/25/2016    PSH:  Past Surgical History:  Procedure Laterality Date  . BREAST EXCISIONAL BIOPSY Left 1975  . LOOP RECORDER INSERTION N/A 04/07/2019   Procedure: LOOP RECORDER INSERTION;  Surgeon: Evans Lance, MD;  Location: [facility];  Service: Cardiovascular;  Laterality: N/A;  . TUBAL LIGATION  1970    Social History:  Social History   Socioeconomic History  . Marital status: Married    Spouse name: Not on file  . Number of children: Not on file  . Years of education: Not on file  . Highest education level: Not on file  Occupational History  . Not on file  Tobacco Use  . Smoking status: Never Smoker  . Smokeless tobacco: Never Used  Substance and Sexual Activity  .  Alcohol use: Yes    Comment: occasional drinker  . Drug use: Never  . Sexual activity: Not on file  Other Topics Concern  . Not on file  Social History Narrative  . Not on file   Social Determinants of Health   Financial Resource Strain:   . Difficulty of Paying Living Expenses:   Food Insecurity:   . Worried About Charity fundraiser in the Last Year:   . Arboriculturist in the Last Year:   Transportation Needs:   . Film/video editor (Medical):   Marland Kitchen Lack of Transportation (Non-Medical):   Physical  Activity:   . Days of Exercise per Week:   . Minutes of Exercise per Session:   Stress:   . Feeling of Stress :   Social Connections:   . Frequency of Communication with Friends and Family:   . Frequency of Social Gatherings with Friends and Family:   . Attends Religious Services:   . Active Member of Clubs or Organizations:   . Attends Archivist Meetings:   Marland Kitchen Marital Status:   Intimate Partner Violence:   . Fear of Current or Ex-Partner:   . Emotionally Abused:   Marland Kitchen Physically Abused:   . Sexually Abused:     Family History:  Family History  Problem Relation Age of Onset  . Hypertension Mother   . Stroke Mother   . Hypertension Father     Medications:   Current Outpatient Medications on File Prior to Visit  Medication Sig Dispense Refill  . aspirin EC 81 MG EC tablet Take 1 tablet (81 mg total) by mouth daily.    Marland Kitchen atorvastatin (LIPITOR) 40 MG tablet Take 1 tablet (40 mg total) by mouth daily at 6 PM. 30 tablet 0  . betamethasone dipropionate (DIPROLENE) 0.05 % ointment Apply 1 application topically daily.    . Cholecalciferol (VITAMIN D3 PO) Take 25 mg by mouth daily.    . fexofenadine (ALLEGRA) 180 MG tablet Take 180 mg by mouth daily.    . Magnesium Oxide 400 MG CAPS Take 1 capsule by mouth daily.    . Multiple Vitamins-Minerals (CENTRUM SILVER 50+WOMEN PO) Take 1 tablet by mouth daily.    . SUPER B COMPLEX/C PO Take 1 tablet by mouth daily.     No current facility-administered medications on file prior to visit.    Allergies:   Allergies  Allergen Reactions  . Codeine Nausea And Vomiting  . Tylenol [Acetaminophen] Hives      OBJECTIVE:  Physical Exam  Vitals:   05/10/19 1001  BP: 138/86  Pulse: 62  Temp: 97.8 F (36.6 C)  Weight: 169 lb (76.7 kg)  Height: 5\' 1"  (1.549 m)   Body mass index is 31.93 kg/m. No exam data present  Post stroke PHQ 2/9  Depression screen PHQ 2/9 05/10/2019  Decreased Interest 0  Down, Depressed, Hopeless 0   PHQ - 2 Score 0      Physical exam  General: well developed, well nourished, very pleasant elderly Caucasian female, seated, in no evident distress Head: head normocephalic and atraumatic.   Neck: supple with no carotid or supraclavicular bruits Cardiovascular: regular rate and rhythm, no murmurs Musculoskeletal: no deformity Skin:  no rash/petichiae Vascular:  Normal pulses all extremities   Neurologic Exam Mental Status: Awake and fully alert.   Normal speech and language.  Oriented to place and time. Recent and remote memory intact. Attention span, concentration and fund of knowledge appropriate. Mood and  affect appropriate.  Cranial Nerves: Fundoscopic exam reveals sharp disc margins. Pupils equal, briskly reactive to light. Extraocular movements full without nystagmus. Visual fields full to confrontation. Hearing intact. Facial sensation intact. Face, tongue, palate moves normally and symmetrically.  Motor: Normal bulk and tone. Normal strength in all tested extremity muscles. Sensory.: intact to touch , pinprick , position and vibratory sensation.  Coordination: Rapid alternating movements normal in all extremities. Finger-to-nose and heel-to-shin performed accurately bilaterally. Gait and Station: Arises from chair without difficulty. Stance is normal. Gait demonstrates normal stride length and balance Reflexes: 1+ and symmetric. Toes downgoing.     NIHSS  0 Modified Rankin  0      ASSESSMENT: Lisa [name] is a 74 y.o. year old female presented with transient dizziness, L>R leg weakness and bilateral arm weakness on 04/05/2019 with stroke work-up revealing 3-4 punctate bilateral MCA/ACA watershed infarcts embolic secondary to unknown source therefore loop recorder placed to rule out atrial fibrillation as potential etiology.  Loop recorder has not shown atrial fibrillation thus far.  Vascular risk factors include HLD, mild intracranial stenosis, prior stroke on  imaging and advanced age.  Recovered well from a stroke standpoint without residual deficits     PLAN:  1. Bilateral MCA/ACA cryptogenic stroke:  -Continue aspirin 81 mg daily  and atorvastatin 40 mg daily for secondary stroke prevention. -Continue to monitor loop recorder for possible atrial fibrillation -Declined interest in [place] trial - Maintain strict control of hypertension with blood pressure goal below 130/90, diabetes with hemoglobin A1c goal below 6.5% and cholesterol with LDL cholesterol (bad cholesterol) goal below 70 mg/dL.  I also advised the patient to eat a healthy diet with plenty of whole grains, cereals, fruits and vegetables, exercise regularly with at least 30 minutes of continuous activity daily and maintain ideal body weight. 2. HLD: Repeat lipid panel and if satisfactory, will send in refills for atorvastatin 40 mg daily as requested but will make adjustments if needed based on results.  Request ongoing prescribing, monitoring and management through PCP     Follow up in 4 months or call earlier if needed   I spent 45 minutes of face-to-face and non-face-to-face time with patient and husband.  This included previsit chart review, lab review, study review, order entry, electronic health record documentation, patient education regarding recent stroke, residual deficits, importance of managing stroke risk factors and answered all questions to patient satisfaction     Frann Rider, [facility]  [facility] [address] [place], [place] [address]  Phone [phone] Fax [phone] Note: This document was prepared with digital dictation and possible smart phrase technology. Any transcriptional errors that result from this process are unintentional.

## 2019-05-10 NOTE — Patient Instructions (Addendum)
Continue aspirin 81 mg daily  and atorvastatin 40 mg daily for secondary stroke prevention  Continue to follow up with PCP regarding cholesterol management -repeat cholesterol levels today and if satisfactory, will send in refills for current dosage of atorvastatin.  If any changes will need to be made, we will call you tomorrow.  Request ongoing monitoring, management and prescribing be completed by PCP  Loop recorder will continue to be monitored for possible atrial fibrillation  Continue to monitor blood pressure at home  Maintain strict control of hypertension with blood pressure goal below 130/90, diabetes with hemoglobin A1c goal below 6.5% and cholesterol with LDL cholesterol (bad cholesterol) goal below 70 mg/dL. I also advised the patient to eat a healthy diet with plenty of whole grains, cereals, fruits and vegetables, exercise regularly and maintain ideal body weight.  Followup in the future with me in 4 months or call earlier if needed       Thank you for coming to see [place] at [facility]. I hope we have been able to provide you high quality care today.  You may receive a patient satisfaction survey over the next few weeks. We would appreciate your feedback and comments so that we may continue to improve ourselves and the health of our patients.

## 2019-05-10 NOTE — Progress Notes (Signed)
I agree with the above plan 

## 2019-05-11 ENCOUNTER — Other Ambulatory Visit: Payer: Self-pay | Admitting: Adult Health

## 2019-05-11 ENCOUNTER — Telehealth: Payer: Self-pay | Admitting: *Deleted

## 2019-05-11 LAB — LIPID PANEL
Chol/HDL Ratio: 2.4 ratio (ref 0.0–4.4)
Cholesterol, Total: 139 mg/dL (ref 100–199)
HDL: 59 mg/dL (ref >39)
LDL Chol Calc (NIH): 59 mg/dL (ref 0–99)
Triglycerides: 118 mg/dL (ref 0–149)
VLDL Cholesterol Cal: 21 mg/dL (ref 5–40)

## 2019-05-11 MED ORDER — ATORVASTATIN CALCIUM 40 MG PO TABS: 40.0000 mg | ORAL_TABLET | Freq: Every day | ORAL | 1 refills | Status: AC

## 2019-05-11 NOTE — Telephone Encounter (Addendum)
Spoke with pt and discussed lipid panel lab results noted below and advised pt that 3 months plus 1 refill of Atorvastatin was sent to pharmacy. As discussed in office visit, pt will need to have further refills and monitoring of cholesterol by primary care. Pt verbalized understanding and appreciation.   ----- Message ----- From: Frann Rider, NP Sent: 05/11/2019   9:54 AM EDT  Please advise patient that recent LDL satisfactory at 59 and will place refill for atorvastatin 40 mg daily but as discussed during visit, future refills and monitoring of cholesterol levels will need to be provided by PCP.

## 2019-05-11 NOTE — Progress Notes (Signed)
ILR Remote 

## 2019-06-13 ENCOUNTER — Ambulatory Visit (INDEPENDENT_AMBULATORY_CARE_PROVIDER_SITE_OTHER): Payer: Medicare Other | Admitting: *Deleted

## 2019-06-13 DIAGNOSIS — I639 Cerebral infarction, unspecified: Secondary | ICD-10-CM

## 2019-06-13 LAB — CUP PACEART REMOTE DEVICE CHECK
Date Time Interrogation Session: 20210523230402
Implantable Pulse Generator Implant Date: 20210318

## 2019-06-14 NOTE — Progress Notes (Signed)
Carelink Summary Report / Loop Recorder 

## 2019-07-18 ENCOUNTER — Ambulatory Visit (INDEPENDENT_AMBULATORY_CARE_PROVIDER_SITE_OTHER): Payer: Medicare Other | Admitting: *Deleted

## 2019-07-18 DIAGNOSIS — I639 Cerebral infarction, unspecified: Secondary | ICD-10-CM | POA: Diagnosis not present

## 2019-07-18 LAB — CUP PACEART REMOTE DEVICE CHECK
Date Time Interrogation Session: 20210627230554
Implantable Pulse Generator Implant Date: 20210318

## 2019-07-20 NOTE — Progress Notes (Signed)
Carelink Summary Report / Loop Recorder 

## 2019-08-15 ENCOUNTER — Encounter: Payer: Self-pay | Admitting: Gynecologic Oncology

## 2019-08-15 ENCOUNTER — Inpatient Hospital Stay: Payer: Medicare Other | Attending: Gynecologic Oncology | Admitting: Gynecologic Oncology

## 2019-08-15 ENCOUNTER — Other Ambulatory Visit: Payer: Self-pay

## 2019-08-15 VITALS — BP 154/82 | HR 83 | Temp 98.2°F | Resp 16 | Ht 61.0 in | Wt 170.6 lb

## 2019-08-15 DIAGNOSIS — C541 Malignant neoplasm of endometrium: Secondary | ICD-10-CM

## 2019-08-15 DIAGNOSIS — Z90722 Acquired absence of ovaries, bilateral: Secondary | ICD-10-CM | POA: Diagnosis not present

## 2019-08-15 DIAGNOSIS — Z8542 Personal history of malignant neoplasm of other parts of uterus: Secondary | ICD-10-CM | POA: Diagnosis not present

## 2019-08-15 DIAGNOSIS — Z90710 Acquired absence of both cervix and uterus: Secondary | ICD-10-CM | POA: Diagnosis not present

## 2019-08-15 DIAGNOSIS — Z08 Encounter for follow-up examination after completed treatment for malignant neoplasm: Secondary | ICD-10-CM

## 2019-08-15 DIAGNOSIS — Z8543 Personal history of malignant neoplasm of ovary: Secondary | ICD-10-CM | POA: Diagnosis present

## 2019-08-15 NOTE — Progress Notes (Signed)
Return Patient Note: Gyn-Onc  Lisa [name] 74 y.o. female  CC:  Chief Complaint  Patient presents with  . Endometrial cancer [facility])    follow up    Assessment/Plan:  [name]  is a 74 y.o.  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 MSI stable.  She is NED on today's exam. Will continue 6 monthly evaluations.  HPI: [name] is a 74 year old parous woman who was diagnosed with a grade 2 endometrioid adenocarcinoma her in January 2018.  At that time she was living in [facility].  On March 10, 2016 Dr. Ronal Fear performed a laparoscopy converted to laparotomy, modified radical abdominal hysterectomy, BSO, retroperitoneal pelvic lymphadenectomy.  Periaortic sampling also took place.  Final pathology revealed a 2 cm grade 2 endometrioid endometrial adenocarcinoma with 70% myometrial invasion present.  There is not a comment in the pathology report of lymphovascular space invasion.  11 lymph nodes were sampled including 4 from the left pelvic 7 from the right pelvic 1 from the left aortic and 2 from the right aortic.  All were negative for apparent metastases.  The ovaries tubes and cervix were free of disease.  She was determined to have high intermediate risk factors based on her uterine pathology and was recommended adjuvant vaginal brachytherapy.  The patient states that she declined this because she was told by her surgeon that he was "sure I got it all out".  She did however receive [employer] chemo resistance assay testing which revealed that her tumor would be sensitive to carboplatin paclitaxel in addition to multiple other agents.  Genomic analysis revealed intact nuclear expression of mL H1 [place] 2 and [place] 6 and PMS 2 reflecting no loss of expression of mismatch repair proteins and a microsatellite stable  tumor.  She relocated from [place] to [place].   She was seen by [facility] and treated for urinary urge incontinence with Mybetric which has significantly improved symptoms.  Interval Hx:  She has no symptoms concerning for recurrence.   She has no questions or concerns.   Current Meds:  Outpatient Encounter Medications as of 08/15/2019  Medication Sig  . aspirin EC 81 MG EC tablet Take 1 tablet (81 mg total) by mouth daily.  Marland Kitchen atorvastatin (LIPITOR) 40 MG tablet Take 1 tablet (40 mg total) by mouth daily at 6 PM.  . betamethasone dipropionate (DIPROLENE) 0.05 % ointment Apply 1 application topically daily.  . Cholecalciferol (VITAMIN D3 PO) Take 25 mg by mouth daily.  . fexofenadine (ALLEGRA) 180 MG tablet Take 180 mg by mouth daily.  . Magnesium Oxide 400 MG CAPS Take 1 capsule by mouth daily.  . Multiple Vitamins-Minerals (CENTRUM SILVER 50+WOMEN PO) Take 1 tablet by mouth daily.  . SUPER B COMPLEX/C PO Take 1 tablet by mouth daily.   No facility-administered encounter medications on file as of 08/15/2019.    Allergy:  Allergies  Allergen Reactions  . Codeine Nausea And Vomiting  . Tylenol [Acetaminophen] Hives    Social Hx:   Social History   Socioeconomic History  . Marital status: Married    Spouse name: Not on file  . Number of children: Not on file  . Years of education: Not on file  . Highest education level: Not on file  Occupational History  . Not on file  Tobacco Use  . Smoking status: Never Smoker  .  Smokeless tobacco: Never Used  Vaping Use  . Vaping Use: Never used  Substance and Sexual Activity  . Alcohol use: Yes    Comment: occasional drinker  . Drug use: Never  . Sexual activity: Not on file  Other Topics Concern  . Not on file  Social History Narrative  . Not on file   Social Determinants of Health   Financial Resource Strain:   . Difficulty of Paying Living Expenses:   Food Insecurity:   . Worried About Sales executive in the Last Year:   . Arboriculturist in the Last Year:   Transportation Needs:   . Film/video editor (Medical):   Marland Kitchen Lack of Transportation (Non-Medical):   Physical Activity:   . Days of Exercise per Week:   . Minutes of Exercise per Session:   Stress:   . Feeling of Stress :   Social Connections:   . Frequency of Communication with Friends and Family:   . Frequency of Social Gatherings with Friends and Family:   . Attends Religious Services:   . Active Member of Clubs or Organizations:   . Attends Archivist Meetings:   Marland Kitchen Marital Status:   Intimate Partner Violence:   . Fear of Current or Ex-Partner:   . Emotionally Abused:   Marland Kitchen Physically Abused:   . Sexually Abused:     Past Surgical Hx:  Past Surgical History:  Procedure Laterality Date  . BREAST EXCISIONAL BIOPSY Left 1975  . LOOP RECORDER INSERTION N/A 04/07/2019   Procedure: LOOP RECORDER INSERTION;  Surgeon: Evans Lance, MD;  Location: [place] [facility];  Service: Cardiovascular;  Laterality: N/A;  . TUBAL LIGATION  1970    Past Medical Hx:  Past Medical History:  Diagnosis Date  . Stroke ([place]) 04/06/2019  . Uterine cancer ([place]) 01/25/2016    Past Gynecological History:  Endometrial cancer (see above) No LMP recorded. Patient is postmenopausal.  Family Hx:  Family History  Problem Relation Age of Onset  . Hypertension Mother   . Stroke Mother   . Hypertension Father     Review of Systems:  Constitutional  Feels well,    ENT Normal appearing ears and nares bilaterally Skin/Breast  No rash, sores, jaundice, itching, dryness Cardiovascular  No chest pain, shortness of breath, or edema  Pulmonary  No cough or wheeze.  Gastro Intestinal  No nausea, vomitting, or diarrhoea. No bright red blood per rectum, no abdominal pain, change in bowel movement, or constipation.  Genito Urinary  No frequency, urgency, dysuria, no bleeding + urinary incontinence - much better on Mybetric.   Musculo Skeletal  No myalgia, arthralgia, joint swelling or pain  Neurologic  No weakness, numbness, change in gait,  Psychology  No depression, anxiety, insomnia.   Vitals:  Blood pressure (!) 154/82, pulse 83, temperature 98.2 F (36.8 C), temperature source Temporal, resp. rate 16, height _0  (1.549 m), weight 170 lb 9.6 oz (77.4 kg), SpO2 100 %.  Physical Exam: WD in NAD Neck  Supple NROM, without any enlargements.  Lymph Node Survey No cervical supraclavicular or inguinal adenopathy Cardiovascular  Pulse normal rate, regularity and rhythm. S1 and S2 normal.  Lungs  Clear to auscultation bilateraly, without wheezes/crackles/rhonchi. Good air movement.  Skin  No rash/lesions/breakdown  Psychiatry  Alert and oriented to person, place, and time  Abdomen  Normoactive bowel sounds, abdomen soft, non-tender and nonobese without evidence of hernia. Back No CVA tenderness Genito Urinary  Vulva/vagina: Normal external female genitalia.  No lesions. No discharge or bleeding.  Bladder/urethra:  No lesions or masses, well supported bladder  Vagina: normal, free of lesions,   Cervix and uterus : surgically absent  Rectal  deferred Extremities  No bilateral cyanosis, clubbing or edema.   Thereasa Solo, MD  08/15/2019, 1:53 PM

## 2019-08-15 NOTE — Patient Instructions (Signed)
Please notify Dr Denman George at phone number [phone] if you notice vaginal bleeding, new pelvic or abdominal pains, bloating, feeling full easy, or a change in bladder or bowel function.   Please contact Dr Serita Grit office (at [phone]) in October to request an appointment with her for January, 2022.

## 2019-08-22 ENCOUNTER — Ambulatory Visit (INDEPENDENT_AMBULATORY_CARE_PROVIDER_SITE_OTHER): Payer: Medicare Other | Admitting: *Deleted

## 2019-08-22 DIAGNOSIS — I639 Cerebral infarction, unspecified: Secondary | ICD-10-CM

## 2019-08-23 LAB — CUP PACEART REMOTE DEVICE CHECK
Date Time Interrogation Session: 20210730230401
Implantable Pulse Generator Implant Date: 20210318

## 2019-08-24 NOTE — Progress Notes (Signed)
Carelink Summary Report / Loop Recorder 

## 2019-09-20 ENCOUNTER — Ambulatory Visit: Payer: Medicare Other | Admitting: Adult Health

## 2019-09-22 ENCOUNTER — Ambulatory Visit (INDEPENDENT_AMBULATORY_CARE_PROVIDER_SITE_OTHER): Payer: Medicare Other | Admitting: *Deleted

## 2019-09-22 DIAGNOSIS — I63413 Cerebral infarction due to embolism of bilateral middle cerebral arteries: Secondary | ICD-10-CM

## 2019-09-22 LAB — CUP PACEART REMOTE DEVICE CHECK
Date Time Interrogation Session: 20210901230518
Implantable Pulse Generator Implant Date: 20210318

## 2019-09-27 ENCOUNTER — Ambulatory Visit (INDEPENDENT_AMBULATORY_CARE_PROVIDER_SITE_OTHER): Payer: Medicare Other | Admitting: Adult Health

## 2019-09-27 ENCOUNTER — Encounter: Payer: Self-pay | Admitting: Adult Health

## 2019-09-27 ENCOUNTER — Other Ambulatory Visit: Payer: Self-pay

## 2019-09-27 VITALS — BP 151/76 | HR 80 | Ht 61.0 in | Wt 165.6 lb

## 2019-09-27 DIAGNOSIS — E785 Hyperlipidemia, unspecified: Secondary | ICD-10-CM

## 2019-09-27 DIAGNOSIS — I639 Cerebral infarction, unspecified: Secondary | ICD-10-CM | POA: Diagnosis not present

## 2019-09-27 NOTE — Progress Notes (Signed)
[facility] [address] [place]. [place] [address] [phone]       STROKE FOLLOW UP NOTE  [name] Date of Birth:  November 26, 1945 Medical Record Number:  [id number]   Reason for Referral: stroke follow up    SUBJECTIVE:   CHIEF COMPLAINT:  Chief Complaint  Patient presents with  . Follow-up    5 month f/u. States she has been doing well since last visit. Denies any new issues.   . room 4    alone     HPI:   Today, 09/27/2019, Lisa [name] returns for stroke follow-up  Reports she has been stable since prior visit without residual stroke deficits and denies new or reoccurring stroke/TIA symptoms  Remains on aspirin 81 mg daily without bleeding or bruising Remains on atorvastatin 40 mg but reports only taking 1-2 times weekly due to concern of possible statin side effects but has not actually experienced side effects personally Lipid panel obtained after prior visit on 05/10/2019 which showed LDL 59 Blood pressure today 151/76 -monitors at home and typically stable  Loop recorder has not shown atrial fibrillation thus far  No concerns at this time    History provided for reference purposes only Update 05/10/2019 JM: Lisa [name] is being seen for hospital follow-up accompanied by her husband.  She has been stable from a stroke standpoint without residual deficits or new/reoccurring stroke/TIA symptoms.  Completed 3 weeks DAPT and continues on aspirin alone without bleeding or bruising.  Continues on atorvastatin 40 mg daily without myalgias.  Blood pressure today 130/86.  Loop recorder has not shown atrial fibrillation thus far.  No concerns at this time.  Stroke admission 04/05/2019 Lisa [name] is a 74 y.o. female with history of uterine cancer  who presented on 04/05/2019 with transient dizziness, L>R leg weakness and B arm weakness while walking in [employer].  Stroke work-up revealed 3-4 punctate bilateral MCA/ACA watershed  infarcts embolic secondary to unknown source.  Loop recorder placed to rule out atrial fibrillation as potential etiology.  Recommended DAPT for 3 weeks and aspirin alone as previously not on antithrombotic.  No prior history of HTN.  LDL 139 initiate atorvastatin 40 mg daily.  No evidence or history of DM with A1c 5.7.  Other stroke risk factors include advanced age, EtOH use, family history of stroke and prior strokes on imaging.  She was discharged home in stable condition without therapy needs.  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 ASPECTS 10.     CTA head & neck no LVO. Mild R M1/MCA stenosis. Small distal supraclinoid R ICA aneurysm.   MRI  3-4 punctate B MCA/ACA watershed infarcts. Multiple old infarct. Chronic ischemic microangiopathy.  LE Doppler  No DVT  2D Echo EF 65-70%  Loop recorder placed  LDL 139 -initiated atorvastatin 40 mg daily  HgbA1c 5.7  No HTN hx  Lovenox 40 mg sq daily for VTE prophylaxis  No antithrombotic prior to admission, now on aspirin 81 mg daily and clopidogrel 75 mg daily. Continue DAPT x 3 weeks then aspirin alone.    Therapy recommendations:   No therapy needs  Disposition:   Home      ROS:   14 system review of systems performed and negative with exception of no complaints  PMH:  Past Medical History:  Diagnosis Date  . Stroke ([place]) 04/06/2019  . Uterine cancer ([place]) 01/25/2016  PSH:  Past Surgical History:  Procedure Laterality Date  . BREAST EXCISIONAL BIOPSY Left 1975  . LOOP RECORDER INSERTION N/A 04/07/2019   Procedure: LOOP RECORDER INSERTION;  Surgeon: Evans Lance, MD;  Location: [place] [facility];  Service: Cardiovascular;  Laterality: N/A;  . TUBAL LIGATION  1970    Social History:  Social History   Socioeconomic History  . Marital status: Married    Spouse name: Not on file  . Number of children: Not on file   . Years of education: Not on file  . Highest education level: Not on file  Occupational History  . Not on file  Tobacco Use  . Smoking status: Never Smoker  . Smokeless tobacco: Never Used  Vaping Use  . Vaping Use: Never used  Substance and Sexual Activity  . Alcohol use: Yes    Comment: occasional drinker  . Drug use: Never  . Sexual activity: Not on file  Other Topics Concern  . Not on file  Social History Narrative  . Not on file   Social Determinants of Health   Financial Resource Strain:   . Difficulty of Paying Living Expenses: Not on file  Food Insecurity:   . Worried About Charity fundraiser in the Last Year: Not on file  . Ran Out of Food in the Last Year: Not on file  Transportation Needs:   . Lack of Transportation (Medical): Not on file  . Lack of Transportation (Non-Medical): Not on file  Physical Activity:   . Days of Exercise per Week: Not on file  . Minutes of Exercise per Session: Not on file  Stress:   . Feeling of Stress : Not on file  Social Connections:   . Frequency of Communication with Friends and Family: Not on file  . Frequency of Social Gatherings with Friends and Family: Not on file  . Attends Religious Services: Not on file  . Active Member of Clubs or Organizations: Not on file  . Attends Archivist Meetings: Not on file  . Marital Status: Not on file  Intimate Partner Violence:   . Fear of Current or Ex-Partner: Not on file  . Emotionally Abused: Not on file  . Physically Abused: Not on file  . Sexually Abused: Not on file    Family History:  Family History  Problem Relation Age of Onset  . Hypertension Mother   . Stroke Mother   . Hypertension Father     Medications:   Current Outpatient Medications on File Prior to Visit  Medication Sig Dispense Refill  . aspirin EC 81 MG EC tablet Take 1 tablet (81 mg total) by mouth daily.    Marland Kitchen atorvastatin (LIPITOR) 40 MG tablet Take 1 tablet (40 mg total) by mouth daily at  6 PM. 90 tablet 1  . betamethasone dipropionate (DIPROLENE) 0.05 % ointment Apply 1 application topically daily.    . Cholecalciferol (VITAMIN D3 PO) Take 25 mg by mouth daily.    . fexofenadine (ALLEGRA) 180 MG tablet Take 180 mg by mouth daily.    . Magnesium Oxide 400 MG CAPS Take 1 capsule by mouth daily.    . Multiple Vitamins-Minerals (CENTRUM SILVER 50+WOMEN PO) Take 1 tablet by mouth daily.    . SUPER B COMPLEX/C PO Take 1 tablet by mouth daily.     No current facility-administered medications on file prior to visit.    Allergies:   Allergies  Allergen Reactions  . Codeine Nausea And Vomiting  .  Tylenol [Acetaminophen] Hives      OBJECTIVE:  Physical Exam  Vitals:   09/27/19 1030  BP: (!) 151/76  Pulse: 80  Weight: 165 lb 9.6 oz (75.1 kg)  Height: 5\' 1"  (1.549 m)   Body mass index is 31.29 kg/m. No exam data present   Physical exam  General: well developed, well nourished, very pleasant elderly Caucasian female, seated, in no evident distress Head: head normocephalic and atraumatic.   Neck: supple with no carotid or supraclavicular bruits Cardiovascular: regular rate and rhythm, no murmurs Musculoskeletal: no deformity Skin:  no rash/petichiae Vascular:  Normal pulses all extremities   Neurologic Exam Mental Status: Awake and fully alert.   Fluent speech and language.  Oriented to place and time. Recent and remote memory intact. Attention span, concentration and fund of knowledge appropriate. Mood and affect appropriate.  Cranial Nerves: Pupils equal, briskly reactive to light. Extraocular movements full without nystagmus. Visual fields full to confrontation. Hearing intact. Facial sensation intact. Face, tongue, palate moves normally and symmetrically.  Motor: Normal bulk and tone. Normal strength in all tested extremity muscles. Sensory.: intact to touch , pinprick , position and vibratory sensation.  Coordination: Rapid alternating movements normal in all  extremities. Finger-to-nose and heel-to-shin performed accurately bilaterally. Gait and Station: Arises from chair without difficulty. Stance is normal. Gait demonstrates normal stride length and balance Reflexes: 1+ and symmetric. Toes downgoing.       ASSESSMENT: Lisa [name] is a 74 y.o. year old female presented with transient dizziness, L>R leg weakness and bilateral arm weakness on 04/05/2019 with stroke work-up revealing 3-4 punctate bilateral MCA/ACA watershed infarcts embolic secondary to unknown source therefore loop recorder placed to rule out atrial fibrillation as potential etiology.   Vascular risk factors include HLD, mild intracranial stenosis, prior stroke on imaging and advanced age.      PLAN:  1. Bilateral MCA/ACA cryptogenic stroke:  -Recovered well without residual deficits -Continue aspirin 81 mg daily for secondary stroke prevention -We will repeat lipid panel today as she has only been taking atorvastatin 40 mg 1-2 times weekly due to concern of possible statin side effect (denies actually experiencing any side effects).  Discussed importance of daily use of atorvastatin for secondary stroke prevention but she remains hesitant despite acknowledging increased risk.  If LDL above goal of 70, will suggest use of Zetia for secondary stroke prevention measures -Loop recorder has not shown atrial fibrillation thus far.  Personally reviewed monthly reports -Discussed importance of close PCP follow-up for aggressive stroke risk factor management 2. HLD: LDL goal <70.  LDL 59 (05/10/2019).  Currently prescribed atorvastatin 40 mg daily only taking 1-2 times weekly.  See above for additional information.  Future monitoring and management by PCP   Overall stable from stroke standpoint and recommend follow-up on an as-needed basis   I spent 25 minutes of face-to-face and non-face-to-face time with patient.  This included previsit chart review, lab review, study review,  order entry, electronic health record documentation, patient education regarding prior stroke, monitoring of loop recorder, importance of managing stroke risk factors including use of prescribed medications and answered all questions to patient satisfaction   Frann Rider, [facility]  [facility] [address] [place], [place] [address]  Phone [phone] Fax [phone] Note: This document was prepared with digital dictation and possible smart phrase technology. Any transcriptional errors that result from this process are unintentional.

## 2019-09-27 NOTE — Patient Instructions (Addendum)
Your loop recorder will continue to be monitored for possible atrial fibrillation  Continue aspirin 81 mg daily  and atorvastatin for secondary stroke prevention We will check your cholesterol levels today If elevated, would recommend daily use of your current cholesterol medication or may trial use of a medication called Zetia (additional information provided below)  Continue to follow up with PCP regarding cholesterol and blood pressure management  Maintain strict control of hypertension with blood pressure goal below 130/90 and cholesterol with LDL cholesterol (bad cholesterol) goal below 70 mg/dL.     Overall stable from stroke standpoint therefore recommend follow-up on an as-needed basis      Thank you for coming to see [place] at [facility]. I hope we have been able to provide you high quality care today.  You may receive a patient satisfaction survey over the next few weeks. We would appreciate your feedback and comments so that we may continue to improve ourselves and the health of our patients.     Stroke Prevention Some medical conditions and behaviors are associated with a higher chance of having a stroke. You can help prevent a stroke by making nutrition, lifestyle, and other changes, including managing any medical conditions you may have. What nutrition changes can be made?   Eat healthy foods. You can do this by: ? Choosing foods high in fiber, such as fresh fruits and vegetables and whole grains. ? Eating at least 5 or more servings of fruits and vegetables a day. Try to fill half of your plate at each meal with fruits and vegetables. ? Choosing lean protein foods, such as lean cuts of meat, poultry without skin, fish, tofu, beans, and nuts. ? Eating low-fat dairy products. ? Avoiding foods that are high in salt (sodium). This can help lower blood pressure. ? Avoiding foods that have saturated fat, trans fat, and cholesterol. This can help prevent  high cholesterol. ? Avoiding processed and premade foods.  Follow your health care provider's specific guidelines for losing weight, controlling high blood pressure (hypertension), lowering high cholesterol, and managing diabetes. These may include: ? Reducing your daily calorie intake. ? Limiting your daily sodium intake to 1,500 milligrams (mg). ? Using only healthy fats for cooking, such as olive oil, canola oil, or sunflower oil. ? Counting your daily carbohydrate intake. What lifestyle changes can be made?  Maintain a healthy weight. Talk to your health care provider about your ideal weight.  Get at least 30 minutes of moderate physical activity at least 5 days a week. Moderate activity includes brisk walking, biking, and swimming.  Do not use any products that contain nicotine or tobacco, such as cigarettes and e-cigarettes. If you need help quitting, ask your health care provider. It may also be helpful to avoid exposure to secondhand smoke.  Limit alcohol intake to no more than 1 drink a day for nonpregnant women and 2 drinks a day for men. One drink equals 12 oz of beer, 5 oz of wine, or 1 oz of hard liquor.  Stop any illegal drug use.  Avoid taking birth control pills. Talk to your health care provider about the risks of taking birth control pills if: ? You are over 67 years old. ? You smoke. ? You get migraines. ? You have ever had a blood clot. What other changes can be made?  Manage your cholesterol levels. ? Eating a healthy diet is important for preventing high cholesterol. If cholesterol cannot be managed through diet alone, you may  also need to take medicines. ? Take any prescribed medicines to control your cholesterol as told by your health care provider.  Manage your diabetes. ? Eating a healthy diet and exercising regularly are important parts of managing your blood sugar. If your blood sugar cannot be managed through diet and exercise, you may need to take  medicines. ? Take any prescribed medicines to control your diabetes as told by your health care provider.  Control your hypertension. ? To reduce your risk of stroke, try to keep your blood pressure below 130/80. ? Eating a healthy diet and exercising regularly are an important part of controlling your blood pressure. If your blood pressure cannot be managed through diet and exercise, you may need to take medicines. ? Take any prescribed medicines to control hypertension as told by your health care provider. ? Ask your health care provider if you should monitor your blood pressure at home. ? Have your blood pressure checked every year, even if your blood pressure is normal. Blood pressure increases with age and some medical conditions.  Get evaluated for sleep disorders (sleep apnea). Talk to your health care provider about getting a sleep evaluation if you snore a lot or have excessive sleepiness.  Take over-the-counter and prescription medicines only as told by your health care provider. Aspirin or blood thinners (antiplatelets or anticoagulants) may be recommended to reduce your risk of forming blood clots that can lead to stroke.  Make sure that any other medical conditions you have, such as atrial fibrillation or atherosclerosis, are managed. What are the warning signs of a stroke? The warning signs of a stroke can be easily remembered as BEFAST.  B is for balance. Signs include: ? Dizziness. ? Loss of balance or coordination. ? Sudden trouble walking.  E is for eyes. Signs include: ? A sudden change in vision. ? Trouble seeing.  F is for face. Signs include: ? Sudden weakness or numbness of the face. ? The face or eyelid drooping to one side.  A is for arms. Signs include: ? Sudden weakness or numbness of the arm, usually on one side of the body.  S is for speech. Signs include: ? Trouble speaking (aphasia). ? Trouble understanding.  T is for time. ? These symptoms may  represent a serious problem that is an emergency. Do not wait to see if the symptoms will go away. Get medical help right away. Call your local emergency services (911 in the [place] Do not drive yourself to the hospital.  Other signs of stroke may include: ? A sudden, severe headache with no known cause. ? Nausea or vomiting. ? Seizure. Where to find more information For more information, visit:  American Stroke Association: www.strokeassociation.org  National Stroke Association: www.stroke.org Summary  You can prevent a stroke by eating healthy, exercising, not smoking, limiting alcohol intake, and managing any medical conditions you may have.  Do not use any products that contain nicotine or tobacco, such as cigarettes and e-cigarettes. If you need help quitting, ask your health care provider. It may also be helpful to avoid exposure to secondhand smoke.  Remember BEFAST for warning signs of stroke. Get help right away if you or a loved one has any of these signs. This information is not intended to replace advice given to you by your health care provider. Make sure you discuss any questions you have with your health care provider. Document Revised: 12/19/2016 Document Reviewed: 02/12/2016 Elsevier Patient Education  2020 [employer].  Ezetimibe Tablets What is this medicine? EZETIMIBE (ez ET i mibe) blocks the absorption of cholesterol from the stomach. It can help lower blood cholesterol for patients who are at risk of getting heart disease or a stroke. It is only for patients whose cholesterol level is not controlled by diet. This medicine may be used for other purposes; ask your health care provider or pharmacist if you have questions. COMMON BRAND NAME(S): Zetia What should I tell my health care provider before I take this medicine? They need to know if you have any of these conditions:  liver disease  an unusual or allergic reaction to ezetimibe, medicines, foods,  dyes, or preservatives  pregnant or trying to get pregnant  breast-feeding How should I use this medicine? Take this medicine by mouth with a glass of water. Follow the directions on the prescription label. This medicine can be taken with or without food. Take your doses at regular intervals. Do not take your medicine more often than directed. Talk to your pediatrician regarding the use of this medicine in children. Special care may be needed. Overdosage: If you think you have taken too much of this medicine contact a poison control center or emergency room at once. NOTE: This medicine is only for you. Do not share this medicine with others. What if I miss a dose? If you miss a dose, take it as soon as you can. If it is almost time for your next dose, take only that dose. Do not take double or extra doses. What may interact with this medicine? Do not take this medicine with any of the following medications:  fenofibrate  gemfibrozil This medicine may also interact with the following medications:  antacids  cyclosporine  herbal medicines like red yeast rice  other medicines to lower cholesterol or triglycerides This list may not describe all possible interactions. Give your health care provider a list of all the medicines, herbs, non-prescription drugs, or dietary supplements you use. Also tell them if you smoke, drink alcohol, or use illegal drugs. Some items may interact with your medicine. What should I watch for while using this medicine? Visit your doctor or health care professional for regular checks on your progress. You will need to have your cholesterol levels checked. If you are also taking some other cholesterol medicines, you will also need to have tests to make sure your liver is working properly. Tell your doctor or health care professional if you get any unexplained muscle pain, tenderness, or weakness, especially if you also have a fever and tiredness. You need to follow  a low-cholesterol, low-fat diet while you are taking this medicine. This will decrease your risk of getting heart and blood vessel disease. Exercising and avoiding alcohol and smoking can also help. Ask your doctor or dietician for advice. What side effects may I notice from receiving this medicine? Side effects that you should report to your doctor or health care professional as soon as possible:  allergic reactions like skin rash, itching or hives, swelling of the face, lips, or tongue  dark yellow or brown urine  unusually weak or tired  yellowing of the skin or eyes Side effects that usually do not require medical attention (report to your doctor or health care professional if they continue or are bothersome):  diarrhea  dizziness  headache  stomach upset or pain This list may not describe all possible side effects. Call your doctor for medical advice about side effects. You may report side effects to  FDA at 1-800-FDA-1088. Where should I keep my medicine? Keep out of the reach of children. Store at room temperature between 15 and 30 degrees C (59 and 86 degrees F). Protect from moisture. Keep container tightly closed. Throw away any unused medicine after the expiration date. NOTE: This sheet is a summary. It may not cover all possible information. If you have questions about this medicine, talk to your doctor, pharmacist, or health care provider.  2020 Elsevier/Gold Standard (2011-07-14 15:39:09)

## 2019-09-27 NOTE — Progress Notes (Signed)
I agree with the above plan 

## 2019-09-27 NOTE — Progress Notes (Signed)
Carelink Summary Report / Loop Recorder 

## 2019-09-28 LAB — LIPID PANEL
Chol/HDL Ratio: 2.5 ratio (ref 0.0–4.4)
Cholesterol, Total: 157 mg/dL (ref 100–199)
HDL: 64 mg/dL (ref >39)
LDL Chol Calc (NIH): 76 mg/dL (ref 0–99)
Triglycerides: 94 mg/dL (ref 0–149)
VLDL Cholesterol Cal: 17 mg/dL (ref 5–40)

## 2019-09-29 ENCOUNTER — Telehealth: Payer: Self-pay | Admitting: Adult Health

## 2019-09-29 NOTE — Telephone Encounter (Signed)
Frann Rider, NP  Valerie Salts, CMA Please advise patient that her recent lipid panel showed increase of LDL at 76 with prior level 59. Would recommend either daily use of lower dosage of atorvastatin or at least taking her current dosage at least 3 times weekly. She can follow-up with [place] with her PCP as well as repeat lipid panel around 6 months   Left message for patient to call back.

## 2019-10-25 ENCOUNTER — Ambulatory Visit (INDEPENDENT_AMBULATORY_CARE_PROVIDER_SITE_OTHER): Payer: Medicare Other

## 2019-10-25 DIAGNOSIS — I639 Cerebral infarction, unspecified: Secondary | ICD-10-CM | POA: Diagnosis not present

## 2019-10-25 LAB — CUP PACEART REMOTE DEVICE CHECK
Date Time Interrogation Session: 20211004230141
Implantable Pulse Generator Implant Date: 20210318

## 2019-10-28 NOTE — Progress Notes (Signed)
Carelink Summary Report / Loop Recorder 

## 2019-11-18 ENCOUNTER — Telehealth: Payer: Self-pay | Admitting: *Deleted

## 2019-11-18 NOTE — Telephone Encounter (Signed)
Patient called the office to schedule a follow up appt next year with Dr Denman George. Explained that the schedule is not ready and to call back the end of November

## 2019-11-27 LAB — CUP PACEART REMOTE DEVICE CHECK
Date Time Interrogation Session: 20211106230147
Implantable Pulse Generator Implant Date: 20210318

## 2019-11-28 ENCOUNTER — Ambulatory Visit (INDEPENDENT_AMBULATORY_CARE_PROVIDER_SITE_OTHER): Payer: Medicare Other

## 2019-11-28 DIAGNOSIS — I639 Cerebral infarction, unspecified: Secondary | ICD-10-CM | POA: Diagnosis not present

## 2019-11-29 NOTE — Progress Notes (Signed)
Carelink Summary Report / Loop Recorder 

## 2019-12-27 ENCOUNTER — Telehealth: Payer: Self-pay | Admitting: *Deleted

## 2019-12-27 NOTE — Telephone Encounter (Signed)
Called and left the patient a message to call the office back. Patient needs to be scheduled for a follow up appt with Dr Denman George

## 2019-12-28 NOTE — Telephone Encounter (Signed)
Patient called back and scheduled a follow up appt  °

## 2020-01-01 LAB — CUP PACEART REMOTE DEVICE CHECK
Date Time Interrogation Session: 20211210003133
Implantable Pulse Generator Implant Date: 20210318

## 2020-01-02 ENCOUNTER — Ambulatory Visit (INDEPENDENT_AMBULATORY_CARE_PROVIDER_SITE_OTHER): Payer: Medicare Other

## 2020-01-02 DIAGNOSIS — I639 Cerebral infarction, unspecified: Secondary | ICD-10-CM

## 2020-01-17 NOTE — Progress Notes (Signed)
Carelink Summary Report / Loop Recorder 

## 2020-02-06 ENCOUNTER — Ambulatory Visit (INDEPENDENT_AMBULATORY_CARE_PROVIDER_SITE_OTHER): Payer: Medicare Other

## 2020-02-06 DIAGNOSIS — I639 Cerebral infarction, unspecified: Secondary | ICD-10-CM

## 2020-02-08 LAB — CUP PACEART REMOTE DEVICE CHECK
Date Time Interrogation Session: 20220111230158
Implantable Pulse Generator Implant Date: 20210318

## 2020-02-10 ENCOUNTER — Encounter: Payer: Self-pay | Admitting: Gynecologic Oncology

## 2020-02-13 ENCOUNTER — Telehealth: Payer: Self-pay | Admitting: *Deleted

## 2020-02-13 NOTE — Telephone Encounter (Signed)
Patient called and cancelled her appt for tomorrow. Patient stated she will call back to reschedule. Patient cancelled due her husband having a heart attack.

## 2020-02-14 ENCOUNTER — Inpatient Hospital Stay: Payer: Medicare Other | Admitting: Gynecologic Oncology

## 2020-02-21 NOTE — Progress Notes (Signed)
Carelink Summary Report / Loop Recorder 

## 2020-03-07 LAB — CUP PACEART REMOTE DEVICE CHECK
Date Time Interrogation Session: 20220213230242
Implantable Pulse Generator Implant Date: 20210318

## 2020-03-12 ENCOUNTER — Ambulatory Visit (INDEPENDENT_AMBULATORY_CARE_PROVIDER_SITE_OTHER): Payer: Medicare Other

## 2020-03-12 DIAGNOSIS — I63413 Cerebral infarction due to embolism of bilateral middle cerebral arteries: Secondary | ICD-10-CM

## 2020-03-16 NOTE — Progress Notes (Signed)
Carelink Summary Report / Loop Recorder 

## 2020-03-19 ENCOUNTER — Telehealth: Payer: Self-pay | Admitting: *Deleted

## 2020-03-19 NOTE — Telephone Encounter (Signed)
Patient called and scheduled a follow up appt for May

## 2020-04-15 LAB — CUP PACEART REMOTE DEVICE CHECK
Date Time Interrogation Session: 20220318230524
Implantable Pulse Generator Implant Date: 20210318

## 2020-04-16 ENCOUNTER — Ambulatory Visit (INDEPENDENT_AMBULATORY_CARE_PROVIDER_SITE_OTHER): Payer: Medicare Other

## 2020-04-16 DIAGNOSIS — I639 Cerebral infarction, unspecified: Secondary | ICD-10-CM

## 2020-04-30 NOTE — Progress Notes (Signed)
Carelink Summary Report / Loop Recorder 

## 2020-05-21 ENCOUNTER — Ambulatory Visit (INDEPENDENT_AMBULATORY_CARE_PROVIDER_SITE_OTHER): Payer: Medicare Other

## 2020-05-21 DIAGNOSIS — I639 Cerebral infarction, unspecified: Secondary | ICD-10-CM

## 2020-05-22 LAB — CUP PACEART REMOTE DEVICE CHECK
Date Time Interrogation Session: 20220430230305
Implantable Pulse Generator Implant Date: 20210318

## 2020-06-11 ENCOUNTER — Other Ambulatory Visit: Payer: Self-pay

## 2020-06-11 ENCOUNTER — Inpatient Hospital Stay: Payer: Medicare Other | Attending: Gynecologic Oncology | Admitting: Gynecologic Oncology

## 2020-06-11 ENCOUNTER — Encounter: Payer: Self-pay | Admitting: Gynecologic Oncology

## 2020-06-11 VITALS — BP 143/85 | HR 83 | Temp 97.0°F | Resp 18 | Wt 169.0 lb

## 2020-06-11 DIAGNOSIS — Z90710 Acquired absence of both cervix and uterus: Secondary | ICD-10-CM | POA: Diagnosis not present

## 2020-06-11 DIAGNOSIS — C541 Malignant neoplasm of endometrium: Secondary | ICD-10-CM

## 2020-06-11 DIAGNOSIS — Z8542 Personal history of malignant neoplasm of other parts of uterus: Secondary | ICD-10-CM | POA: Diagnosis present

## 2020-06-11 DIAGNOSIS — N3941 Urge incontinence: Secondary | ICD-10-CM | POA: Insufficient documentation

## 2020-06-11 DIAGNOSIS — Z79899 Other long term (current) drug therapy: Secondary | ICD-10-CM | POA: Diagnosis not present

## 2020-06-11 DIAGNOSIS — Z90722 Acquired absence of ovaries, bilateral: Secondary | ICD-10-CM | POA: Insufficient documentation

## 2020-06-11 NOTE — Patient Instructions (Signed)
Please notify Dr Denman George at phone number [phone] if you notice vaginal bleeding, new pelvic or abdominal pains, bloating, feeling full easy, or a change in bladder or bowel function.   Please return to see Dr Denman George in 6 months after which you can transition to annual exams.

## 2020-06-11 NOTE — Progress Notes (Signed)
Return Patient Note: Gyn-Onc  Lisa [name] 75 y.o. female  CC:  Chief Complaint  Patient presents with  . endometrial cancer    Assessment/Plan:  [name]  is a 75 y.o.  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 MSI stable.  She is NED on today's exam.  She will return in 6 months after which she will transition to annual visits if she remains disease free.  HPI: [name] is a 75 year old parous woman who was diagnosed with a grade 2 endometrioid adenocarcinoma her in January 2018.  At that time she was living in [facility].  On March 10, 2016 Dr. Ronal Fear performed a laparoscopy converted to laparotomy, modified radical abdominal hysterectomy, BSO, retroperitoneal pelvic lymphadenectomy.  Periaortic sampling also took place.  Final pathology revealed a 2 cm grade 2 endometrioid endometrial adenocarcinoma with 70% myometrial invasion present.  There is not a comment in the pathology report of lymphovascular space invasion.  11 lymph nodes were sampled including 4 from the left pelvic 7 from the right pelvic 1 from the left aortic and 2 from the right aortic.  All were negative for apparent metastases.  The ovaries tubes and cervix were free of disease.  She was determined to have high intermediate risk factors based on her uterine pathology and was recommended adjuvant vaginal brachytherapy.  The patient states that she declined this because she was told by her surgeon that he was "sure I got it all out".  She did however receive [employer] chemo resistance assay testing which revealed that her tumor would be sensitive to carboplatin paclitaxel in addition to multiple other agents.  Genomic analysis revealed intact nuclear expression of mL H1 [place] 2 and [place] 6 and PMS 2 reflecting no loss of expression of mismatch  repair proteins and a microsatellite stable tumor.  She relocated from [place] to [place].   She was seen by [facility] and treated for urinary urge incontinence with Mybetric which has significantly improved symptoms.  Interval Hx:  Her husband had a massive heart attach and is still recovering  She has no symptoms concerning for recurrence.   She has no questions or concerns.   Current Meds:  Outpatient Encounter Medications as of 06/11/2020  Medication Sig  . aspirin EC 81 MG EC tablet Take 1 tablet (81 mg total) by mouth daily.  Marland Kitchen atorvastatin (LIPITOR) 40 MG tablet Take 1 tablet (40 mg total) by mouth daily at 6 PM.  . betamethasone dipropionate (DIPROLENE) 0.05 % ointment Apply 1 application topically daily.  . Cholecalciferol (VITAMIN D3 PO) Take 25 mg by mouth daily.  . famotidine (PEPCID) 20 MG tablet Take 20 mg by mouth daily.  . fexofenadine (ALLEGRA) 180 MG tablet Take 180 mg by mouth daily.  . Magnesium Oxide 400 MG CAPS Take 1 capsule by mouth daily.  . Multiple Vitamins-Minerals (CENTRUM SILVER 50+WOMEN PO) Take 1 tablet by mouth daily.  . SUPER B COMPLEX/C PO Take 1 tablet by mouth daily.   No facility-administered encounter medications on file as of 06/11/2020.    Allergy:  Allergies  Allergen Reactions  . Codeine Nausea And Vomiting  . Tylenol [Acetaminophen] Hives    Social Hx:   Social History   Socioeconomic History  . Marital status: Married    Spouse name: Not on file  . Number of children: Not on file  .  Years of education: Not on file  . Highest education level: Not on file  Occupational History  . Not on file  Tobacco Use  . Smoking status: Never Smoker  . Smokeless tobacco: Never Used  Vaping Use  . Vaping Use: Never used  Substance and Sexual Activity  . Alcohol use: Yes    Comment: occasional drinker  . Drug use: Never  . Sexual activity: Not on file  Other Topics Concern  . Not on file  Social History Narrative  .  Not on file   Social Determinants of Health   Financial Resource Strain: Not on file  Food Insecurity: Not on file  Transportation Needs: Not on file  Physical Activity: Not on file  Stress: Not on file  Social Connections: Not on file  Intimate Partner Violence: Not on file    Past Surgical Hx:  Past Surgical History:  Procedure Laterality Date  . BREAST EXCISIONAL BIOPSY Left 1975  . LOOP RECORDER INSERTION N/A 04/07/2019   Procedure: LOOP RECORDER INSERTION;  Surgeon: Evans Lance, MD;  Location: [facility];  Service: Cardiovascular;  Laterality: N/A;  . TUBAL LIGATION  1970    Past Medical Hx:  Past Medical History:  Diagnosis Date  . Stroke ([place]) 04/06/2019  . Uterine cancer ([place]) 01/25/2016    Past Gynecological History:  Endometrial cancer (see above) No LMP recorded. Patient is postmenopausal.  Family Hx:  Family History  Problem Relation Age of Onset  . Hypertension Mother   . Stroke Mother   . Hypertension Father     Review of Systems:  Constitutional  Feels well,    ENT Normal appearing ears and nares bilaterally Skin/Breast  No rash, sores, jaundice, itching, dryness Cardiovascular  No chest pain, shortness of breath, or edema  Pulmonary  No cough or wheeze.  Gastro Intestinal  No nausea, vomitting, or diarrhoea. No bright red blood per rectum, no abdominal pain, change in bowel movement, or constipation.  Genito Urinary  No frequency, urgency, dysuria, no bleeding + urinary incontinence - much better on Mybetric.  Musculo Skeletal  No myalgia, arthralgia, joint swelling or pain  Neurologic  No weakness, numbness, change in gait,  Psychology  No depression, anxiety, insomnia.   Vitals:  Blood pressure (!) 143/85, pulse 83, temperature (!) 97 F (36.1 C), temperature source Tympanic, resp. rate 18, weight 169 lb (76.7 kg), SpO2 99 %.  Physical Exam: WD in NAD Neck  Supple NROM, without any enlargements.  Lymph Node Survey No  cervical supraclavicular or inguinal adenopathy Cardiovascular  Pulse normal rate, regularity and rhythm. S1 and S2 normal.  Lungs  Clear to auscultation bilateraly, without wheezes/crackles/rhonchi. Good air movement.  Skin  No rash/lesions/breakdown  Psychiatry  Alert and oriented to person, place, and time  Abdomen  Normoactive bowel sounds, abdomen soft, non-tender and nonobese without evidence of hernia. Back No CVA tenderness Genito Urinary  Vulva/vagina: Normal external female genitalia.  No lesions. No discharge or bleeding.  Bladder/urethra:  No lesions or masses, well supported bladder  Vagina: normal, free of lesions,   Cervix and uterus : surgically absent Rectal  deferred Extremities  No bilateral cyanosis, clubbing or edema.   Thereasa Solo, MD  06/11/2020, 1:59 PM

## 2020-06-12 NOTE — Progress Notes (Signed)
Carelink Summary Report / Loop Recorder 

## 2020-06-24 LAB — CUP PACEART REMOTE DEVICE CHECK
Date Time Interrogation Session: 20220602230719
Implantable Pulse Generator Implant Date: 20210318

## 2020-06-25 ENCOUNTER — Ambulatory Visit (INDEPENDENT_AMBULATORY_CARE_PROVIDER_SITE_OTHER): Payer: Medicare Other

## 2020-06-25 DIAGNOSIS — I639 Cerebral infarction, unspecified: Secondary | ICD-10-CM | POA: Diagnosis not present

## 2020-07-17 NOTE — Progress Notes (Signed)
Carelink Summary Report / Loop Recorder 

## 2020-07-30 ENCOUNTER — Ambulatory Visit (INDEPENDENT_AMBULATORY_CARE_PROVIDER_SITE_OTHER): Payer: Medicare Other

## 2020-07-30 DIAGNOSIS — I639 Cerebral infarction, unspecified: Secondary | ICD-10-CM

## 2020-07-30 LAB — CUP PACEART REMOTE DEVICE CHECK
Date Time Interrogation Session: 20220705230642
Implantable Pulse Generator Implant Date: 20210318

## 2020-08-02 ENCOUNTER — Other Ambulatory Visit: Payer: Self-pay

## 2020-08-02 ENCOUNTER — Emergency Department (HOSPITAL_BASED_OUTPATIENT_CLINIC_OR_DEPARTMENT_OTHER): Payer: Medicare Other

## 2020-08-02 ENCOUNTER — Inpatient Hospital Stay (HOSPITAL_BASED_OUTPATIENT_CLINIC_OR_DEPARTMENT_OTHER)
Admission: EM | Admit: 2020-08-02 | Discharge: 2020-08-04 | DRG: 065 | Disposition: A | Discharge status: Home / Self Care | Payer: Medicare Other | Attending: Internal Medicine | Admitting: Internal Medicine

## 2020-08-02 DIAGNOSIS — Z823 Family history of stroke: Secondary | ICD-10-CM

## 2020-08-02 DIAGNOSIS — R29701 NIHSS score 1: Secondary | ICD-10-CM | POA: Diagnosis present

## 2020-08-02 DIAGNOSIS — I080 Rheumatic disorders of both mitral and aortic valves: Secondary | ICD-10-CM | POA: Diagnosis present

## 2020-08-02 DIAGNOSIS — I63422 Cerebral infarction due to embolism of left anterior cerebral artery: Secondary | ICD-10-CM | POA: Diagnosis present

## 2020-08-02 DIAGNOSIS — R4182 Altered mental status, unspecified: Secondary | ICD-10-CM

## 2020-08-02 DIAGNOSIS — I639 Cerebral infarction, unspecified: Secondary | ICD-10-CM

## 2020-08-02 DIAGNOSIS — E785 Hyperlipidemia, unspecified: Secondary | ICD-10-CM

## 2020-08-02 DIAGNOSIS — G454 Transient global amnesia: Secondary | ICD-10-CM | POA: Diagnosis present

## 2020-08-02 DIAGNOSIS — Z79899 Other long term (current) drug therapy: Secondary | ICD-10-CM | POA: Diagnosis not present

## 2020-08-02 DIAGNOSIS — Z20822 Contact with and (suspected) exposure to covid-19: Secondary | ICD-10-CM | POA: Diagnosis present

## 2020-08-02 DIAGNOSIS — E669 Obesity, unspecified: Secondary | ICD-10-CM | POA: Diagnosis present

## 2020-08-02 DIAGNOSIS — G8194 Hemiplegia, unspecified affecting left nondominant side: Secondary | ICD-10-CM | POA: Diagnosis present

## 2020-08-02 DIAGNOSIS — I10 Essential (primary) hypertension: Secondary | ICD-10-CM | POA: Diagnosis present

## 2020-08-02 DIAGNOSIS — G459 Transient cerebral ischemic attack, unspecified: Secondary | ICD-10-CM

## 2020-08-02 DIAGNOSIS — R413 Other amnesia: Secondary | ICD-10-CM | POA: Diagnosis present

## 2020-08-02 DIAGNOSIS — Z7982 Long term (current) use of aspirin: Secondary | ICD-10-CM | POA: Diagnosis not present

## 2020-08-02 DIAGNOSIS — Z885 Allergy status to narcotic agent status: Secondary | ICD-10-CM

## 2020-08-02 DIAGNOSIS — Z8249 Family history of ischemic heart disease and other diseases of the circulatory system: Secondary | ICD-10-CM | POA: Diagnosis not present

## 2020-08-02 DIAGNOSIS — Z6834 Body mass index (BMI) 34.0-34.9, adult: Secondary | ICD-10-CM | POA: Diagnosis not present

## 2020-08-02 DIAGNOSIS — R4701 Aphasia: Secondary | ICD-10-CM | POA: Diagnosis present

## 2020-08-02 DIAGNOSIS — I6389 Other cerebral infarction: Secondary | ICD-10-CM | POA: Diagnosis not present

## 2020-08-02 DIAGNOSIS — Z8673 Personal history of transient ischemic attack (TIA), and cerebral infarction without residual deficits: Secondary | ICD-10-CM

## 2020-08-02 DIAGNOSIS — Z8542 Personal history of malignant neoplasm of other parts of uterus: Secondary | ICD-10-CM | POA: Diagnosis not present

## 2020-08-02 LAB — URINALYSIS, ROUTINE W REFLEX MICROSCOPIC
Bilirubin Urine: NEGATIVE
Glucose, UA: NEGATIVE mg/dL
Hgb urine dipstick: NEGATIVE
Ketones, ur: NEGATIVE mg/dL
Nitrite: NEGATIVE
Protein, ur: NEGATIVE mg/dL
Specific Gravity, Urine: 1.01 (ref 1.005–1.030)
pH: 6.5 (ref 5.0–8.0)

## 2020-08-02 LAB — CBC WITH DIFFERENTIAL/PLATELET
Abs Immature Granulocytes: 0.03 10*3/uL (ref 0.00–0.07)
Basophils Absolute: 0.1 10*3/uL (ref 0.0–0.1)
Basophils Relative: 1 %
Eosinophils Absolute: 0.2 10*3/uL (ref 0.0–0.5)
Eosinophils Relative: 2 %
HCT: 42.8 % (ref 36.0–46.0)
Hemoglobin: 14.4 g/dL (ref 12.0–15.0)
Immature Granulocytes: 0 %
Lymphocytes Relative: 11 %
Lymphs Abs: 1.1 10*3/uL (ref 0.7–4.0)
MCH: 32.6 pg (ref 26.0–34.0)
MCHC: 33.6 g/dL (ref 30.0–36.0)
MCV: 96.8 fL (ref 80.0–100.0)
Monocytes Absolute: 0.5 10*3/uL (ref 0.1–1.0)
Monocytes Relative: 6 %
Neutro Abs: 8 10*3/uL — ABNORMAL HIGH (ref 1.7–7.7)
Neutrophils Relative %: 80 %
Platelets: 261 10*3/uL (ref 150–400)
RBC: 4.42 MIL/uL (ref 3.87–5.11)
RDW: 12.6 % (ref 11.5–15.5)
WBC: 9.9 10*3/uL (ref 4.0–10.5)
nRBC: 0 % (ref 0.0–0.2)

## 2020-08-02 LAB — URINALYSIS, MICROSCOPIC (REFLEX)

## 2020-08-02 LAB — COMPREHENSIVE METABOLIC PANEL
ALT: 23 U/L (ref 0–44)
AST: 24 U/L (ref 15–41)
Albumin: 4.2 g/dL (ref 3.5–5.0)
Alkaline Phosphatase: 89 U/L (ref 38–126)
Anion gap: 9 (ref 5–15)
BUN: 26 mg/dL — ABNORMAL HIGH (ref 8–23)
CO2: 29 mmol/L (ref 22–32)
Calcium: 9.6 mg/dL (ref 8.9–10.3)
Chloride: 102 mmol/L (ref 98–111)
Creatinine, Ser: 0.97 mg/dL (ref 0.44–1.00)
GFR, Estimated: >60 mL/min (ref >60)
Glucose, Bld: 115 mg/dL — ABNORMAL HIGH (ref 70–99)
Potassium: 4.1 mmol/L (ref 3.5–5.1)
Sodium: 140 mmol/L (ref 135–145)
Total Bilirubin: 1 mg/dL (ref 0.3–1.2)
Total Protein: 6.7 g/dL (ref 6.5–8.1)

## 2020-08-02 LAB — RESP PANEL BY RT-PCR (FLU A&B, COVID) ARPGX2
Influenza A by PCR: NEGATIVE
Influenza B by PCR: NEGATIVE
SARS Coronavirus 2 by RT PCR: NEGATIVE

## 2020-08-02 MED ORDER — ATORVASTATIN CALCIUM 40 MG PO TABS
40.0000 mg | ORAL_TABLET | Freq: Every day | ORAL | Status: DC
  Administered 2020-08-03: 40 mg via ORAL
  Filled 2020-08-02: qty 1

## 2020-08-02 MED ORDER — VITAMIN B-12 100 MCG PO TABS
100.0000 ug | ORAL_TABLET | Freq: Every day | ORAL | Status: DC
  Administered 2020-08-02 – 2020-08-04 (×3): 100 ug via ORAL
  Filled 2020-08-02 (×3): qty 1

## 2020-08-02 MED ORDER — LORATADINE 10 MG PO TABS
10.0000 mg | ORAL_TABLET | Freq: Every day | ORAL | Status: DC
  Administered 2020-08-02 – 2020-08-04 (×3): 10 mg via ORAL
  Filled 2020-08-02 (×3): qty 1

## 2020-08-02 MED ORDER — MAGNESIUM OXIDE -MG SUPPLEMENT 400 (240 MG) MG PO TABS
400.0000 mg | ORAL_TABLET | Freq: Every day | ORAL | Status: DC
  Administered 2020-08-02 – 2020-08-04 (×3): 400 mg via ORAL
  Filled 2020-08-02 (×6): qty 1

## 2020-08-02 MED ORDER — STROKE: EARLY STAGES OF RECOVERY BOOK
Freq: Once | Status: AC
  Filled 2020-08-02: qty 1

## 2020-08-02 MED ORDER — ENOXAPARIN SODIUM 40 MG/0.4ML IJ SOSY
40.0000 mg | PREFILLED_SYRINGE | INTRAMUSCULAR | Status: DC
  Administered 2020-08-02 – 2020-08-03 (×2): 40 mg via SUBCUTANEOUS
  Filled 2020-08-02 (×2): qty 0.4

## 2020-08-02 MED ORDER — ASPIRIN EC 81 MG PO TBEC
81.0000 mg | DELAYED_RELEASE_TABLET | Freq: Every day | ORAL | Status: DC
  Administered 2020-08-03 – 2020-08-04 (×2): 81 mg via ORAL
  Filled 2020-08-02 (×2): qty 1

## 2020-08-02 NOTE — Consult Note (Signed)
NEUROLOGY CONSULTATION NOTE   Date of service: August 02, 2020 Patient Name: Lisa [name] MRN:  [id number] DOB:  [birth year] Reason for consult: "Episodes of confusion vs speech distubrance concerning for stroke vs seizures. Requesting Provider: Lequita Halt, MD _ _ _   _ __   _ __ _ _  __ __   _ __   __ _  History of Present Illness  Lisa [name] is a 75 y.o. 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  She woke up this morning and was hainv breakfast with her husband and the husband noted that she was not communicative. This is not her. He asked her questions and she would only respond with No or a short phrase. She left for her bedroom and her husband did not hear anything so thought she went to bed. He saw her getting ready to drive down to get her bed. A few minutes later, he went outside and found her in the car and she did not know how to start the car. Husband got in the car and drove down to the friend's house and ultimately they decided that she should be evaluated in the ED and they took her to [facility] and she was brought in to [facility] for further workup. The entire episodes lasted between 1 - 1.5 hours before resolution.  Never had anything like this in the past, endorses history of strokes in the past, no prior history of seizures.  Does not drink alcohol.  No family history of seizures.  No prior CNS infections or surgeries.  No prior events of hypoglycemia and she is not a diabetic.  No recent medication changes.  She does not take any narcotics or any sedatives at home.  She denies any signs or symptoms of upper respiratory infection, urinary tract infection or gastroenteritis over the last couple weeks.  No signs or symptoms of meningitis.  During my evaluation, she did seem to get stuck on  some words during conversation and appeared to be a little frustrated and having some word finding difficulties.  ROS   Constitutional Denies weight loss, fever and chills.   HEENT Denies changes in vision and hearing.   Respiratory Denies SOB and cough.   CV Denies palpitations and CP   GI Denies abdominal pain, nausea, vomiting and diarrhea.   GU Denies dysuria and urinary frequency.   MSK Denies myalgia and joint pain.   Skin Denies rash and pruritus.   Neurological Denies headache and syncope.   Psychiatric Denies recent changes in mood. Denies anxiety and depression.    Past History   Past Medical History:  Diagnosis Date   Stroke ([place]) 04/06/2019   Uterine cancer ([place]) 01/25/2016   Past Surgical History:  Procedure Laterality Date   BREAST EXCISIONAL BIOPSY Left 1975   LOOP RECORDER INSERTION N/A 04/07/2019   Procedure: LOOP RECORDER INSERTION;  Surgeon: Evans Lance, MD;  Location: [facility];  Service: Cardiovascular;  Laterality: N/A;   TUBAL LIGATION  1970   Family History  Problem Relation Age of Onset   Hypertension Mother    Stroke Mother    Hypertension Father    Social History   Socioeconomic History   Marital status: Married    Spouse name: Not on file   Number of children:  Not on file   Years of education: Not on file   Highest education level: Not on file  Occupational History   Not on file  Tobacco Use   Smoking status: Never   Smokeless tobacco: Never  Vaping Use   Vaping Use: Never used  Substance and Sexual Activity   Alcohol use: Yes    Comment: occasional drinker   Drug use: Never   Sexual activity: Not on file  Other Topics Concern   Not on file  Social History Narrative   Not on file   Social Determinants of Health   Financial Resource Strain: Not on file  Food Insecurity: Not on file  Transportation Needs: Not on file  Physical Activity: Not on file  Stress: Not on file  Social Connections: Not on file    Allergies  Allergen Reactions   Codeine Nausea And Vomiting   Tylenol [Acetaminophen] Hives    Medications   Medications Prior to Admission  Medication Sig Dispense Refill Last Dose   aspirin EC 81 MG EC tablet Take 1 tablet (81 mg total) by mouth daily.   08/02/2020   atorvastatin (LIPITOR) 40 MG tablet Take 1 tablet (40 mg total) by mouth daily at 6 PM. 90 tablet 1 08/02/2020   Cholecalciferol (VITAMIN D3 PO) Take 25 mg by mouth daily.   08/01/2020   fexofenadine (ALLEGRA) 180 MG tablet Take 180 mg by mouth daily.   08/01/2020   Magnesium Oxide 400 MG CAPS Take 1 capsule by mouth daily.   08/01/2020   Multiple Vitamins-Minerals (CENTRUM SILVER 50+WOMEN PO) Take 1 tablet by mouth daily.   08/01/2020   SUPER B COMPLEX/C PO Take 1 tablet by mouth daily.   08/01/2020   vitamin B-12 (CYANOCOBALAMIN) 100 MCG tablet Take 100 mcg by mouth daily.   08/01/2020     Vitals   Vitals:   08/02/20 1259 08/02/20 1456 08/02/20 1623 08/02/20 1739  BP: (!) 141/76 125/75 (!) 124/58 (!) 144/56  Pulse: 85 85 97 92  Resp: 15 16 15 16   Temp:   98.6 F (37 C) 98.3 F (36.8 C)  TempSrc:    Oral  SpO2: 100% 99% 100% 98%  Weight:      Height:         Body mass index is 34.01 kg/m.  Physical Exam   General: Laying comfortably in bed; in no acute distress.  HENT: Normal oropharynx and mucosa. Normal external appearance of ears and nose.  Neck: Supple, no pain or tenderness  CV: No JVD. No peripheral edema.  Pulmonary: Symmetric Chest rise. Normal respiratory effort.  Abdomen: Soft to touch, non-tender.  Ext: No cyanosis, edema, or deformity  Skin: No rash. Normal palpation of skin.   Musculoskeletal: Normal digits and nails by inspection. No clubbing.   Neurologic Examination  Mental status/Cognition: Alert, oriented to self, place, month and year, good attention.  Speech/language: Fluent mostly but does seem to get stuck on some words intermittently, comprehension intact, object naming intact,  repetition intact.  Cranial nerves:   CN II Pupils equal and reactive to light, no VF deficits    CN III,IV,VI EOM intact, no gaze preference or deviation, no nystagmus    CN V normal sensation in V1, V2, and V3 segments bilaterally    CN VII no asymmetry, no nasolabial fold flattening    CN VIII normal hearing to speech    CN IX & X normal palatal elevation, no uvular deviation    CN XI 5/5  head turn and 5/5 shoulder shrug bilaterally    CN XII midline tongue protrusion    Motor:  Muscle bulk: normal, tone normal, pronator drift none tremor none Mvmt Root Nerve  Muscle Right Left Comments  SA C5/6 Ax Deltoid 5 5   EF C5/6 Mc Biceps 5 5   EE C6/7/8 Rad Triceps 5 5   WF C6/7 Med FCR     WE C7/8 PIN ECU     F Ab C8/T1 U ADM/FDI 5 5   HF L1/2/3 Fem Illopsoas 5 5   KE L2/3/4 Fem Quad 5 5   DF L4/5 D Peron Tib Ant 5 5   PF S1/2 Tibial Grc/Sol 5 5    Reflexes:  Right Left Comments  Pectoralis      Biceps (C5/6) 2+ 2+   Brachioradialis (C5/6) 2+ 2+    Triceps (C6/7) 2+ 2+    Patellar (L3/4) 3 3    Achilles (S1) 3 3    Hoffman + +    Plantar withdraws withdraws   Jaw jerk    Sensation:  Light touch intact   Pin prick    Temperature    Vibration   Proprioception    Coordination/Complex Motor:  - Finger to Nose intact BL - Heel to shin intact BL - Rapid alternating movement are slowed BL - Gait: Deferred.  Labs   CBC:  Recent Labs  Lab 08/02/20 1038  WBC 9.9  NEUTROABS 8.0*  HGB 14.4  HCT 42.8  MCV 96.8  PLT 962    Basic Metabolic Panel:  Lab Results  Component Value Date   NA 140 08/02/2020   K 4.1 08/02/2020   CO2 29 08/02/2020   GLUCOSE 115 (H) 08/02/2020   BUN 26 (H) 08/02/2020   CREATININE 0.97 08/02/2020   CALCIUM 9.6 08/02/2020   GFRNONAA >60 08/02/2020   GFRAA >60 04/07/2019   Lipid Panel:  Lab Results  Component Value Date   LDLCALC 76 09/27/2019   HgbA1c:  Lab Results  Component Value Date   HGBA1C 5.7 (H) 04/06/2019   Urine Drug  Screen: No results found for: LABOPIA, COCAINSCRNUR, LABBENZ, AMPHETMU, THCU, LABBARB  Alcohol Level No results found for: [place]  CT Head without contrast: CTH was negative for a large hypodensity concerning for a large territory infarct or hyperdensity concerning for an ICH   MR Angio head without contrast and Carotid Duplex BL: pending  MRI Brain: pending  rEEG:  pending  Impression   Lisa [name] is a 75 y.o. 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 The episode lasted 1-1.5 hours and then resolved. She does seem to be having very mild word finding difficulty on exam thou that is only noticeable when she is trying to have a fluent conversation. She would get stuck on a word here and there. No other focal deficit noted. No receptive aphasia noted.  My suspicion for a seizure is low. I suspect that this was likely a TIA vs minor ischemic stroke. I do think that given that this was very subtle on evaluation, we should still get a routine EEG.  Primary Diagnosis:  Cerebral infarction, unspecified.  Secondary Diagnosis:   Recommendations   Plan:  - Recommend brain imaging with MRI Brain without contrast - Recommend Vascular imaging with MRA Angio Head  without contrast and US Carotid doppler - Recommend obtaining TTE - Recommend obtaining Lipid panel with LDL - Please start statin if LDL > 70 - Recommend HbA1c - Antithrombotic - aspirin 81mg  daily. - Recommend DVT ppx - SBP goal - permissive hypertension first 24 h < 220/110. Held home meds.  - Recommend Telemetry monitoring for arrythmia - Recommend bedside swallow screen prior to PO intake. - Stroke education booklet - Recommend PT/OT/SLP consult - Interrogation of loop recorder - routine  EEG.  ______________________________________________________________________   Thank you for the opportunity to take part in the care of this patient. If you have any further questions, please contact the neurology consultation attending.  Signed,  [place] Pager Number [phone] _ _ _   _ __   _ __ _ _  __ __   _ __   __ _

## 2020-08-02 NOTE — ED Provider Notes (Signed)
[facility] EMERGENCY DEPARTMENT Provider Note   CSN: [id number] Arrival date & time: 08/02/20  1011     History Chief Complaint  Patient presents with   Memory Loss    Lisa [name] is a 75 y.o. female.  Patient with history of stroke.  Husband states that patient after waking up normally had a period of time where she was confused unable to respond appropriately to questions.  Patient woke up in her normal state of health made coffee then went to the bedroom and husband states that he did not hear from her for a few minutes and went to go talk with her and found her standing in the room confused.  He was asking her questions and she would not respond.  She did not have any weakness or numbness or facial droop at this time.  She continued to act confused but was well enough to go get her car keys to go to an event that she had this morning.  Husband noticed that she had not started the car after several minutes and went to go check on her.  Husband states that he found her in the car looking confused.  When he asked her what she was trying to do she said that she did not know how to start the car.  She appears more at her baseline now.  She does have memory of these events.  She denies any stress or anxiety.  She has no weakness or numbness or vision changes.  She is answering questions appropriately.  The history is provided by the patient and a caregiver.  Neurologic Problem This is a new problem. The current episode started 1 to 2 hours ago. The problem has been resolved. Pertinent negatives include no chest pain, no abdominal pain, no headaches and no shortness of breath. Nothing aggravates the symptoms. Nothing relieves the symptoms. She has tried nothing for the symptoms. The treatment provided no relief.      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     Family History  Problem Relation Age of Onset   Hypertension Mother    Stroke Mother    Hypertension Father     Social History   Tobacco Use   Smoking status: Never   Smokeless tobacco: Never  Vaping Use   Vaping Use: Never used  Substance Use Topics   Alcohol use: Yes    Comment: occasional drinker   Drug use: Never    Home Medications Prior to Admission medications   Medication Sig Start Date End Date Taking? Authorizing Provider  aspirin EC 81 MG EC tablet Take 1 tablet (81 mg total) by mouth daily. 04/08/19   Black, Lezlie Octave, NP  atorvastatin (LIPITOR) 40 MG tablet Take  1 tablet (40 mg total) by mouth daily at 6 PM. 05/11/19   Frann Rider, NP  betamethasone dipropionate (DIPROLENE) 0.05 % ointment Apply 1 application topically daily. 01/04/19   [provider]  Cholecalciferol (VITAMIN D3 PO) Take 25 mg by mouth daily.    [provider]  famotidine (PEPCID) 20 MG tablet Take 20 mg by mouth daily. 09/10/18   [provider]  fexofenadine (ALLEGRA) 180 MG tablet Take 180 mg by mouth daily.    [provider]  Magnesium Oxide 400 MG CAPS Take 1 capsule by mouth daily.    [provider]  Multiple Vitamins-Minerals (CENTRUM SILVER 50+WOMEN PO) Take 1 tablet by mouth daily.    [provider]  SUPER B COMPLEX/C PO Take 1 tablet  by mouth daily.    [provider]    Allergies    Codeine and Tylenol [acetaminophen]  Review of Systems   Review of Systems  Constitutional:  Negative for chills and fever.  HENT:  Negative for ear pain and sore throat.   Eyes:  Negative for pain and visual disturbance.  Respiratory:  Negative for cough and shortness of breath.   Cardiovascular:  Negative for chest pain and palpitations.  Gastrointestinal:  Negative for abdominal pain and vomiting.  Genitourinary:  Negative for dysuria and hematuria.  Musculoskeletal:  Negative for arthralgias and back pain.  Skin:  Negative for color change and rash.  Neurological:  Negative for dizziness, tremors, seizures, syncope, facial asymmetry, speech difficulty, weakness, light-headedness, numbness and headaches.  Psychiatric/Behavioral:  Positive for confusion. The patient is not nervous/anxious.   All other systems reviewed and are negative.  Physical Exam Updated Vital Signs BP (!) 156/71   Pulse 84   Temp 98.8 F (37.1 C) (Oral)   Resp 16   Ht 5\' 1"  (1.549 m)   Wt 81.6 kg   SpO2 100%   BMI 34.01 kg/m   Physical Exam Vitals and nursing note reviewed.  Constitutional:      General: She is not in acute distress.    Appearance: She is well-developed. She is not ill-appearing.  HENT:     Head: Normocephalic and atraumatic.     Nose: Nose normal.     Mouth/Throat:     Mouth: Mucous membranes are moist.  Eyes:     Extraocular Movements: Extraocular movements intact.     Conjunctiva/sclera: Conjunctivae normal.     Pupils: Pupils are equal, round, and reactive to light.  Cardiovascular:     Rate and Rhythm: Normal rate and regular rhythm.     Heart sounds: No murmur heard. Pulmonary:     Effort: Pulmonary effort is normal. No respiratory distress.     Breath sounds: Normal breath sounds.  Abdominal:     Palpations: Abdomen is soft.     Tenderness: There is no abdominal tenderness.  Musculoskeletal:      Cervical back: Normal range of motion and neck supple.  Skin:    General: Skin is warm and dry.  Neurological:     General: No focal deficit present.     Mental Status: She is alert and oriented to person, place, and time.     Cranial Nerves: No cranial nerve deficit.     Sensory: No sensory deficit.     Motor: No weakness.     Coordination: Coordination normal.     Comments: 5+ out of 5 strength, normal sensation, no drift, normal speech, normal finger-nose-finger, is alert and oriented.  Is able to answer questions there is some hesitation sometimes with answering questions but no obvious aphasia or dysarthria or dysmetria    ED Results / Procedures / Treatments   Labs (all labs ordered are listed, but only abnormal results are displayed) Labs Reviewed  CBC WITH DIFFERENTIAL/PLATELET - Abnormal; Notable for the following components:      Result Value   Neutro Abs 8.0 (*)    All other components within normal limits  COMPREHENSIVE METABOLIC PANEL - Abnormal; Notable for the following components:   Glucose, Bld 115 (*)    BUN 26 (*)    All other components within normal limits  URINALYSIS, ROUTINE W REFLEX MICROSCOPIC    EKG EKG Interpretation  Date/Time:  Thursday August 02 2020 11:14:45 EDT Ventricular Rate:  81 PR Interval:  161 QRS Duration: 83 QT Interval:  431 QTC Calculation: 501 R Axis:   -38 Text Interpretation: Sinus rhythm Left axis deviation Confirmed by Ronnald Nian, Webber Michiels ([id number]) on 08/02/2020 11:18:33 AM  Radiology CT Head Wo Contrast  Result Date: 08/02/2020 CLINICAL DATA:  Altered mental status.  History of stroke. EXAM: CT HEAD WITHOUT CONTRAST TECHNIQUE: Contiguous axial images were obtained from the base of the skull through the vertex without intravenous contrast. COMPARISON:  April 05, 2019. FINDINGS: Brain: No evidence of acute large vascular territory infarction, acute hemorrhage, hydrocephalus, extra-axial collection or mass lesion/mass effect. Similar  appearance of multiple chronic infarcts, including remote infarcts in the left frontal lobe, right parietal lobe (perirolandic), left occipital lobe, left corona radiata and bilateral cerebellar hemispheres. Additional patchy white matter hypoattenuation most likely represents the sequela of chronic microvascular ischemic disease. Similar atrophy with ex vacuo ventricular dilation. Vascular: No hyperdense vessel.  Calcific atherosclerosis. Skull: No acute fracture. Sinuses/Orbits: Visualized sinuses are largely clear. Imaged orbits are unremarkable. Other: No mastoid effusions. IMPRESSION: 1. No evidence of acute large vascular territory infarct or acute hemorrhage. 2. 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 Electronically Signed   By: Margaretha Sheffield MD   On: 08/02/2020 11:18    Procedures Procedures   Medications Ordered in ED Medications - No data to display  ED Course  I have reviewed the triage vital signs and the nursing notes.  Pertinent labs & imaging results that were available during my care of the patient were reviewed by me and considered in my medical decision making (see chart for details).    MDM Rules/Calculators/A&P                          Syvanna Ciolino is a 75 year old female with history of stroke who presents to the ED with confusion.  Normal vitals except for mild high blood pressure.  Patient with episode of confusion this morning.  Woke up in her normal state of health.  Then shortly after waking up husband noticed that she was not answering his questions.  She seemed to be confused.  Did not have any weakness numbness or facial droop.  Basically was just given him a blank stare.  She is acting off.  He did not think much of it until things got slightly worse when she went to go drive to an event that she had.  He had noticed that she had not started the car yet went out to the garage and found her sitting in the car.  When he asked  her what was going on she states that she forgot how to turn on the  car.  She denies any stress, anxiety.  Neurologically she appears intact.  She is answering questions more appropriately now.  There is some hesitation when she answers but there is no aphasia, dysarthria.  Neurologically she is intact and no concern for acute stroke but given her history this could be neurologically related.  She did have a history of stroke last year and is on aspirin.  Patient denies any chest pain or shortness of breath.  This could be transient global amnesia but patient states that she does remember these events.  We will get basic labs, EKG, head CT and discussed with neurology.  CT scan overall unremarkable.  Lab work with no acute findings.  Talked with Dr. Malen Gauze with neurology who recommends admission to [facility] for MRI, EEG.  Could be stroke or could be seizures given her history of stroke and evaluating all the images.  She appears neurologically stable at this time.  She also has a loop recorder that could be interrogated as well.  This chart was dictated using voice recognition software.  Despite best efforts to proofread,  errors can occur which can change the documentation meaning.   Final Clinical Impression(s) / ED Diagnoses Final diagnoses:  Altered mental status, unspecified altered mental status type    Rx / DC Orders ED Discharge Orders     None        Lennice Sites, DO 08/02/20 1158

## 2020-08-02 NOTE — Progress Notes (Signed)
Attending text paged to inform of admit from [facility]. Awaiting orders. Tele placed. Husband at bedside.

## 2020-08-02 NOTE — ED Notes (Signed)
Patient transported to CT 

## 2020-08-02 NOTE — ED Notes (Signed)
Per husband pt.last normal behavior was 7am today.

## 2020-08-02 NOTE — ED Notes (Signed)
Report given to [facility]. 

## 2020-08-02 NOTE — ED Notes (Signed)
[facility] her to transport pt.

## 2020-08-02 NOTE — H&P (Addendum)
History and Physical    Lisa [name] KNL:[id number] DOB: 1945-04-17 DOA: 08/02/2020  PCP: Cari Caraway, MD (Confirm with patient/family/NH records and if not entered, this has to be entered at [facility] point of entry) Patient coming from: Home  I have personally briefly reviewed patient's old medical records in [place]  Chief Complaint: I was having trouble finding words.  HPI: Lisa [name] is a 75 y.o. female with medical history significant of stroke with no residual weakness, HLD, remote history of uterine CA, presented with acute transient aphasia, and forgetfulness.  Symptoms happened this morning, all of a sudden, during conversation with her husband, patient became confused, and started to answer all his questions with consistent " No".  Patient remember at that point, she could probably think and forming an idea but could not "talk it out".  And patient continued to be confused, around 930, patient stated that she planned to go to see her friend's house and walk out of the room.  However later, husband found patient sitting in the driver seat and complaining about "do not know how to start the car".  Then husband called 34.  Patient denied any headaches, blurry vision, numbness or weakness of any limbs.  She has not been taking any new medications regularly.  Last year, patient has not sent onset of left-sided weakness, MRI showed multi foci infarct within B/L corona radiata.  Patient underwent Holter monitoring which showed no A. fib.  And patient has been on aspirin and statin since.  ED Course: CT head showed multiple chronic infarcts and chronic microvascular ischemia.  Review of Systems: As per HPI otherwise 14 point review of systems negative.    Past Medical History:  Diagnosis Date   Stroke ([place]) 04/06/2019   Uterine cancer ([place]) 01/25/2016    Past Surgical History:  Procedure Laterality Date   BREAST EXCISIONAL BIOPSY Left 1975   LOOP RECORDER  INSERTION N/A 04/07/2019   Procedure: LOOP RECORDER INSERTION;  Surgeon: Evans Lance, MD;  Location: [facility];  Service: Cardiovascular;  Laterality: N/A;   TUBAL LIGATION  1970     reports that she has never smoked. She has never used smokeless tobacco. She reports current alcohol use. She reports that she does not use drugs.  Allergies  Allergen Reactions   Codeine Nausea And Vomiting   Tylenol [Acetaminophen] Hives    Family History  Problem Relation Age of Onset   Hypertension Mother    Stroke Mother    Hypertension Father      Prior to Admission medications   Medication Sig Start Date End Date Taking? Authorizing Provider  aspirin EC 81 MG EC tablet Take 1 tablet (81 mg total) by mouth daily. 04/08/19  Yes Black, Lezlie Octave, NP  atorvastatin (LIPITOR) 40 MG tablet Take 1 tablet (40 mg total) by mouth daily at 6 PM. 05/11/19  Yes McCue, Janett Billow, NP  Cholecalciferol (VITAMIN D3 PO) Take 25 mg by mouth daily.   Yes [provider]  fexofenadine (ALLEGRA) 180 MG tablet Take 180 mg by mouth daily.   Yes [provider]  Magnesium Oxide 400 MG CAPS Take 1 capsule by mouth daily.   Yes [provider]  Multiple Vitamins-Minerals (CENTRUM SILVER 50+WOMEN PO) Take 1 tablet by mouth daily.   Yes [provider]  SUPER B COMPLEX/C PO Take 1 tablet by mouth daily.   Yes [provider]  vitamin B-12 (CYANOCOBALAMIN) 100 MCG tablet Take 100 mcg  by mouth daily.   Yes [provider]    Physical Exam: Vitals:   08/02/20 1259 08/02/20 1456 08/02/20 1623 08/02/20 1739  BP: (!) 141/76 125/75 (!) 124/58 (!) 144/56  Pulse: 85 85 97 92  Resp: 15 16 15 16   Temp:   98.6 F (37 C) 98.3 F (36.8 C)  TempSrc:    Oral  SpO2: 100% 99% 100% 98%  Weight:      Height:        Constitutional: NAD, calm, comfortable Vitals:   08/02/20 1259 08/02/20 1456 08/02/20 1623 08/02/20 1739  BP: (!) 141/76 125/75 (!) 124/58 (!) 144/56  Pulse:  85 85 97 92  Resp: 15 16 15 16   Temp:   98.6 F (37 C) 98.3 F (36.8 C)  TempSrc:    Oral  SpO2: 100% 99% 100% 98%  Weight:      Height:       Eyes: PERRL, lids and conjunctivae normal ENMT: Mucous membranes are moist. Posterior pharynx clear of any exudate or lesions.Normal dentition.  Neck: normal, supple, no masses, no thyromegaly Respiratory: clear to auscultation bilaterally, no wheezing, no crackles. Normal respiratory effort. No accessory muscle use.  Cardiovascular: Regular rate and rhythm, no murmurs / rubs / gallops. No extremity edema. 2+ pedal pulses. No carotid bruits.  Abdomen: no tenderness, no masses palpated. No hepatosplenomegaly. Bowel sounds positive.  Musculoskeletal: no clubbing / cyanosis. No joint deformity upper and lower extremities. Good ROM, no contractures. Normal muscle tone.  Skin: no rashes, lesions, ulcers. No induration Neurologic: CN 2-12 grossly intact. Sensation intact, DTR normal. Strength 5/5 in all 4.  Psychiatric: Normal judgment and insight. Alert and oriented x 3. Normal mood.     Labs on Admission: I have personally reviewed following labs and imaging studies  CBC: Recent Labs  Lab 08/02/20 1038  WBC 9.9  NEUTROABS 8.0*  HGB 14.4  HCT 42.8  MCV 96.8  PLT 992   Basic Metabolic Panel: Recent Labs  Lab 08/02/20 1038  NA 140  K 4.1  CL 102  CO2 29  GLUCOSE 115*  BUN 26*  CREATININE 0.97  CALCIUM 9.6   GFR: Estimated Creatinine Clearance: 47 mL/min (by C-G formula based on SCr of 0.97 mg/dL). Liver Function Tests: Recent Labs  Lab 08/02/20 1038  AST 24  ALT 23  ALKPHOS 89  BILITOT 1.0  PROT 6.7  ALBUMIN 4.2   No results for input(s): LIPASE, AMYLASE in the last 168 hours. No results for input(s): AMMONIA in the last 168 hours. Coagulation Profile: No results for input(s): INR, PROTIME in the last 168 hours. Cardiac Enzymes: No results for input(s): CKTOTAL, CKMB, CKMBINDEX, TROPONINI in the last 168 hours. BNP  (last 3 results) No results for input(s): PROBNP in the last 8760 hours. HbA1C: No results for input(s): HGBA1C in the last 72 hours. CBG: No results for input(s): GLUCAP in the last 168 hours. Lipid Profile: No results for input(s): CHOL, HDL, LDLCALC, TRIG, CHOLHDL, LDLDIRECT in the last 72 hours. Thyroid Function Tests: No results for input(s): TSH, T4TOTAL, FREET4, T3FREE, THYROIDAB in the last 72 hours. Anemia Panel: No results for input(s): VITAMINB12, FOLATE, FERRITIN, TIBC, IRON, RETICCTPCT in the last 72 hours. Urine analysis:    Component Value Date/Time   COLORURINE YELLOW 08/02/2020 1038   APPEARANCEUR CLEAR 08/02/2020 1038   LABSPEC 1.010 08/02/2020 1038   PHURINE 6.5 08/02/2020 1038   GLUCOSEU NEGATIVE 08/02/2020 1038   [place] 08/02/2020 1038   [place]  08/02/2020 [place] 08/02/2020 1038   PROTEINUR NEGATIVE 08/02/2020 1038   NITRITE NEGATIVE 08/02/2020 1038   LEUKOCYTESUR TRACE (A) 08/02/2020 1038    Radiological Exams on Admission: CT Head Wo Contrast  Result Date: 08/02/2020 CLINICAL DATA:  Altered mental status.  History of stroke. EXAM: CT HEAD WITHOUT CONTRAST TECHNIQUE: Contiguous axial images were obtained from the base of the skull through the vertex without intravenous contrast. COMPARISON:  April 05, 2019. FINDINGS: Brain: No evidence of acute large vascular territory infarction, acute hemorrhage, hydrocephalus, extra-axial collection or mass lesion/mass effect. Similar appearance of multiple chronic infarcts, including remote infarcts in the left frontal lobe, right parietal lobe (perirolandic), left occipital lobe, left corona radiata and bilateral cerebellar hemispheres. Additional patchy white matter hypoattenuation most likely represents the sequela of chronic microvascular ischemic disease. Similar atrophy with ex vacuo ventricular dilation. Vascular: No hyperdense vessel.  Calcific atherosclerosis. Skull: No acute  fracture. Sinuses/Orbits: Visualized sinuses are largely clear. Imaged orbits are unremarkable. Other: No mastoid effusions. IMPRESSION: 1. No evidence of acute large vascular territory infarct or acute hemorrhage. 2. 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 Electronically Signed   By: Margaretha Sheffield MD   On: 08/02/2020 11:18    EKG: Independently reviewed. Sinus, borderline QTC prolongation  Assessment/Plan Active Problems:   CVA (cerebral vascular accident) ([place])   Amnesia  (please populate well all problems here in Problem List. (For example, if patient is on BP meds at home and you resume or decide to hold them, it is a problem that needs to be her. Same for CAD, COPD, HLD and so on)  Acute expressive aphasia -Rule out CVA -Ordered MRI without contrast. Echocardiogram -A1c and lipid panel -She has implant loop recorder, D/W cardiology Dr. Sallyanne Kuster, who will arrange for interrogation in AM.  Transient global amnesia -According to husband, patient has no history of dementia.  And at baseline she is not forgetful.  What happened could be a prelude to CVA or TIA. -Encephalopathy less likely, UA negative for UTI, chest x-ray no infiltrates.  She does not take any medication other than aspirin and statin. -EEG in process to rule out absent-minded seizure  Hx of CVA -Continue ASA and Statin.  HLD -Check Lipid panel.   DVT prophylaxis: Lovenox Code Status: Full Code Family Communication: Husband at bedside Disposition Plan: Expect 1-2 days hospital stay Consults called: Neuro, Cardio (for loop recorder data only) Admission status: Tele admit   Lequita Halt MD [facility] Pager [phone]  08/02/2020, 7:09 PM

## 2020-08-02 NOTE — ED Triage Notes (Signed)
Pt's husband states pt. Has "blanked out" today. Pt. Got in car and couldn't remember how to start it. Denies CP, SOB, headache, visual or speech changes. Observed gait normal. Pt. Denies weakness/tingling in extremeties.

## 2020-08-02 NOTE — ED Notes (Signed)
Returns from CT 

## 2020-08-02 NOTE — ED Notes (Signed)
Report given to Ginger RN @ [facility] 16C

## 2020-08-03 ENCOUNTER — Inpatient Hospital Stay (HOSPITAL_COMMUNITY): Payer: Medicare Other

## 2020-08-03 DIAGNOSIS — I6389 Other cerebral infarction: Secondary | ICD-10-CM

## 2020-08-03 DIAGNOSIS — I63422 Cerebral infarction due to embolism of left anterior cerebral artery: Principal | ICD-10-CM

## 2020-08-03 LAB — ECHOCARDIOGRAM COMPLETE
AR max vel: 2.17 cm2
AV Area VTI: 2.24 cm2
AV Area mean vel: 2.17 cm2
AV Mean grad: 4 mmHg
AV Peak grad: 7.4 mmHg
Ao pk vel: 1.36 m/s
Area-P 1/2: 4.89 cm2
Height: 61 in
S' Lateral: 2.9 cm
Weight: 2880 oz

## 2020-08-03 LAB — LIPID PANEL
Cholesterol: 150 mg/dL (ref 0–200)
HDL: 63 mg/dL (ref >40)
LDL Cholesterol: 74 mg/dL (ref 0–99)
Total CHOL/HDL Ratio: 2.4 RATIO
Triglycerides: 65 mg/dL (ref <150)
VLDL: 13 mg/dL (ref 0–40)

## 2020-08-03 LAB — HEMOGLOBIN A1C
Hgb A1c MFr Bld: 5.8 % — ABNORMAL HIGH (ref 4.8–5.6)
Mean Plasma Glucose: 119.76 mg/dL

## 2020-08-03 MED ORDER — IOHEXOL 350 MG/ML SOLN
75.0000 mL | Freq: Once | INTRAVENOUS | Status: AC | PRN
  Administered 2020-08-03: 75 mL via INTRAVENOUS

## 2020-08-03 NOTE — Progress Notes (Signed)
Received loop report at request of neurology.    No atrial fibrillation noted.

## 2020-08-03 NOTE — Progress Notes (Addendum)
STROKE TEAM PROGRESS NOTE   ATTENDING NOTE: I reviewed above note and agree with the assessment and plan. Pt was seen and examined.   75 year old female with history of uterine cancer 5 years ago, stroke in 03/2019 admitted for expressive aphasia and confusion.  MRI showed left ACA infarct.  CTA head and neck unremarkable.  EF 55 to 60%.  A1c 5.8, LDL 74.  Creatinine 0.97.  She was admitted in 03/2019 with MRI showing 3-4 small b/l MCA/ACA watershed infarcts. Old left frontal infarct.  CTA head and neck negative.  DVT negative.  EF 65 to 70%.  LDL 139, A1c 5.7.  Loop recorder placed.  Put on DAPT and Lipitor 40.  So far loop recorder negative for A. fib.  On exam, patient neurologically intact, no speech difficulty or arm or leg weakness.  No focal deficit.  Etiology for patient recurrent stroke unclear, consider cryptogenic stroke.  However, given her history of uterine cancer, will do CT chest abdomen pelvis to rule out malignancy.  Currently on aspirin 81 and Lipitor 40.  Will decide on antithrombotic regimen after CT chest abdomen pelvis tomorrow.  For detailed assessment and plan, please refer to above as I have made changes wherever appropriate.   Rosalin Hawking, MD PhD Stroke Neurology 08/03/2020 6:07 PM    INTERVAL HISTORY Her RN is at the bedside. She is not confused, but has some expressive aphasia.Explained the complexity of situation and that Loop did not show Afib and why CTA c/a/p ordered.   Vitals:   08/02/20 2222 08/03/20 0032 08/03/20 0407 08/03/20 0819  BP: (!) 155/77 (!) 147/71 (!) 156/83 (!) 144/90  Pulse: 91 98 85 89  Resp: 16 18 17 18   Temp: 98.1 F (36.7 C) 97.6 F (36.4 C) 98.1 F (36.7 C) 97.6 F (36.4 C)  TempSrc: Oral Oral Oral Oral  SpO2: 97% 96% 98% 98%  Weight:      Height:       CBC:  Recent Labs  Lab 08/02/20 1038  WBC 9.9  NEUTROABS 8.0*  HGB 14.4  HCT 42.8  MCV 96.8  PLT 892   Basic Metabolic Panel:  Recent Labs  Lab 08/02/20 1038  NA  140  K 4.1  CL 102  CO2 29  GLUCOSE 115*  BUN 26*  CREATININE 0.97  CALCIUM 9.6   Lipid Panel:  Recent Labs  Lab 08/03/20 0151  CHOL 150  TRIG 65  HDL 63  CHOLHDL 2.4  VLDL 13  LDLCALC 74   HgbA1c:  Recent Labs  Lab 08/03/20 0151  HGBA1C 5.8*   Urine Drug Screen: No results for input(s): LABOPIA, COCAINSCRNUR, LABBENZ, AMPHETMU, THCU, LABBARB in the last 168 hours.  Alcohol Level No results for input(s): ETH in the last 168 hours.  IMAGING past 24 hours CT Head Wo Contrast  Result Date: 08/02/2020 CLINICAL DATA:  Altered mental status.  History of stroke. EXAM: CT HEAD WITHOUT CONTRAST TECHNIQUE: Contiguous axial images were obtained from the base of the skull through the vertex without intravenous contrast. COMPARISON:  April 05, 2019. FINDINGS: Brain: No evidence of acute large vascular territory infarction, acute hemorrhage, hydrocephalus, extra-axial collection or mass lesion/mass effect. Similar appearance of multiple chronic infarcts, including remote infarcts in the left frontal lobe, right parietal lobe (perirolandic), left occipital lobe, left corona radiata and bilateral cerebellar hemispheres. Additional patchy white matter hypoattenuation most likely represents the sequela of chronic microvascular ischemic disease. Similar atrophy with ex vacuo ventricular dilation. Vascular: No hyperdense vessel.  Calcific atherosclerosis. Skull: No acute fracture. Sinuses/Orbits: Visualized sinuses are largely clear. Imaged orbits are unremarkable. Other: No mastoid effusions. IMPRESSION: 1. No evidence of acute large vascular territory infarct or acute hemorrhage. 2. 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 Electronically Signed   By: Margaretha Sheffield MD   On: 08/02/2020 11:18   MR BRAIN WO CONTRAST  Result Date: 08/03/2020 CLINICAL DATA:  75 year old female altered mental status. History of stroke. EXAM: MRI HEAD WITHOUT CONTRAST TECHNIQUE:  Multiplanar, multiecho pulse sequences of the brain and surrounding structures were obtained without intravenous contrast. COMPARISON:  Brain MRI 04/05/2019. Head CT without contrast 08/02/2020. FINDINGS: Brain: Confluent restricted diffusion in the left ACA territory, superior frontal gyrus (series 7, image 57) and an area of about 4.7 cm. T2 and FLAIR hyperintensity in keeping with cytotoxic edema. 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 No other restricted diffusion. No midline shift, mass effect, evidence of mass lesion, ventriculomegaly. Cervicomedullary junction and pituitary are within normal limits. 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 Stable chronic cerebral blood products, mostly in the left middle frontal gyrus. Chronic cortical encephalomalacia in that region, scattered in the right MCA territory, at the left occipital pole and in both posterior cerebellar hemispheres. Associated confluent bilateral white matter T2 and FLAIR hyperintensity. Small chronic lacunar infarcts in the bilateral deep gray nuclei. Brainstem remain spared. Vascular: Major intracranial vascular flow voids are stable. Mild intracranial artery tortuosity. Skull and upper cervical spine: Stable visible cervical spine. Visualized bone marrow signal is within normal limits. Sinuses/Orbits: Stable, negative. Other: Negative visible scalp and face. IMPRESSION: 1. Acute Left ACA territory infarct with cytotoxic edema but no associated hemorrhage or mass effect. 2. Otherwise stable MRI appearance of the brain since 2021 with advanced chronic ischemic disease, left hemisphere hemosiderin/siderosis. Electronically Signed   By: Genevie Ann M.D.   On: 08/03/2020 07:21    PHYSICAL EXAM General: Appears well-developed; frustrated with speech, but no acute distress. Psych: Affect appropriate to situation Eyes: No scleral  injection HENT: No OP obstrucion Head: Normocephalic.  Cardiovascular: Normal rate and regular rhythm.  Respiratory: Effort normal and breath sounds normal to anterior ascultation GI: Soft.  No distension. There is no tenderness.  Skin: WDI    Neurological Examination Mental Status: Alert, oriented, thought content appropriate.  Speech is non-fluent expressive aphasia. Able to follow 3 step commands without difficulty. Able to name objects. Cranial Nerves: II: Visual fields grossly normal,  III,IV, VI: ptosis not present, extra-ocular motions intact bilaterally, pupils equal, round, reactive to light and accommodation V,VII: smile symmetric, facial light touch sensation normal bilaterally VIII: hearing normal bilaterally IX,X: uvula rises symmetrically XI: bilateral shoulder shrug XII: midline tongue extension Motor: Right : Upper extremity   5/5    Left:     Upper extremity   5/5  Lower extremity   5/5     Lower extremity   5/5 Tone and bulk:normal tone throughout; no atrophy noted Sensory: Pinprick and light touch intact throughout, bilaterally Deep Tendon Reflexes: 2+ and symmetric throughout Plantars: Right: downgoing   Left: downgoing Cerebellar: normal finger-to-nose, normal rapid alternating movements and normal heel-to-shin test Gait: normal gait and station   ASSESSMENT/PLAN Lisa [name] is a 75 y.o. female with history of stroke with workup revealing embolic to unknown source and with no residual deficit, hx of uterine cancer who presents with episode of speech difficulty.  Stroke:  Left  ACA infarct embolic appearing; No Afib seen on Loop CTA head & neck no cervical or intracranial large vessel occlusion or stenosis. 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 MRI  Left ACA with some cytotoxic edema; multiple old infarcts and advance chronic white matter disease 2D Echo 60% EF, wnl LDL 74 HgbA1c 5.8 VTE  prophylaxis - lovenox    Diet   Diet Heart Room service appropriate? Yes; Fluid consistency: Thin   aspirin 81 mg daily prior to admission, now on aspirin 81 mg daily.  Therapy recommendations:  pending Disposition:  pending  Hypertension Home meds:  none Stable Permissive hypertension (OK if < 220/120) but gradually normalize in 5-7 days Long-term BP goal normotensive  Hyperlipidemia Home meds:  Lipid 40mg , resumed in hospital LDL 74, goal < 70 High intensity statin  Continue statin at discharge  Diabetes type II no dx HgbA1c 5.8, goal < 7.0 CBGs No results for input(s): GLUCAP in the last 72 hours.  SSI  Other Stroke Risk Factors Advanced Age >/= 52  Obesity, Body mass index is 34.01 kg/m., BMI >/= 30 associated with increased stroke risk, recommend weight loss, diet and exercise as appropriate  Hx stroke/TIA Family hx stroke (mom)  Other Active Problems Loop placed previously: Interrogated and no Afib seen H/o uterine cancer- will check CTA c/a/p to look for underlying malignacy that may be causing hypercoaguability.  Hospital day # 1  Desiree Metzger-Cihelka, ARNP-C, ANVP-BC Pager: [phone]   To contact Stroke Continuity provider, please refer to [email or web address] After hours, contact General Neurology

## 2020-08-03 NOTE — Progress Notes (Signed)
Charges reviewed and place by OTR.

## 2020-08-03 NOTE — Progress Notes (Signed)
PROGRESS NOTE  Lisa [name] KZS:[id number] DOB: 09/05/1945   PCP: Cari Caraway, MD  Patient is from: Home.  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  Of note, patient had CVA with left-sided weakness.  At that time, MRI showed multi foci infarct with an bilateral coronary radiata.  She underwent Holter monitoring that did not show arrhythmia.  She has been on aspirin and a statin with good compliance.  Neurology consulted.  Recommended routine CVA work-up. CTA head and neck with no LVO but "luminal irregularity of bilateral cervical ICA with ectasia of the left ICA and luminal irregularity with severe tortuosity of the bilateral vertebral artery suggestive of vessel wall disease such as fibromuscular dysplasia".  TTE with LVEF of 55 to 60% and G1-DD.  No significant finding on loop recorder interrogation.  Neurology ordered CT chest/abdomen/pelvis with contrast.   Subjective: Seen and examined earlier this morning.  No major events overnight of this morning.  No complaints.  Symptoms resolved.  She denies headache, vision change, focal weakness, numbness or tingling.  Per patient's husband at bedside, she is basically back to baseline.   Objective: Vitals:   08/03/20 0032 08/03/20 0407 08/03/20 0819 08/03/20 1203  BP: (!) 147/71 (!) 156/83 (!) 144/90 119/60  Pulse: 98 85 89 81  Resp: 18 17 18 18   Temp: 97.6 F (36.4 C) 98.1 F (36.7 C) 97.6 F (36.4 C) 98.2 F (36.8 C)  TempSrc: Oral Oral Oral Oral  SpO2: 96% 98% 98% 96%  Weight:      Height:        Intake/Output Summary (Last 24 hours) at 08/03/2020 1336 Last data filed at 08/03/2020 0800 Gross per 24 hour  Intake  240 ml  Output 0 ml  Net 240 ml   Filed Weights   08/02/20 1032  Weight: 81.6 kg    Examination:  GENERAL: No apparent distress.  Nontoxic. HEENT: MMM.  Vision and hearing grossly intact.  NECK: Supple.  No apparent JVD.  RESP: On RA.  No IWOB.  Fair aeration bilaterally. CVS:  RRR. Heart sounds normal.  ABD/GI/GU: BS+. Abd soft, NTND.  MSK/EXT:  Moves extremities. No apparent deformity. No edema.  SKIN: no apparent skin lesion or wound NEURO: Awake, alert and oriented appropriately. Speech clear. Cranial nerves II-XII grossly intact. Motor 5/5 in all muscle groups of UE and LE bilaterally, Normal tone. Light sensation intact in all dermatomes of upper and lower ext bilaterally. Patellar reflex symmetric.  No pronator drift.  Finger to nose intact.  11/36-year-old millimeters.  PSYCH: Calm. Normal affect.   Procedures:  None  Microbiology summarized: [id number] and influenza PCR nonreactive.  Assessment & Plan: Acute CVA of left ACA territory-noted on MRI brain.  Had transient aphasia and amnesia that has resolved.  CTA head and neck and TTE as above.  LDL 74.  A1c 5.8%.  Low penetration without significant finding.  Patient has previous CVA last year.  Has been on aspirin and statin with good compliance.  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  Aspirin failure?  May need additional antiplatelet -Continue high intensity statin -Continue PT/OT/SLP   Hyperlipidemia: LDL 74 -Continue atorvastatin  Remote history of uterine cancer -Follow CT chest/abdomen/pelvis-ordered by neurology.  Body mass index is 34.01 kg/m.         DVT prophylaxis:  enoxaparin (LOVENOX) injection 40 mg Start: 08/02/20 2000  Code Status: Full code Family Communication: Updated patient's husband at bedside. Level of care: Telemetry Medical Status is: Inpatient  Remains inpatient appropriate because:Ongoing diagnostic testing needed not appropriate for outpatient  work up, Unsafe d/c plan, and Inpatient level of care appropriate due to severity of illness  Dispo: The patient is from: Home              Anticipated d/c is to: Home              Patient currently is not medically stable to d/c.   Difficult to place patient No       Consultants:  Neurology   Sch Meds:  Scheduled Meds:  aspirin EC  81 mg Oral Daily   atorvastatin  40 mg Oral q1800   enoxaparin (LOVENOX) injection  40 mg Subcutaneous Q24H   loratadine  10 mg Oral Daily   magnesium oxide  400 mg Oral Daily   vitamin B-12  100 mcg Oral Daily   Continuous Infusions: PRN Meds:.  Antimicrobials: Anti-infectives (From admission, onward)    None        I have personally reviewed the following labs and images: CBC: Recent Labs  Lab 08/02/20 1038  WBC 9.9  NEUTROABS 8.0*  HGB 14.4  HCT 42.8  MCV 96.8  PLT 261   BMP &GFR Recent Labs  Lab 08/02/20 1038  NA 140  K 4.1  CL 102  CO2 29  GLUCOSE 115*  BUN 26*  CREATININE 0.97  CALCIUM 9.6   Estimated Creatinine Clearance: 47 mL/min (by C-G formula based on SCr of 0.97 mg/dL). Liver & Pancreas: Recent Labs  Lab 08/02/20 1038  AST 24  ALT 23  ALKPHOS 89  BILITOT 1.0  PROT 6.7  ALBUMIN 4.2   No results for input(s): LIPASE, AMYLASE in the last 168 hours. No results for input(s): AMMONIA in the last 168 hours. Diabetic: Recent Labs    08/03/20 0151  HGBA1C 5.8*   No results for input(s): GLUCAP in the last 168 hours. Cardiac Enzymes: No results for input(s): CKTOTAL, CKMB, CKMBINDEX, TROPONINI in the last 168 hours. No results for input(s): PROBNP in the last 8760 hours. Coagulation Profile: No results for input(s): INR, PROTIME in the last 168 hours. Thyroid Function Tests: No results for input(s): TSH, T4TOTAL, FREET4, T3FREE, THYROIDAB in the last 72 hours. Lipid Profile: Recent Labs    08/03/20 0151  CHOL 150  HDL 63  LDLCALC 74  TRIG 65  CHOLHDL 2.4   Anemia Panel: No results for  input(s): VITAMINB12, FOLATE, FERRITIN, TIBC, IRON, RETICCTPCT in the last 72 hours. Urine analysis:    Component Value Date/Time   COLORURINE YELLOW 08/02/2020 1038   APPEARANCEUR CLEAR 08/02/2020 1038   LABSPEC 1.010 08/02/2020 1038   PHURINE 6.5 08/02/2020 1038   GLUCOSEU NEGATIVE 08/02/2020 1038   HGBUR NEGATIVE 08/02/2020 1038   [place] 08/02/2020 1038   [place] 08/02/2020 1038   PROTEINUR NEGATIVE 08/02/2020 1038   NITRITE NEGATIVE 08/02/2020 1038   LEUKOCYTESUR TRACE (A) 08/02/2020 1038   Sepsis Labs: Invalid input(s): PROCALCITONIN, [place]  Microbiology: Recent Results (from the past 240 hour(s))  Resp Panel by RT-PCR (Flu A&B, Covid) Nasopharyngeal Swab     Status: None   Collection Time: 08/02/20 12:07 PM  Specimen: Nasopharyngeal Swab; Nasopharyngeal(NP) swabs in vial transport medium  Result Value Ref Range Status   SARS Coronavirus 2 by RT PCR NEGATIVE NEGATIVE Final    Comment: (NOTE) SARS-CoV-2 target nucleic acids are NOT DETECTED.  The SARS-CoV-2 RNA is generally detectable in upper respiratory specimens during the acute phase of infection. The lowest concentration of SARS-CoV-2 viral copies this assay can detect is 138 copies/mL. A negative result does not preclude SARS-Cov-2 infection and should not be used as the sole basis for treatment or other patient management decisions. A negative result may occur with  improper specimen collection/handling, submission of specimen other than nasopharyngeal swab, presence of viral mutation(s) within the areas targeted by this assay, and inadequate number of viral copies(<138 copies/mL). A negative result must be combined with clinical observations, patient history, and epidemiological information. The expected result is Negative.  Fact Sheet for Patients:  EntrepreneurPulse.com.au  Fact Sheet for Healthcare Providers:   IncredibleEmployment.be  This test is no t yet approved or cleared by the [place] FDA and  has been authorized for detection and/or diagnosis of SARS-CoV-2 by FDA under an Emergency Use Authorization (EUA). This EUA will remain  in effect (meaning this test can be used) for the duration of the COVID-19 declaration under Section 564(b)(1) of the Act, 21 U.S.C.section 360bbb-3(b)(1), unless the authorization is terminated  or revoked sooner.       Influenza A by PCR NEGATIVE NEGATIVE Final   Influenza B by PCR NEGATIVE NEGATIVE Final    Comment: (NOTE) The Xpert Xpress SARS-CoV-2/FLU/RSV plus assay is intended as an aid in the diagnosis of influenza from Nasopharyngeal swab specimens and should not be used as a sole basis for treatment. Nasal washings and aspirates are unacceptable for Xpert Xpress SARS-CoV-2/FLU/RSV testing.  Fact Sheet for Patients: EntrepreneurPulse.com.au  Fact Sheet for Healthcare Providers: IncredibleEmployment.be  This test is not yet approved or cleared by the [place] FDA and has been authorized for detection and/or diagnosis of SARS-CoV-2 by FDA under an Emergency Use Authorization (EUA). This EUA will remain in effect (meaning this test can be used) for the duration of the COVID-19 declaration under Section 564(b)(1) of the Act, 21 U.S.C. section 360bbb-3(b)(1), unless the authorization is terminated or revoked.  Performed at [facility], [address] [place], [place] [address]     Radiology Studies: CT ANGIO HEAD NECK W WO CM  Result Date: 08/03/2020 CLINICAL DATA:  Stroke/TIA, assess intracranial arteries. EXAM: CT HEAD WITHOUT CONTRAST CT ANGIOGRAPHY OF THE HEAD AND NECK TECHNIQUE: Contiguous axial images were obtained from the base of the skull through the vertex without intravenous contrast. Multidetector CT imaging of the head and neck was performed using the  standard protocol during bolus administration of intravenous contrast. Multiplanar CT image reconstructions and MIPs were obtained to evaluate the vascular anatomy. Carotid stenosis measurements (when applicable) are obtained utilizing NASCET criteria, using the distal internal carotid diameter as the denominator. CONTRAST:  20mL OMNIPAQUE IOHEXOL 350 MG/ML SOLN COMPARISON:  MRI of the brain August 03, 2020; CT angiogram of the head and neck April 05, 2019. FINDINGS: CT HEAD Brain: Hypodense area involving the anterior left frontal lobe, consistent with acute/subacute infarct seen on recent MRI of the brain. No new area of loss of gray-white differentiation to suggest new acute infarct. Multiple areas of encephalomalacia and gliosis are seen in the bilateral frontal, right parietal, left occipital lobe and bilateral cerebellar hemispheres. Confluent hypodensity of the white matter of the cerebral  hemispheres, nonspecific, most likely related to chronic microangiopathy. No acute hemorrhage, hydrocephalus, mass or midline shift. Vascular: No hyperdense vessel. Calcified plaques in the bilateral carotid siphons. Skull: Normal. Negative for fracture or focal lesion. Sinuses/Orbits: No acute finding. CTA NECK Aortic arch: 3 vessel aortic arch with mild atherosclerotic changes without stenosis at the origin of the major arch arteries. Right carotid system: Atherosclerotic changes along the right common carotid artery without hemodynamically significant stenosis. Minimal atherosclerotic changes at the right carotid bifurcation without stenosis. Mild luminal irregularity of the mid and upper cervical segment of the right ICA, suggesting possible fibromuscular dysplasia. Left carotid system: Mild atherosclerotic changes at the left carotid bifurcation without hemodynamically significant stenosis. Increased tortuosity and ectasias of the mid and upper cervical segment of the left ICA, may be related to vessel wall disease  suggests fibromuscular dysplasia. Vertebral arteries:The right vertebral artery is dominant. There is severe tortuosity and mild luminal irregularity of the bilateral vertebral arteries, suggesting vessel wall disease. Skeleton: Degenerative changes of the cervical spine and bilateral temporomandibular joints. No acute or aggressive process identified. Other neck: Negative. CTA HEAD Anterior circulation: Calcified plaques in the bilateral carotid siphons without hemodynamically significant stenosis. Mild luminal irregularity with mild stenosis at the right M1/MCA territory, unchanged from prior CT angiogram. A 1-2 mm wide necked outpouching from the undersurface of the left ICA may represent a tiny aneurysm versus infundibulum and is unchanged. Aplastic versus severely hypoplastic right A1/ACA segment. Bilateral ACA and MCA vascular trees are patent. Posterior circulation: Non dominant left vertebral artery supplies mostly the left PICA with hypoplastic distal V4 segment. The dominant right vertebral artery has normal course and caliber. Diminutive, patent basilar artery, particularly distal to the origin of the bilateral superior cerebellar arteries. Prominent right posterior communicating artery. Bilateral posterior cerebral arteries are patent. Venous sinuses: Patent. Anatomic variants: Hypoplastic distal V4 segment of the left vertebral artery. Aplastic versus severely hypoplastic right A1/ACA segment. IMPRESSION: 1. Acute/subacute left ACA territory infarct centered on the left superiorfrontal gyrus, as seen on prior MRI. 2. Multiple prior infarcts and advanced chronic white matter disease. 3. No cervical or intracranial large vessel occlusion or high-grade stenosis. 4. 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  Electronically Signed   By: Pedro Earls M.D.   On: 08/03/2020 10:15   MR BRAIN WO CONTRAST  Result Date: 08/03/2020 CLINICAL DATA:  75 year old female altered mental status. History of stroke. EXAM: MRI HEAD WITHOUT CONTRAST TECHNIQUE: Multiplanar, multiecho pulse sequences of the brain and surrounding structures were obtained without intravenous contrast. COMPARISON:  Brain MRI 04/05/2019. Head CT without contrast 08/02/2020. FINDINGS: Brain: Confluent restricted diffusion in the left ACA territory, superior frontal gyrus (series 7, image 57) and an area of about 4.7 cm. T2 and FLAIR hyperintensity in keeping with cytotoxic edema. 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 No other restricted diffusion. No midline shift, mass effect, evidence of mass lesion, ventriculomegaly. Cervicomedullary junction and pituitary are within normal limits. 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 Stable chronic cerebral blood products, mostly in the left middle frontal gyrus. Chronic cortical encephalomalacia in that region, scattered in the right MCA territory, at the left occipital pole and in both posterior cerebellar hemispheres. Associated confluent bilateral white matter T2 and FLAIR hyperintensity.  Small chronic lacunar infarcts in the bilateral deep gray nuclei. Brainstem remain spared. Vascular: Major intracranial vascular flow voids are stable. Mild intracranial artery tortuosity. Skull and upper cervical spine: Stable visible cervical spine. Visualized bone marrow signal is within normal limits. Sinuses/Orbits: Stable, negative. Other: Negative visible scalp and face. IMPRESSION: 1. Acute Left ACA territory infarct with cytotoxic edema but no associated hemorrhage or mass effect. 2. Otherwise stable MRI appearance of the brain since 2021 with advanced chronic ischemic disease, left  hemisphere hemosiderin/siderosis. Electronically Signed   By: Genevie Ann M.D.   On: 08/03/2020 07:21   ECHOCARDIOGRAM COMPLETE  Result Date: 08/03/2020    ECHOCARDIOGRAM REPORT   Patient Name:   ROSETTA RUPNOW [facility] Date of Exam: 08/03/2020 Medical Rec #:  [id number]              Height:       61.0 in Accession #:    [id number]             Weight:       180.0 lb Date of Birth:  07/02/1945               BSA:          1.806 m Patient Age:    43 years               BP:           156/83 mmHg Patient Gender: F                      HR:           85 bpm. Exam Location:  Inpatient Procedure: 2D Echo, Cardiac Doppler and Color Doppler Indications:    TIA  History:        Patient has prior history of Echocardiogram examinations, most                 recent 04/06/2019. Stroke.  Sonographer:    Cammy Brochure Referring Phys: [id number] [place]  1. Left ventricular ejection fraction, by estimation, is 55 to 60%. The left ventricle has normal function. The left ventricle has no regional wall motion abnormalities. Left ventricular diastolic parameters are consistent with Grade I diastolic dysfunction (impaired relaxation).  2. Right ventricular systolic function is normal. The right ventricular size is normal.  3. The mitral valve is normal in structure. Mild to moderate mitral valve regurgitation. No evidence of mitral stenosis.  4. The aortic valve is normal in structure. Aortic valve regurgitation is mild. No aortic stenosis is present.  5. The inferior vena cava is normal in size with greater than 50% respiratory variability, suggesting right atrial pressure of 3 mmHg. Conclusion(s)/Recommendation(s): No intracardiac source of embolism detected on this transthoracic study. A transesophageal echocardiogram is recommended to exclude cardiac source of embolism if clinically indicated. FINDINGS  Left Ventricle: Left ventricular ejection fraction, by estimation, is 55 to 60%. The left ventricle has normal function.  The left ventricle has no regional wall motion abnormalities. The left ventricular internal cavity size was normal in size. There is  no left ventricular hypertrophy. Left ventricular diastolic parameters are consistent with Grade I diastolic dysfunction (impaired relaxation). Right Ventricle: The right ventricular size is normal. No increase in right ventricular wall thickness. Right ventricular systolic function is normal. Left Atrium: Left atrial size was normal in size. Right Atrium: Right atrial size was normal in size. Pericardium: There is no evidence of pericardial effusion. Mitral  Valve: The mitral valve is normal in structure. Mild to moderate mitral valve regurgitation, with eccentric anteriorly directed jet. No evidence of mitral valve stenosis. Tricuspid Valve: The tricuspid valve is normal in structure. Tricuspid valve regurgitation is not demonstrated. No evidence of tricuspid stenosis. Aortic Valve: The aortic valve is normal in structure. Aortic valve regurgitation is mild. No aortic stenosis is present. Aortic valve mean gradient measures 4.0 mmHg. Aortic valve peak gradient measures 7.4 mmHg. Aortic valve area, by VTI measures 2.24 cm. Pulmonic Valve: The pulmonic valve was normal in structure. Pulmonic valve regurgitation is not visualized. No evidence of pulmonic stenosis. Aorta: The aortic root is normal in size and structure. Venous: The inferior vena cava is normal in size with greater than 50% respiratory variability, suggesting right atrial pressure of 3 mmHg. IAS/Shunts: No atrial level shunt detected by color flow Doppler.  LEFT VENTRICLE PLAX 2D LVIDd:         4.80 cm  Diastology LVIDs:         2.90 cm  LV e' medial:    7.83 cm/s LV PW:         1.10 cm  LV E/e' medial:  8.4 LV IVS:        0.90 cm  LV e' lateral:   8.92 cm/s LVOT diam:     1.90 cm  LV E/e' lateral: 7.4 LV SV:         58 LV SV Index:   32 LVOT Area:     2.84 cm  RIGHT VENTRICLE             IVC RV Basal diam:  3.10 cm      IVC diam: 1.10 cm RV S prime:     10.40 cm/s LEFT ATRIUM             Index       RIGHT ATRIUM           Index LA diam:        3.10 cm 1.72 cm/m  RA Area:     11.60 cm LA Vol (A2C):   32.0 ml 17.72 ml/m RA Volume:   26.40 ml  14.62 ml/m LA Vol (A4C):   33.6 ml 18.60 ml/m LA Biplane Vol: 33.0 ml 18.27 ml/m  AORTIC VALVE AV Area (Vmax):    2.17 cm AV Area (Vmean):   2.17 cm AV Area (VTI):     2.24 cm AV Vmax:           136.00 cm/s AV Vmean:          90.500 cm/s AV VTI:            0.257 m AV Peak Grad:      7.4 mmHg AV Mean Grad:      4.0 mmHg LVOT Vmax:         104.00 cm/s LVOT Vmean:        69.200 cm/s LVOT VTI:          0.203 m LVOT/AV VTI ratio: 0.79  AORTA Ao Root diam: 2.90 cm Ao Asc diam:  3.00 cm MITRAL VALVE MV Area (PHT): 4.89 cm     SHUNTS MV Decel Time: 155 msec     Systemic VTI:  0.20 m MV E velocity: 66.00 cm/s   Systemic Diam: 1.90 cm MV A velocity: 111.00 cm/s MV E/A ratio:  0.59 Lisa Furbish MD Electronically signed by Lisa Furbish MD Signature Date/Time: 08/03/2020/12:52:03 PM    Final  Lisa [name]  If 7PM-7AM, please contact night-coverage [email or web address] 08/03/2020, 1:36 PM

## 2020-08-03 NOTE — Evaluation (Signed)
Physical Therapy Evaluation & Discharge Patient Details Name: Lisa [name] MRN: [id number] DOB: November 20, 1945 Today's Date: 08/03/2020   History of Present Illness  75 y/o female presented to [facility] ED on 7/14 with confusion and aphasia. CTH was negative but with multiple chronic infarcts and chronic microvascular ischemia disease. 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 PMH: CVA 03/2019, uterine cancer 01/2016  Clinical Impression  PTA, patient lives with husband and reports independence with mobility. Patient currently functioning at independent level with no AD and able to safely negotiate stairs with R handrail. Educated patient on benefits of aerobic exercise and reducing risk factors for recurrent strokes. No further skilled PT needs required acutely. No PT follow up recommended at this time.     Follow Up Recommendations No PT follow up    Equipment Recommendations  None recommended by PT    Recommendations for Other Services       Precautions / Restrictions Precautions Precautions: None Restrictions Weight Bearing Restrictions: No      Mobility  Bed Mobility Overal bed mobility: Independent                  Transfers Overall transfer level: Independent                  Ambulation/Gait Ambulation/Gait assistance: Independent Gait Distance (Feet): 200 Feet Assistive device: None Gait Pattern/deviations: WFL(Within Functional Limits)   Gait velocity interpretation: >4.37 ft/sec, indicative of normal walking speed    Stairs Stairs: Yes Stairs assistance: Independent Stair Management: One rail Right;Alternating pattern;Forwards Number of Stairs: 6    Wheelchair Mobility    Modified Rankin (Stroke Patients Only) Modified Rankin (Stroke Patients Only) Pre-Morbid Rankin Score: No symptoms Modified Rankin: No symptoms     Balance Overall balance assessment: No apparent balance deficits (not formally assessed)                                            Pertinent Vitals/Pain Pain Assessment: No/denies pain    Home Living Family/patient expects to be discharged to:: Private residence Living Arrangements: Spouse/significant other Available Help at Discharge: Family;Available 24 hours/day Type of Home: House Home Access: Stairs to enter Entrance Stairs-Rails: Right Entrance Stairs-Number of Steps: 6 Home Layout: One level Home Equipment: Shower seat - built in;Luara Faye - 2 wheels;Cane - single point      Prior Function Level of Independence: Independent         Comments: driving, no falls reported in past 3 months     Hand Dominance        Extremity/Trunk Assessment   Upper Extremity Assessment Upper Extremity Assessment: Overall WFL for tasks assessed    Lower Extremity Assessment Lower Extremity Assessment: Overall WFL for tasks assessed    Cervical / Trunk Assessment Cervical / Trunk Assessment: Normal  Communication   Communication: No difficulties  Cognition Arousal/Alertness: Awake/alert Behavior During Therapy: WFL for tasks assessed/performed Overall Cognitive Status: Within Functional Limits for tasks assessed                                        General Comments      Exercises     Assessment/Plan    PT Assessment Patent does not need any further PT services  PT Problem  List         PT Treatment Interventions      PT Goals (Current goals can be found in the Care Plan section)  Acute Rehab PT Goals Patient Stated Goal: to go home PT Goal Formulation: All assessment and education complete, DC therapy    Frequency     Barriers to discharge        Co-evaluation               AM-PAC PT "6 Clicks" Mobility  Outcome Measure Help needed turning from your back to your side while in a flat bed without using bedrails?: None Help needed moving from lying on your back to sitting on the side of a flat bed without  using bedrails?: None Help needed moving to and from a bed to a chair (including a wheelchair)?: None Help needed standing up from a chair using your arms (e.g., wheelchair or bedside chair)?: None Help needed to walk in hospital room?: None Help needed climbing 3-5 steps with a railing? : None 6 Click Score: 24    End of Session   Activity Tolerance: Patient tolerated treatment well Patient left: in bed;with call bell/phone within reach;with family/visitor present Nurse Communication: Mobility status PT Visit Diagnosis: Unsteadiness on feet (R26.81)    Time: 0569-7948 PT Time Calculation (min) (ACUTE ONLY): 9 min   Charges:   PT Evaluation $PT Eval Low Complexity: 1 Low          Katherin Ramey A. Gilford Rile PT, DPT Acute Rehabilitation Services Pager [phone] Office [phone]   Linna Hoff 08/03/2020, 9:39 AM

## 2020-08-03 NOTE — Evaluation (Signed)
Occupational Therapy Evaluation Patient Details Name: Lisa [name] MRN: [id number] DOB: 10-May-1945 Today's Date: 08/03/2020    History of Present Illness 75 y/o female presented to [facility] ED on 7/14 with confusion and aphasia. CTH was negative but with multiple chronic infarcts and chronic microvascular ischemia disease. 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 PMH: CVA 03/2019, uterine cancer 01/2016   Clinical Impression   PTA, pt was independent and lived with her husband. Pt performing LB ADL and functional mobility with supervision for safety. Pt performing path finding task during session requiring min cues to perform last step of path finding task. Pt reporting she continues to feel a bit foggy. Pt presenting with decreased STM, command following, and awareness. Recommend discharge with no follow up OT and will continue to follow acutely to optimize safety and independence with ADL/IADLs.     Follow Up Recommendations  No OT follow up    Equipment Recommendations  None recommended by OT    Recommendations for Other Services       Precautions / Restrictions Precautions Precautions: None Restrictions Weight Bearing Restrictions: No      Mobility Bed Mobility Overal bed mobility: Modified Independent             General bed mobility comments: Increaed time.    Transfers Overall transfer level: Needs assistance Equipment used: None Transfers: Sit to/from Stand Sit to Stand: Supervision         General transfer comment: Supervision for ambulatory transfers due to decreased balance    Balance Overall balance assessment: Mild deficits observed, not formally tested                                         ADL either performed or assessed with clinical judgement   ADL Overall ADL's : Needs assistance/impaired Eating/Feeding: Modified independent;Sitting   Grooming: Modified independent;Sitting   Upper Body  Bathing: Modified independent;Sitting   Lower Body Bathing: Sit to/from stand;Supervison/ safety   Upper Body Dressing : Modified independent;Sitting   Lower Body Dressing: Sit to/from stand;Supervision/safety   Toilet Transfer: Ambulation;Supervision/safety;Regular Toilet   Toileting- Clothing Manipulation and Hygiene: Modified independent;Sit to/from stand   Tub/ Shower Transfer: Supervision/safety;Walk-in shower;Ambulation   Functional mobility during ADLs: Supervision/safety General ADL Comments: Supervision during functional mobility due to decreased balance with gait. Pt with one LOB, but able to self correct. Pt reporting this is normal for her. Mod I for all other IADLs for increased time.     Vision Patient Visual Report: No change from baseline Vision Assessment?: No apparent visual deficits Additional Comments: Pt reporting she has not had any changes in vision.     Perception Perception Perception Tested?: No Comments: no apparent difficulty with perception   Praxis Praxis Praxis tested?: Within functional limits Praxis-Other Comments: No apparent difficulty with motor planning. No dysdiadokinesia noted. Also performing finger to nose test with good eye tracking and targeting.    Pertinent Vitals/Pain Pain Assessment: No/denies pain     Hand Dominance Right   Extremity/Trunk Assessment Upper Extremity Assessment Upper Extremity Assessment: Overall WFL for tasks assessed   Lower Extremity Assessment Lower Extremity Assessment: Defer to PT evaluation   Cervical / Trunk Assessment Cervical / Trunk Assessment: Normal   Communication Communication Communication: No difficulties   Cognition Arousal/Alertness: Awake/alert Behavior During Therapy: WFL for tasks assessed/performed Overall Cognitive Status: Impaired/Different from baseline Area  of Impairment: Memory;Awareness;Following commands;Attention                   Current Attention Level:  Selective;Sustained Memory: Decreased short-term memory Following Commands: Follows multi-step commands with increased time   Awareness: Emergent   General Comments: Pt performing path finding task with min cues for attention to detail during third step of task to enter second door on right. Pt with difficulty carryig conversation and attenting to task. Pt reporting she continues to feel her cognition is slightly foggy, but feels close to baseline.   General Comments  Pt pleasant and conversational throughout. Reviewing BE-FAST acronym with pt and husband.    Exercises     Shoulder Instructions      Home Living Family/patient expects to be discharged to:: Private residence Living Arrangements: Spouse/significant other Available Help at Discharge: Family;Available 24 hours/day Type of Home: House Home Access: Stairs to enter [employer] of Steps: 6 Entrance Stairs-Rails: Right Home Layout: One level     Bathroom Shower/Tub: Occupational psychologist: Standard     Home Equipment: Shower seat - built in;Walker - 2 wheels;Cane - single point;Hand held shower head;Grab bars - tub/shower          Prior Functioning/Environment Level of Independence: Independent        Comments: driving and performing ADL/IADL independently.        OT Problem List: Decreased cognition;Impaired balance (sitting and/or standing)      OT Treatment/Interventions: Self-care/ADL training;Therapeutic exercise;DME and/or AE instruction;Therapeutic activities;Cognitive remediation/compensation;Patient/family education    OT Goals(Current goals can be found in the care plan section) Acute Rehab OT Goals Patient Stated Goal: to go home OT Goal Formulation: With patient/family Time For Goal Achievement: 08/03/20 Potential to Achieve Goals: Good  OT Frequency: Min 2X/week   Barriers to D/C:            Co-evaluation              AM-PAC OT "6 Clicks" Daily Activity      Outcome Measure Help from another person eating meals?: None Help from another person taking care of personal grooming?: None Help from another person toileting, which includes using toliet, bedpan, or urinal?: A Little Help from another person bathing (including washing, rinsing, drying)?: A Little Help from another person to put on and taking off regular upper body clothing?: None Help from another person to put on and taking off regular lower body clothing?: A Little 6 Click Score: 21   End of Session Equipment Utilized During Treatment: Gait belt Nurse Communication: Mobility status  Activity Tolerance: Patient tolerated treatment well Patient left: in bed;with call bell/phone within reach  OT Visit Diagnosis: Unsteadiness on feet (R26.81);Other symptoms and signs involving cognitive function                Time: 1005-1024 OT Time Calculation (min): 19 min Charges:  OT General Charges $OT Visit: 1 Visit OT Evaluation $OT Eval Moderate Complexity: 1 [address] OTDS   Shanda Howells 08/03/2020, 10:54 AM

## 2020-08-03 NOTE — TOC CAGE-AID Note (Signed)
Transition of Care [facility]) - CAGE-AID Screening   Patient Details  Name: Lisa [name] MRN: [id number] Date of Birth: 1945-11-08  Transition of Care [facility]) CM/SW Contact:    Marily Konczal C Tarpley-Carter, [place] Phone Number: 08/03/2020, 1:11 PM   Clinical Narrative: Pt is unable to participate in Cage Aid, due to confusion.   Yani Coventry Tarpley-Carter, MSW, LCSW-A Pronouns:  She/Her/Hers                          [place] Clinical Social WorkerTransitions of Care Cell:  [phone] [email or web address]   CAGE-AID Screening: Substance Abuse Screening unable to be completed due to: : Patient unable to participate

## 2020-08-04 ENCOUNTER — Inpatient Hospital Stay (HOSPITAL_COMMUNITY): Payer: Medicare Other

## 2020-08-04 DIAGNOSIS — I639 Cerebral infarction, unspecified: Secondary | ICD-10-CM

## 2020-08-04 MED ORDER — CLOPIDOGREL BISULFATE 75 MG PO TABS: 75.0000 mg | ORAL_TABLET | Freq: Every day | ORAL | 0 refills | Status: AC

## 2020-08-04 MED ORDER — IOHEXOL 300 MG/ML  SOLN
100.0000 mL | Freq: Once | INTRAMUSCULAR | Status: AC | PRN
  Administered 2020-08-04: 100 mL via INTRAVENOUS

## 2020-08-04 MED ORDER — IOHEXOL 9 MG/ML PO SOLN
ORAL | Status: AC
  Administered 2020-08-04: 500 mL
  Filled 2020-08-04: qty 1000

## 2020-08-04 NOTE — Progress Notes (Signed)
STROKE TEAM PROGRESS NOTE     INTERVAL HISTORY Her husband is at the bedside. She continues to have  some expressive aphasia.and word finding difficulties.  CT scan of the chest abdomen and pelvis is pending.  Patient had by cerebral embolic infarcts in March 2021 with negative work-up and had loop recorder inserted which so far has not seen paroxysmal A. fib with last report on 07/24/2020  Vitals:   08/04/20 0327 08/04/20 0449 08/04/20 0828 08/04/20 1111  BP: (!) 82/58 129/78 (!) 123/53 136/67  Pulse: 89 85 83 80  Resp: 16  18 18   Temp: 98 F (36.7 C)   98 F (36.7 C)  TempSrc: Oral   Oral  SpO2: 98%  95% 96%  Weight:      Height:       CBC:  Recent Labs  Lab 08/02/20 1038  WBC 9.9  NEUTROABS 8.0*  HGB 14.4  HCT 42.8  MCV 96.8  PLT 694   Basic Metabolic Panel:  Recent Labs  Lab 08/02/20 1038  NA 140  K 4.1  CL 102  CO2 29  GLUCOSE 115*  BUN 26*  CREATININE 0.97  CALCIUM 9.6   Lipid Panel:  Recent Labs  Lab 08/03/20 0151  CHOL 150  TRIG 65  HDL 63  CHOLHDL 2.4  VLDL 13  LDLCALC 74   HgbA1c:  Recent Labs  Lab 08/03/20 0151  HGBA1C 5.8*   Urine Drug Screen: No results for input(s): LABOPIA, COCAINSCRNUR, LABBENZ, AMPHETMU, THCU, LABBARB in the last 168 hours.  Alcohol Level No results for input(s): ETH in the last 168 hours.  IMAGING past 24 hours No results found.  PHYSICAL EXAM General: Pleasant elderly Caucasian lady; frustrated with speech difficulty, but no acute distress. Psych: Affect appropriate to situation Eyes: No scleral injection HENT: No OP obstrucion Head: Normocephalic.  Cardiovascular: Normal rate and regular rhythm.  Respiratory: Effort normal and breath sounds normal to anterior ascultation GI: Soft.  No distension. There is no tenderness.  Skin: WDI    Neurological Examination Mental Status: Alert, oriented, thought content appropriate.  Speech is non-fluent expressive aphasia.  With word finding difficulties.  Intact  comprehension and repetition.  Naming is poor able to follow 3 step commands without difficulty. Able to name 4 animals which can walk on 4 legs cranial Nerves: II: Visual fields grossly normal,  III,IV, VI: ptosis not present, extra-ocular motions intact bilaterally, pupils equal, round, reactive to light and accommodation V,VII: smile symmetric, facial light touch sensation normal bilaterally VIII: hearing normal bilaterally IX,X: uvula rises symmetrically XI: bilateral shoulder shrug XII: midline tongue extension Motor: Right : Upper extremity   5/5    Left:     Upper extremity   5/5  Lower extremity   5/5     Lower extremity   5/5 Tone and bulk:normal tone throughout; no atrophy noted Sensory: Pinprick and light touch intact throughout, bilaterally Deep Tendon Reflexes: 2+ and symmetric throughout Plantars: Right: downgoing   Left: downgoing Cerebellar: normal finger-to-nose, normal rapid alternating movements and normal heel-to-shin test Gait: normal gait and station   NIHSS 1 for mild expressive aphasia.  Premorbid modified Rankin scale of 0 ASSESSMENT/PLAN [name] is a 75 y.o. female with history of stroke with workup revealing embolic to unknown source and with no residual deficit, hx of uterine cancer who presents with episode of speech difficulty.  Stroke:  Left ACA infarct embolic appearing; No Afib seen on Loop CTA head & neck no  cervical or intracranial large vessel occlusion or stenosis. 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 MRI  Left ACA with some cytotoxic edema; multiple old infarcts and advance chronic white matter disease 2D Echo 60% EF, wnl LDL 74 HgbA1c 5.8 VTE prophylaxis - lovenox    Diet   Diet Heart Room service appropriate? Yes; Fluid consistency: Thin   aspirin 81 mg daily prior to admission, now on aspirin 81 mg daily.  Therapy recommendations:  pending Disposition:   pending  Hypertension Home meds:  none Stable Permissive hypertension (OK if < 220/120) but gradually normalize in 5-7 days Long-term BP goal normotensive  Hyperlipidemia Home meds:  Lipid 40mg , resumed in hospital LDL 74, goal < 70 High intensity statin  Continue statin at discharge  Diabetes type II no dx HgbA1c 5.8, goal < 7.0 CBGs No results for input(s): GLUCAP in the last 72 hours.  SSI  Other Stroke Risk Factors Advanced Age >/= 65  Obesity, Body mass index is 34.01 kg/m., BMI >/= 30 associated with increased stroke risk, recommend weight loss, diet and exercise as appropriate  Hx stroke/TIA Family hx stroke (mom)  Other Active Problems Loop placed previously: Interrogated and no Afib seen H/o uterine cancer- will check CTA c/a/p to look for underlying malignacy that may be causing hypercoaguability.  Hospital day # 2 I have personally obtained history,examined this patient, reviewed notes, independently viewed imaging studies, participated in medical decision making and plan of care.ROS completed by me personally and pertinent positives fully documented  I have made any additions or clarifications directly to the above note. Agree with note above.  Continue aspirin and Plavix for 3 weeks followed by Plavix alone.  Patient appears interested in participating in the [place] trial for stroke prevention and will be given information to review and decide.  Long discussion with patient and husband and answered questions.  Discussed with Dr. Sherrian Divers.  Greater than 50% time during this 25-minute visit was spent in counseling and coordination of care and discussion with patient, husband and care team and answering questions  Antony Contras, MD Medical Director [place] Pager: [phone] 08/04/2020 2:21 PM   To contact Stroke Continuity provider, please refer to [email or web address] After hours, contact General Neurology

## 2020-08-04 NOTE — Plan of Care (Signed)
Patient is alert oriented x 4. Ambulatory, pt has some expressive aphasia. No distress noted.   Problem: Education: Goal: Knowledge of General Education information will improve Description: Including pain rating scale, medication(s)/side effects and non-pharmacologic comfort measures Outcome: Progressing   Problem: Health Behavior/Discharge Planning: Goal: Ability to manage health-related needs will improve Outcome: Progressing   Problem: Clinical Measurements: Goal: Ability to maintain clinical measurements within normal limits will improve Outcome: Progressing Goal: Will remain free from infection Outcome: Progressing Goal: Diagnostic test results will improve Outcome: Progressing Goal: Respiratory complications will improve Outcome: Progressing Goal: Cardiovascular complication will be avoided Outcome: Progressing   Problem: Activity: Goal: Risk for activity intolerance will decrease Outcome: Progressing   Problem: Nutrition: Goal: Adequate nutrition will be maintained Outcome: Progressing   Problem: Coping: Goal: Level of anxiety will decrease Outcome: Progressing   Problem: Elimination: Goal: Will not experience complications related to bowel motility Outcome: Progressing Goal: Will not experience complications related to urinary retention Outcome: Progressing   Problem: Pain Managment: Goal: General experience of comfort will improve Outcome: Progressing   Problem: Safety: Goal: Ability to remain free from injury will improve Outcome: Progressing   Problem: Skin Integrity: Goal: Risk for impaired skin integrity will decrease Outcome: Progressing

## 2020-08-04 NOTE — Discharge Instructions (Signed)
Please take both aspirin and plavix daily for 3 weeks, then stop aspirin but continue plavix alone afterwards

## 2020-08-04 NOTE — Discharge Summary (Signed)
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  Of note, patient had CVA with left-sided weakness.  At that time, MRI showed multi foci infarct with an bilateral coronary radiata.  She underwent Holter monitoring that did not show arrhythmia.  She has been on aspirin and a statin with good compliance.  Neurology consulted.  Recommended routine CVA work-up. CTA head and neck with no LVO but "luminal irregularity of bilateral cervical ICA with ectasia of the left ICA and luminal irregularity with severe tortuosity of the bilateral vertebral artery suggestive of vessel wall disease such as fibromuscular dysplasia".  TTE with LVEF of 55 to 60% and G1-DD.  No significant finding on loop recorder interrogation.  Neurology ordered CT chest/abdomen/pelvis with contrast which was found to be neg for malignancy  Discharge Diagnoses:  Active Problems:   CVA (cerebral vascular accident) ([place])   Amnesia  Acute CVA of left ACA territory-noted on MRI brain.  Had transient aphasia and amnesia that has resolved.  CTA head and neck and TTE as above.  LDL 74.  A1c 5.8%.  Low penetration without significant finding.  Patient has previous CVA last year.  Has been on aspirin and statin with good compliance.   -Neurology consulted -Currently on aspirin.  discussed with Dr. Leonie Man. Recommendation for combination ASA and  plavix x 3 weeks, then plavix alone afterwards -Continue high intensity statin -therapy recs with no home needs identified   Hyperlipidemia: LDL 74 -Continue atorvastatin   Remote history of uterine cancer -Reviewed f/u CT chest/abd/pelvis, neg for malignancy   Discharge Instructions  Discharge Instructions     Ambulatory referral to Neurology   Complete by: As directed    An appointment is requested in approximately: 4 weeks      Allergies as of 08/04/2020       Reactions   Codeine Nausea And Vomiting   Tylenol [acetaminophen] Hives        Medication List     TAKE these medications    aspirin 81 MG EC tablet Take 1 tablet (81 mg total) by mouth daily.   atorvastatin 40 MG tablet Commonly known as: LIPITOR Take 1 tablet (40 mg total) by mouth daily at 6 PM.   CENTRUM SILVER 50+WOMEN PO Take 1 tablet by mouth daily.   clopidogrel 75 MG tablet Commonly known as: Plavix Take 1 tablet (75 mg total) by mouth daily.   fexofenadine 180 MG tablet Commonly known as: ALLEGRA Take 180 mg by mouth daily.   Magnesium Oxide 400 MG Caps Take 1 capsule by mouth daily.   SUPER B COMPLEX/C PO Take 1 tablet by mouth daily.   vitamin B-12 100 MCG tablet Commonly known as: CYANOCOBALAMIN Take 100 mcg by mouth daily.   VITAMIN D3 PO Take 25 mg by mouth daily.        Follow-up Information     Cari Caraway, MD Follow up in 2 week(s).   Specialty: Family Medicine Why: Hospital follow up  Contact information: [place] [address] [phone]         Garvin Fila, MD Follow up.   Specialties: Neurology, Radiology Why: as scheduled Contact information: [place] [address] [phone]                Allergies  Allergen Reactions   Codeine Nausea And Vomiting   Tylenol [Acetaminophen] Hives    Consultations: Neurology  Procedures/Studies: CT ANGIO HEAD NECK W WO CM  Result Date:  08/03/2020 CLINICAL DATA:  Stroke/TIA, assess intracranial arteries. EXAM: CT HEAD WITHOUT CONTRAST CT ANGIOGRAPHY OF THE HEAD AND NECK TECHNIQUE: Contiguous axial images were obtained from the base of the skull through the vertex without intravenous contrast. Multidetector CT imaging of the head and neck was performed using the standard protocol during bolus administration of intravenous contrast. Multiplanar CT image reconstructions and MIPs were obtained to evaluate the vascular anatomy. Carotid stenosis measurements (when applicable) are obtained utilizing NASCET criteria, using the distal internal carotid diameter as the denominator. CONTRAST:  15mL OMNIPAQUE IOHEXOL 350 MG/ML SOLN COMPARISON:  MRI of the brain August 03, 2020; CT angiogram of the head and neck April 05, 2019. FINDINGS: CT HEAD Brain: Hypodense area involving the anterior left frontal lobe, consistent with acute/subacute infarct seen on recent MRI of the brain. No new area of loss of gray-white differentiation to suggest new acute infarct. Multiple areas of encephalomalacia and gliosis are seen in the bilateral frontal, right parietal, left occipital lobe and bilateral cerebellar hemispheres. Confluent hypodensity of the white matter of the cerebral hemispheres, nonspecific, most likely related to chronic microangiopathy. No acute hemorrhage, hydrocephalus, mass or midline shift. Vascular: No hyperdense vessel. Calcified plaques in the bilateral carotid siphons. Skull: Normal. Negative for fracture or focal lesion. Sinuses/Orbits: No acute finding. CTA NECK Aortic arch: 3 vessel aortic arch with mild atherosclerotic changes without stenosis at the origin of the major arch arteries. Right carotid system: Atherosclerotic changes along the right common carotid artery without hemodynamically significant stenosis. Minimal atherosclerotic changes at the right carotid bifurcation without stenosis. Mild luminal irregularity of the mid and upper cervical  segment of the right ICA, suggesting possible fibromuscular dysplasia. Left carotid system: Mild atherosclerotic changes at the left carotid bifurcation without hemodynamically significant stenosis. Increased tortuosity and ectasias of the mid and upper cervical segment of the left ICA, may be related to vessel wall disease suggests fibromuscular dysplasia. Vertebral arteries:The right vertebral artery is dominant. There is severe tortuosity and mild luminal irregularity of the bilateral vertebral arteries, suggesting vessel wall disease. Skeleton: Degenerative changes of the cervical spine and bilateral temporomandibular joints. No acute or aggressive process identified. Other neck: Negative. CTA HEAD Anterior circulation: Calcified plaques in the bilateral carotid siphons without hemodynamically significant stenosis. Mild luminal irregularity with mild stenosis at the right M1/MCA territory, unchanged from prior CT angiogram. A 1-2 mm wide necked outpouching from the undersurface of the left ICA may represent a tiny aneurysm versus infundibulum and is unchanged. Aplastic versus severely hypoplastic right A1/ACA segment. Bilateral ACA and MCA vascular trees are patent. Posterior circulation: Non dominant left vertebral artery supplies mostly the left PICA with hypoplastic distal V4 segment. The dominant right vertebral artery has normal course and caliber. Diminutive, patent basilar artery, particularly distal to the origin of the bilateral superior cerebellar arteries. Prominent right posterior communicating artery. Bilateral posterior cerebral arteries are patent. Venous sinuses: Patent. Anatomic variants: Hypoplastic distal V4 segment of the left vertebral artery. Aplastic  versus severely hypoplastic right A1/ACA segment. IMPRESSION: 1. Acute/subacute left ACA territory infarct centered on the left superiorfrontal gyrus, as seen on prior MRI. 2. Multiple prior infarcts and advanced chronic white matter disease.  3. No cervical or intracranial large vessel occlusion or high-grade stenosis. 4. 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 Electronically Signed   By: Pedro Earls M.D.   On: 08/03/2020 10:15   CT Head Wo Contrast  Result Date: 08/02/2020 CLINICAL DATA:  Altered mental status.  History of stroke. EXAM: CT HEAD WITHOUT CONTRAST TECHNIQUE: Contiguous axial images were obtained from the base of the skull through the vertex without intravenous contrast. COMPARISON:  April 05, 2019. FINDINGS: Brain: No evidence of acute large vascular territory infarction, acute hemorrhage, hydrocephalus, extra-axial collection or mass lesion/mass effect. Similar appearance of multiple chronic infarcts, including remote infarcts in the left frontal lobe, right parietal lobe (perirolandic), left occipital lobe, left corona radiata and bilateral cerebellar hemispheres. Additional patchy white matter hypoattenuation most likely represents the sequela of chronic microvascular ischemic disease. Similar atrophy with ex vacuo ventricular dilation. Vascular: No hyperdense vessel.  Calcific atherosclerosis. Skull: No acute fracture. Sinuses/Orbits: Visualized sinuses are largely clear. Imaged orbits are unremarkable. Other: No mastoid effusions. IMPRESSION: 1. No evidence of acute large vascular territory infarct or acute hemorrhage. 2. 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 Electronically Signed   By: Margaretha Sheffield MD   On: 08/02/2020 11:18   MR BRAIN WO CONTRAST  Result Date: 08/03/2020 CLINICAL DATA:  75 year old female altered mental status. History of stroke. EXAM: MRI HEAD WITHOUT CONTRAST TECHNIQUE: Multiplanar, multiecho pulse sequences of the brain and surrounding structures were obtained  without intravenous contrast. COMPARISON:  Brain MRI 04/05/2019. Head CT without contrast 08/02/2020. FINDINGS: Brain: Confluent restricted diffusion in the left ACA territory, superior frontal gyrus (series 7, image 57) and an area of about 4.7 cm. T2 and FLAIR hyperintensity in keeping with cytotoxic edema. 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 No other restricted diffusion. No midline shift, mass effect, evidence of mass lesion, ventriculomegaly. Cervicomedullary junction and pituitary are within normal limits. 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 Stable chronic cerebral blood products, mostly in the left middle frontal gyrus. Chronic cortical encephalomalacia in that region, scattered in the right MCA territory, at the left occipital pole and in both posterior cerebellar hemispheres. Associated confluent bilateral white matter T2 and FLAIR hyperintensity. Small chronic lacunar infarcts in the bilateral deep gray nuclei. Brainstem remain spared. Vascular: Major intracranial vascular flow voids are stable. Mild intracranial artery tortuosity. Skull and upper cervical spine: Stable visible cervical spine. Visualized bone marrow signal is within normal limits. Sinuses/Orbits: Stable, negative. Other: Negative visible scalp and face. IMPRESSION: 1. Acute Left ACA territory infarct with cytotoxic edema but no associated hemorrhage or mass effect. 2. Otherwise stable MRI appearance of the brain since 2021 with advanced chronic ischemic disease, left hemisphere hemosiderin/siderosis. Electronically Signed   By: Genevie Ann M.D.   On: 08/03/2020 07:21   CT CHEST ABDOMEN PELVIS W CONTRAST  Result Date: 08/04/2020 CLINICAL DATA:  Cancer of unknown primary. EXAM: CT CHEST, ABDOMEN, AND PELVIS WITH CONTRAST TECHNIQUE: Multidetector CT imaging of the chest, abdomen and pelvis was performed following the  standard protocol during bolus administration  of intravenous contrast. CONTRAST:  116mL OMNIPAQUE IOHEXOL 300 MG/ML  SOLN COMPARISON:  None. FINDINGS: CT CHEST FINDINGS Cardiovascular: Atheromatous calcifications, including the coronary arteries and aorta. Mediastinum/Nodes: No enlarged mediastinal, hilar, or axillary lymph nodes. Thyroid gland, trachea, and esophagus demonstrate no significant findings. Lungs/Pleura: Lungs are clear. No pleural effusion or pneumothorax. Musculoskeletal: Thoracic and lower cervical spine degenerative changes. No evidence of bony metastatic disease. CT ABDOMEN PELVIS FINDINGS Hepatobiliary: Mild diffuse low density of the liver relative to the spleen. 3.1 cm medial segment left lobe liver cyst. Distended, normal appearing gallbladder. Pancreas: Unremarkable. No pancreatic ductal dilatation or surrounding inflammatory changes. Spleen: Normal in size without focal abnormality. Adrenals/Urinary Tract: Mild left adrenal hyperplasia. Normal appearing right adrenal gland. Small bilateral renal parapelvic cysts. Unremarkable urinary bladder and ureters. Stomach/Bowel: Small hiatal hernia. Mild descending and sigmoid colon diverticulosis. Unremarkable small bowel and appendix. Vascular/Lymphatic: Atheromatous arterial calcifications without aneurysm. No enlarged lymph nodes. Reproductive: Status post hysterectomy. No adnexal masses. Other: No abdominal wall hernia or abnormality. No abdominopelvic ascites. Musculoskeletal: Lumbar spine degenerative changes. No evidence of bony metastatic disease. IMPRESSION: 1. No evidence of malignancy in the chest, abdomen or pelvis. 2. Mild diffuse hepatic steatosis. 3. Mild descending and sigmoid colon diverticulosis. 4. Small hiatal hernia. Electronically Signed   By: Claudie Revering M.D.   On: 08/04/2020 15:52   ECHOCARDIOGRAM COMPLETE  Result Date: 08/03/2020    ECHOCARDIOGRAM REPORT   Patient Name:   Lisa [name] [facility] Date of Exam: 08/03/2020  Medical Rec #:  [id number]              Height:       61.0 in Accession #:    [id number]             Weight:       180.0 lb Date of Birth:  Nov 04, 1945               BSA:          1.806 m Patient Age:    37 years               BP:           156/83 mmHg Patient Gender: F                      HR:           85 bpm. Exam Location:  Inpatient Procedure: 2D Echo, Cardiac Doppler and Color Doppler Indications:    TIA  History:        Patient has prior history of Echocardiogram examinations, most                 recent 04/06/2019. Stroke.  Sonographer:    Cammy Brochure Referring Phys: [id number] [place]  1. Left ventricular ejection fraction, by estimation, is 55 to 60%. The left ventricle has normal function. The left ventricle has no regional wall motion abnormalities. Left ventricular diastolic parameters are consistent with Grade I diastolic dysfunction (impaired relaxation).  2. Right ventricular systolic function is normal. The right ventricular size is normal.  3. The mitral valve is normal in structure. Mild to moderate mitral valve regurgitation. No evidence of mitral stenosis.  4. The aortic valve is normal in structure. Aortic valve regurgitation is mild. No aortic stenosis is present.  5. The inferior vena cava is normal in size with greater than 50% respiratory variability, suggesting right atrial pressure of 3 mmHg. Conclusion(s)/Recommendation(s): No intracardiac  source of embolism detected on this transthoracic study. A transesophageal echocardiogram is recommended to exclude cardiac source of embolism if clinically indicated. FINDINGS  Left Ventricle: Left ventricular ejection fraction, by estimation, is 55 to 60%. The left ventricle has normal function. The left ventricle has no regional wall motion abnormalities. The left ventricular internal cavity size was normal in size. There is  no left ventricular hypertrophy. Left ventricular diastolic parameters are consistent with Grade I diastolic  dysfunction (impaired relaxation). Right Ventricle: The right ventricular size is normal. No increase in right ventricular wall thickness. Right ventricular systolic function is normal. Left Atrium: Left atrial size was normal in size. Right Atrium: Right atrial size was normal in size. Pericardium: There is no evidence of pericardial effusion. Mitral Valve: The mitral valve is normal in structure. Mild to moderate mitral valve regurgitation, with eccentric anteriorly directed jet. No evidence of mitral valve stenosis. Tricuspid Valve: The tricuspid valve is normal in structure. Tricuspid valve regurgitation is not demonstrated. No evidence of tricuspid stenosis. Aortic Valve: The aortic valve is normal in structure. Aortic valve regurgitation is mild. No aortic stenosis is present. Aortic valve mean gradient measures 4.0 mmHg. Aortic valve peak gradient measures 7.4 mmHg. Aortic valve area, by VTI measures 2.24 cm. Pulmonic Valve: The pulmonic valve was normal in structure. Pulmonic valve regurgitation is not visualized. No evidence of pulmonic stenosis. Aorta: The aortic root is normal in size and structure. Venous: The inferior vena cava is normal in size with greater than 50% respiratory variability, suggesting right atrial pressure of 3 mmHg. IAS/Shunts: No atrial level shunt detected by color flow Doppler.  LEFT VENTRICLE PLAX 2D LVIDd:         4.80 cm  Diastology LVIDs:         2.90 cm  LV e' medial:    7.83 cm/s LV PW:         1.10 cm  LV E/e' medial:  8.4 LV IVS:        0.90 cm  LV e' lateral:   8.92 cm/s LVOT diam:     1.90 cm  LV E/e' lateral: 7.4 LV SV:         58 LV SV Index:   32 LVOT Area:     2.84 cm  RIGHT VENTRICLE             IVC RV Basal diam:  3.10 cm     IVC diam: 1.10 cm RV S prime:     10.40 cm/s LEFT ATRIUM             Index       RIGHT ATRIUM           Index LA diam:        3.10 cm 1.72 cm/m  RA Area:     11.60 cm LA Vol (A2C):   32.0 ml 17.72 ml/m RA Volume:   26.40 ml  14.62 ml/m LA  Vol (A4C):   33.6 ml 18.60 ml/m LA Biplane Vol: 33.0 ml 18.27 ml/m  AORTIC VALVE AV Area (Vmax):    2.17 cm AV Area (Vmean):   2.17 cm AV Area (VTI):     2.24 cm AV Vmax:           136.00 cm/s AV Vmean:          90.500 cm/s AV VTI:            0.257 m AV Peak Grad:      7.4 mmHg  AV Mean Grad:      4.0 mmHg LVOT Vmax:         104.00 cm/s LVOT Vmean:        69.200 cm/s LVOT VTI:          0.203 m LVOT/AV VTI ratio: 0.79  AORTA Ao Root diam: 2.90 cm Ao Asc diam:  3.00 cm MITRAL VALVE MV Area (PHT): 4.89 cm     SHUNTS MV Decel Time: 155 msec     Systemic VTI:  0.20 m MV E velocity: 66.00 cm/s   Systemic Diam: 1.90 cm MV A velocity: 111.00 cm/s MV E/A ratio:  0.59 Candee Furbish MD Electronically signed by Candee Furbish MD Signature Date/Time: 08/03/2020/12:52:03 PM    Final    CUP PACEART REMOTE DEVICE CHECK  Result Date: 07/30/2020 ILR summary report received. Battery status OK. Normal device function. No new symptom, tachy, brady, or pause episodes. No new AF episodes. Monthly summary reports and ROV/PRN Kathy Breach, RN, CCDS, [employer]   Subjective: Eager to go home  Discharge Exam: Vitals:   08/04/20 1111 08/04/20 1619  BP: 136/67 (!) 130/92  Pulse: 80 79  Resp: 18 18  Temp: 98 F (36.7 C) 97.8 F (36.6 C)  SpO2: 96% 100%   Vitals:   08/04/20 0449 08/04/20 0828 08/04/20 1111 08/04/20 1619  BP: 129/78 (!) 123/53 136/67 (!) 130/92  Pulse: 85 83 80 79  Resp:  18 18 18   Temp:   98 F (36.7 C) 97.8 F (36.6 C)  TempSrc:   Oral Oral  SpO2:  95% 96% 100%  Weight:      Height:        General: Pt is alert, awake, not in acute distress Cardiovascular: RRR, S1/S2 +, no rubs, no gallops Respiratory: CTA bilaterally, no wheezing, no rhonchi Abdominal: Soft, NT, ND, bowel sounds + Extremities: no edema, no cyanosis   The results of significant diagnostics from this hospitalization (including imaging, microbiology, ancillary and laboratory) are listed below for reference.      Microbiology: Recent Results (from the past 240 hour(s))  Resp Panel by RT-PCR (Flu A&B, Covid) Nasopharyngeal Swab     Status: None   Collection Time: 08/02/20 12:07 PM   Specimen: Nasopharyngeal Swab; Nasopharyngeal(NP) swabs in vial transport medium  Result Value Ref Range Status   SARS Coronavirus 2 by RT PCR NEGATIVE NEGATIVE Final    Comment: (NOTE) SARS-CoV-2 target nucleic acids are NOT DETECTED.  The SARS-CoV-2 RNA is generally detectable in upper respiratory specimens during the acute phase of infection. The lowest concentration of SARS-CoV-2 viral copies this assay can detect is 138 copies/mL. A negative result does not preclude SARS-Cov-2 infection and should not be used as the sole basis for treatment or other patient management decisions. A negative result may occur with  improper specimen collection/handling, submission of specimen other than nasopharyngeal swab, presence of viral mutation(s) within the areas targeted by this assay, and inadequate number of viral copies(<138 copies/mL). A negative result must be combined with clinical observations, patient history, and epidemiological information. The expected result is Negative.  Fact Sheet for Patients:  EntrepreneurPulse.com.au  Fact Sheet for Healthcare Providers:  IncredibleEmployment.be  This test is no t yet approved or cleared by the [place] FDA and  has been authorized for detection and/or diagnosis of SARS-CoV-2 by FDA under an Emergency Use Authorization (EUA). This EUA will remain  in effect (meaning this test can be used) for the duration of the COVID-19  declaration under Section 564(b)(1) of the Act, 21 U.S.C.section 360bbb-3(b)(1), unless the authorization is terminated  or revoked sooner.       Influenza A by PCR NEGATIVE NEGATIVE Final   Influenza B by PCR NEGATIVE NEGATIVE Final    Comment: (NOTE) The Xpert Xpress SARS-CoV-2/FLU/RSV plus assay is  intended as an aid in the diagnosis of influenza from Nasopharyngeal swab specimens and should not be used as a sole basis for treatment. Nasal washings and aspirates are unacceptable for Xpert Xpress SARS-CoV-2/FLU/RSV testing.  Fact Sheet for Patients: EntrepreneurPulse.com.au  Fact Sheet for Healthcare Providers: IncredibleEmployment.be  This test is not yet approved or cleared by the [place] FDA and has been authorized for detection and/or diagnosis of SARS-CoV-2 by FDA under an Emergency Use Authorization (EUA). This EUA will remain in effect (meaning this test can be used) for the duration of the COVID-19 declaration under Section 564(b)(1) of the Act, 21 U.S.C. section 360bbb-3(b)(1), unless the authorization is terminated or revoked.  Performed at [facility], [address] [place], [place] [address]      Labs: BNP (last 3 results) No results for input(s): BNP in the last 8760 hours. Basic Metabolic Panel: Recent Labs  Lab 08/02/20 1038  NA 140  K 4.1  CL 102  CO2 29  GLUCOSE 115*  BUN 26*  CREATININE 0.97  CALCIUM 9.6   Liver Function Tests: Recent Labs  Lab 08/02/20 1038  AST 24  ALT 23  ALKPHOS 89  BILITOT 1.0  PROT 6.7  ALBUMIN 4.2   No results for input(s): LIPASE, AMYLASE in the last 168 hours. No results for input(s): AMMONIA in the last 168 hours. CBC: Recent Labs  Lab 08/02/20 1038  WBC 9.9  NEUTROABS 8.0*  HGB 14.4  HCT 42.8  MCV 96.8  PLT 261   Cardiac Enzymes: No results for input(s): CKTOTAL, CKMB, CKMBINDEX, TROPONINI in the last 168 hours. BNP: Invalid input(s): POCBNP CBG: No results for input(s): GLUCAP in the last 168 hours. D-Dimer No results for input(s): DDIMER in the last 72 hours. Hgb A1c Recent Labs    08/03/20 0151  HGBA1C 5.8*   Lipid Profile Recent Labs    08/03/20 0151  CHOL 150  HDL 63  LDLCALC 74  TRIG 65  CHOLHDL 2.4   Thyroid function  studies No results for input(s): TSH, T4TOTAL, T3FREE, THYROIDAB in the last 72 hours.  Invalid input(s): FREET3 Anemia work up No results for input(s): VITAMINB12, FOLATE, FERRITIN, TIBC, IRON, RETICCTPCT in the last 72 hours. Urinalysis    Component Value Date/Time   COLORURINE YELLOW 08/02/2020 1038   APPEARANCEUR CLEAR 08/02/2020 1038   LABSPEC 1.010 08/02/2020 1038   PHURINE 6.5 08/02/2020 1038   GLUCOSEU NEGATIVE 08/02/2020 1038   HGBUR NEGATIVE 08/02/2020 1038   [place] 08/02/2020 1038   [place] 08/02/2020 1038   PROTEINUR NEGATIVE 08/02/2020 1038   NITRITE NEGATIVE 08/02/2020 1038   LEUKOCYTESUR TRACE (A) 08/02/2020 1038   Sepsis Labs Invalid input(s): PROCALCITONIN,  WBC,  LACTICIDVEN Microbiology Recent Results (from the past 240 hour(s))  Resp Panel by RT-PCR (Flu A&B, Covid) Nasopharyngeal Swab     Status: None   Collection Time: 08/02/20 12:07 PM   Specimen: Nasopharyngeal Swab; Nasopharyngeal(NP) swabs in vial transport medium  Result Value Ref Range Status   SARS Coronavirus 2 by RT PCR NEGATIVE NEGATIVE Final    Comment: (NOTE) SARS-CoV-2 target nucleic acids are NOT DETECTED.  The SARS-CoV-2 RNA is generally detectable  in upper respiratory specimens during the acute phase of infection. The lowest concentration of SARS-CoV-2 viral copies this assay can detect is 138 copies/mL. A negative result does not preclude SARS-Cov-2 infection and should not be used as the sole basis for treatment or other patient management decisions. A negative result may occur with  improper specimen collection/handling, submission of specimen other than nasopharyngeal swab, presence of viral mutation(s) within the areas targeted by this assay, and inadequate number of viral copies(<138 copies/mL). A negative result must be combined with clinical observations, patient history, and epidemiological information. The expected result is Negative.  Fact Sheet  for Patients:  EntrepreneurPulse.com.au  Fact Sheet for Healthcare Providers:  IncredibleEmployment.be  This test is no t yet approved or cleared by the [place] FDA and  has been authorized for detection and/or diagnosis of SARS-CoV-2 by FDA under an Emergency Use Authorization (EUA). This EUA will remain  in effect (meaning this test can be used) for the duration of the COVID-19 declaration under Section 564(b)(1) of the Act, 21 U.S.C.section 360bbb-3(b)(1), unless the authorization is terminated  or revoked sooner.       Influenza A by PCR NEGATIVE NEGATIVE Final   Influenza B by PCR NEGATIVE NEGATIVE Final    Comment: (NOTE) The Xpert Xpress SARS-CoV-2/FLU/RSV plus assay is intended as an aid in the diagnosis of influenza from Nasopharyngeal swab specimens and should not be used as a sole basis for treatment. Nasal washings and aspirates are unacceptable for Xpert Xpress SARS-CoV-2/FLU/RSV testing.  Fact Sheet for Patients: EntrepreneurPulse.com.au  Fact Sheet for Healthcare Providers: IncredibleEmployment.be  This test is not yet approved or cleared by the [place] FDA and has been authorized for detection and/or diagnosis of SARS-CoV-2 by FDA under an Emergency Use Authorization (EUA). This EUA will remain in effect (meaning this test can be used) for the duration of the COVID-19 declaration under Section 564(b)(1) of the Act, 21 U.S.C. section 360bbb-3(b)(1), unless the authorization is terminated or revoked.  Performed at [facility], [address], [place], [place] [address]    Time spent: 30 min  SIGNED:   Marylu Lund, MD  [facility] 08/04/2020, 5:24 PM  If 7PM-7AM, please contact night-coverage

## 2020-08-22 NOTE — Progress Notes (Signed)
Carelink Summary Report / Loop Recorder 

## 2020-08-31 ENCOUNTER — Ambulatory Visit: Payer: Medicare Other

## 2020-08-31 ENCOUNTER — Other Ambulatory Visit: Payer: Self-pay

## 2020-08-31 DIAGNOSIS — R4701 Aphasia: Secondary | ICD-10-CM | POA: Insufficient documentation

## 2020-08-31 NOTE — Therapy (Signed)
[facility] [address] [place], [place], [address] Phone: [phone]   Fax:  [phone]  Patient Details  Name: Lisa [name] MRN: [id number] Date of Birth: November 22, 1945 Referring Provider:  Cari Caraway, MD  Encounter Date: 08/31/2020    ST - Arrive/Cancel  When it was learned pt lives 5 minutes from the Neurorehabilitation office in [place] ([facility]), SLP gave pt and husband the choice to initiate eval here and then continue at [facility], or to go to [facility] for eval and ST and they chose to go to [facility] for eval and ST.  [facility] will be contacted and asked to contact pt to schedule eval.   Halia Franey. ,MS, CCC-SLP  08/31/2020, 12:29 PM  [facility] [address] [place], [place], [address] Phone: [phone]   Fax:  [phone]

## 2020-09-03 ENCOUNTER — Telehealth: Payer: Self-pay | Admitting: Adult Health

## 2020-09-03 ENCOUNTER — Ambulatory Visit (INDEPENDENT_AMBULATORY_CARE_PROVIDER_SITE_OTHER): Payer: Medicare Other

## 2020-09-03 DIAGNOSIS — I639 Cerebral infarction, unspecified: Secondary | ICD-10-CM | POA: Diagnosis not present

## 2020-09-03 NOTE — Telephone Encounter (Signed)
Lisa [name] said per referral notes, referrals have been trying to contact to schedule with Dr. Leonie Man - i would forward call to [place]

## 2020-09-03 NOTE — Telephone Encounter (Signed)
Pt's husband Maricela Bo called wanting to schedule his wife an appt. Says she had a stroke recently. Husband is requesting a call back.

## 2020-09-04 ENCOUNTER — Other Ambulatory Visit: Payer: Self-pay

## 2020-09-04 ENCOUNTER — Ambulatory Visit: Payer: Medicare Other | Attending: Family Medicine | Admitting: Speech Pathology

## 2020-09-04 ENCOUNTER — Encounter: Payer: Self-pay | Admitting: Speech Pathology

## 2020-09-04 ENCOUNTER — Ambulatory Visit: Payer: Medicare Other | Admitting: Speech Pathology

## 2020-09-04 DIAGNOSIS — R4701 Aphasia: Secondary | ICD-10-CM

## 2020-09-04 LAB — CUP PACEART REMOTE DEVICE CHECK
Date Time Interrogation Session: 20220807230521
Implantable Pulse Generator Implant Date: 20210318

## 2020-09-04 NOTE — Therapy (Signed)
[place]. [place], [place], [address] Phone: [phone]   Fax:  [phone]  Speech Language Pathology Evaluation  Patient Details  Name: Lisa [name] MRN: [id number] Date of Birth: 05-Jun-1945 Referring Provider (SLP): Cari Caraway MD   Encounter Date: 09/04/2020   End of Session - 09/04/20 1342     Visit Number 1    Number of Visits 17    Date for SLP Re-Evaluation 11/04/20    SLP Start Time 1100    SLP Stop Time  [id number]    SLP Time Calculation (min) 45 min    Activity Tolerance Patient tolerated treatment well             Past Medical History:  Diagnosis Date   Stroke ([place]) 04/06/2019   Uterine cancer ([place]) 01/25/2016    Past Surgical History:  Procedure Laterality Date   BREAST EXCISIONAL BIOPSY Left 1975   LOOP RECORDER INSERTION N/A 04/07/2019   Procedure: LOOP RECORDER INSERTION;  Surgeon: Evans Lance, MD;  Location: [facility];  Service: Cardiovascular;  Laterality: N/A;   TUBAL LIGATION  1970    There were no vitals filed for this visit.   Subjective Assessment - 09/04/20 1340     Subjective Pt was pleasant and cooperative throughout today's assessment.    Patient is accompained by: Family member   Rinehard - husband   Currently in Pain? 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  Of note, patient had CVA with left-sided weakness.  At that time, MRI showed multi foci infarct  with an bilateral coronary radiata.  She underwent Holter monitoring that did not show arrhythmia.  She has been on aspirin and a statin with good compliance.      Balance Screen   Has the patient fallen in the past 6 months No      Prior Functional Status   Cognitive/Linguistic Baseline Within functional limits    Type of Home House     Lives With Spouse    Available Support Family;Friend(s)    Education HS    Vocation Retired      Associate Professor   Overall Cognitive Status Difficult to assess    Difficult to assess due to Impaired communication      Auditory Comprehension   Overall Auditory Comprehension Appears within functional limits for tasks assessed      Verbal Expression   Overall Verbal Expression Impaired    Level of Generative/Spontaneous Verbalization Conversation    Repetition No impairment    Naming Impairment    Confrontation 75-100% accurate    Divergent 75-100% accurate    Verbal Errors Semantic paraphasias;Aware of errors    Effective Techniques Semantic cues;Sentence completion;Written cues      Written Expression   Dominant Hand Right    Written Expression Exceptions to  [facility]    Self Formulation Ability Sentence      Oral Motor/Sensory Function   Overall Oral Motor/Sensory Function Appears within functional limits for tasks assessed      Motor Speech   Overall Motor Speech Appears within functional limits for tasks assessed      Standardized Assessments   Standardized Assessments  Other Assessment    Other Assessment WAB-B, Informal language             Western Aphasia Battery - Bedside Record Form   Spontaneous Speech - Content: 8/10 Spontaneous Speech- Fluency: 6/10 Auditory Verbal Comprehension- Yes/No Questions: 9/10 Sequential Commands: 10/10  Repetition: 10/10 Object naming: 9/10 Reading: 8/10 Writing: 7/10 Apraxia (Optional): NA                 SLP Education - 09/04/20 1341     Education Details Provided edu on aphasia  - expressive vs. receptive language    Person(s) Educated Patient;Spouse    Methods Explanation;Demonstration;Handout    Comprehension Verbalized understanding;Need further instruction              SLP Short Term Goals - 09/04/20 1520       SLP SHORT TERM GOAL #1   Title Pt will provide +3 categorical descriptions (SFA) of single item given minA.    Time 4    Period Weeks    Status New    Target Date 10/05/20      SLP SHORT TERM GOAL #2   Title Pt will recall 3 word finding strategies to use in cases of anomia independently.    Time 4    Period Weeks    Status New    Target Date 10/05/20              SLP Long Term Goals - 09/04/20 1514       SLP LONG TERM GOAL #1   Title Pt and family will report successful use of word finding strategies to decrease anomia in conversation.    Time 8    Period Weeks    Status New    Target Date 11/04/20      SLP LONG TERM GOAL #2   Title Pt will have demonstrate <2 instances of anomia in a 20 minute conversation with SLP.    Time 8    Period Weeks    Status New    Target Date 11/04/20              Plan - 09/04/20 1459     Clinical Impression Statement Pt is a 75 yo female who presents to OP therapy for evaluation today post CVA on 08/02/20. Pt has no hx of previous speech therapy needs. Upon informal evaluation, pt presented with relatively intact auditory comprehension, disorganized discourse patterns, and word-finding difficulties in conversion. Pt was assessed using WAB-B. Pt demonstrated relatively intact auditory comprehension for direction fllowing and yes/no questions and reading abilities. Pt exhibited normal syntax, few paraphasias, and significant word finding difficulty. Pt was able to repeat words and sentences in increasing lengths with no difficulty. Pt demonstrated mild word finding at the word level; however, given increased time, pt was able to name objects independently. Most diffiuclty noted in conversation.  Pt noted to omit functors (in, as, the, etc.) in speech and written communication. See note for WAB-B scores. Pt and husband reported no difficulties with memory or other cognitive processes. Cognition diffiult to screen at this time due to expressive language deficits. To monitor. SLP rec  skilled speech services to address expressive language to increase pt's ability to communicate wants/needs effectively and efficiently.    Speech Therapy Frequency 2x / week    Duration 8 weeks    Treatment/Interventions Compensatory strategies;Cueing hierarchy;Functional tasks;Patient/family education;Environmental controls;Multimodal communcation approach;Language facilitation;Compensatory techniques;Internal/external aids;SLP instruction and feedback    Potential to Achieve Goals Good    Consulted and Agree with Plan of Care Patient;Family member/caregiver    Family Member Consulted Husband             Patient will benefit from skilled therapeutic intervention in order to improve the following deficits and impairments:   Aphasia    Problem List Patient Active Problem List   Diagnosis Date Noted   Amnesia 08/02/2020   Acute CVA (cerebrovascular accident) ([place]) 04/07/2019   CVA (cerebral vascular accident) ([place]) 04/05/2019   TIA (transient ischemic attack) 04/05/2019   Endometrial cancer ([place]) 04/22/2017   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 [place], [place], [address] Phone: [phone]   Fax:  [phone]  Name: Lisa [name] MRN: [id number] Date of Birth: 10-15-1945

## 2020-09-11 ENCOUNTER — Ambulatory Visit: Payer: Medicare Other | Admitting: Speech Pathology

## 2020-09-11 ENCOUNTER — Other Ambulatory Visit: Payer: Self-pay

## 2020-09-11 ENCOUNTER — Encounter: Payer: Self-pay | Admitting: Speech Pathology

## 2020-09-11 DIAGNOSIS — R4701 Aphasia: Secondary | ICD-10-CM | POA: Diagnosis not present

## 2020-09-11 NOTE — Therapy (Signed)
[facility]. [place], [place], [address] Phone: [phone]   Fax:  [phone]  Speech Language Pathology Treatment  Patient Details  Name: Lisa [name] MRN: [id number] Date of Birth: March 28, 1945 Referring Provider (SLP): Cari Caraway MD   Encounter Date: 09/11/2020   End of Session - 09/11/20 0934     Visit Number 2    Number of Visits 17    Date for SLP Re-Evaluation 11/04/20    SLP Start Time 0930    SLP Stop Time  [id number]    SLP Time Calculation (min) 45 min    Activity Tolerance Patient tolerated treatment well             Past Medical History:  Diagnosis Date   Stroke ([place]) 04/06/2019   Uterine cancer ([place]) 01/25/2016    Past Surgical History:  Procedure Laterality Date   BREAST EXCISIONAL BIOPSY Left 1975   LOOP RECORDER INSERTION N/A 04/07/2019   Procedure: LOOP RECORDER INSERTION;  Surgeon: Evans Lance, MD;  Location: [facility];  Service: Cardiovascular;  Laterality: N/A;   TUBAL LIGATION  1970    There were no vitals filed for this visit.   Subjective Assessment - 09/11/20 0932     Subjective Pt reports that her husband reports positive changes from last week.    Currently in Pain? No/denies                   ADULT SLP TREATMENT - 09/11/20 1321       General Information   Behavior/Cognition Pleasant mood;Cooperative;Requires cueing      Treatment Provided   Treatment provided Cognitive-Linquistic      Cognitive-Linquistic Treatment   Treatment focused on Aphasia    Skilled Treatment SLP edu re: word finding strategies. Pt and husband were able to demonstrate use and provide examples. Pt reported negative feelings associated with anomia. SLP reassured pt and provided information for common communication partners. To cont EET next session for SFA.      Assessment / Recommendations / Plan   Plan Continue with current plan of care      Progression  Toward Goals   Progression toward goals Progressing toward goals                SLP Short Term Goals - 09/04/20 1520       SLP SHORT TERM GOAL #1   Title Pt will provide +3 categorical descriptions (SFA) of single item given minA.    Time 4    Period Weeks    Status New    Target Date 10/05/20      SLP SHORT TERM GOAL #2   Title Pt will recall 3 word finding strategies to use in cases of anomia independently.    Time 4    Period Weeks    Status New    Target Date 10/05/20              SLP Long Term Goals - 09/04/20 1514       SLP LONG TERM GOAL #1   Title Pt and family will report successful use of word finding strategies to decrease anomia in conversation.    Time 8    Period Weeks    Status New    Target Date 11/04/20      SLP LONG TERM GOAL #2   Title Pt will have demonstrate <2 instances of anomia in a 20 minute conversation with SLP.  Time 8    Period Weeks    Status New    Target Date 11/04/20              Plan - 09/11/20 1320     Clinical Impression Statement See tx note. Pt was responsive to learning strategies. Pt husband was receptive. Continue with current POC.    Speech Therapy Frequency 2x / week    Duration 8 weeks    Treatment/Interventions Compensatory strategies;Cueing hierarchy;Functional tasks;Patient/family education;Environmental controls;Multimodal communcation approach;Language facilitation;Compensatory techniques;Internal/external aids;SLP instruction and feedback    Potential to Achieve Goals Good    Consulted and Agree with Plan of Care Patient;Family member/caregiver    Family Member Consulted Husband             Patient will benefit from skilled therapeutic intervention in order to improve the following deficits and impairments:   Aphasia    Problem List Patient Active Problem List   Diagnosis Date Noted   Amnesia 08/02/2020   Acute CVA (cerebrovascular accident) ([place]) 04/07/2019   CVA (cerebral vascular  accident) ([facility] [place]) 04/05/2019   TIA (transient ischemic attack) 04/05/2019   Endometrial cancer ([place]) 04/22/2017   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 [place], [place], [address] Phone: [phone]   Fax:  [phone]   Name: Lisa [name] MRN: [id number] Date of Birth: 02-Jul-1945

## 2020-09-12 ENCOUNTER — Encounter: Payer: Self-pay | Admitting: Speech Pathology

## 2020-09-12 ENCOUNTER — Ambulatory Visit: Payer: Medicare Other | Admitting: Speech Pathology

## 2020-09-12 DIAGNOSIS — R4701 Aphasia: Secondary | ICD-10-CM

## 2020-09-12 NOTE — Therapy (Signed)
[facility]. [place], [place], [address] Phone: [phone]   Fax:  [phone]  Speech Language Pathology Treatment  Patient Details  Name: Lisa [name] MRN: [id number] Date of Birth: 1945/04/25 Referring Provider (SLP): Cari Caraway MD   Encounter Date: 09/12/2020   End of Session - 09/12/20 1051     Visit Number 3    Number of Visits 17    Date for SLP Re-Evaluation 11/04/20    SLP Start Time 0925    SLP Stop Time  1010    SLP Time Calculation (min) 45 min    Activity Tolerance Patient tolerated treatment well             Past Medical History:  Diagnosis Date   Stroke ([place]) 04/06/2019   Uterine cancer ([place]) 01/25/2016    Past Surgical History:  Procedure Laterality Date   BREAST EXCISIONAL BIOPSY Left 1975   LOOP RECORDER INSERTION N/A 04/07/2019   Procedure: LOOP RECORDER INSERTION;  Surgeon: Evans Lance, MD;  Location: [facility];  Service: Cardiovascular;  Laterality: N/A;   TUBAL LIGATION  1970    There were no vitals filed for this visit.   Subjective Assessment - 09/12/20 1036     Subjective Pt reports positivity when using strategies outside of therapy setting.    Patient is accompained by: Family member   Rinehard - husband   Currently in Pain? No/denies                   ADULT SLP TREATMENT - 09/12/20 1038       General Information   Behavior/Cognition Pleasant mood;Cooperative;Requires cueing      Treatment Provided   Treatment provided Cognitive-Linquistic      Cognitive-Linquistic Treatment   Treatment focused on Aphasia    Skilled Treatment SLP facilitated expressive language through semantic feature analysis using an expanding expression tool. Pt required modA to complete this task. Following tx, Pt reported the task to be "too easy". To increase awareness, SLP had pt attempt task independently with written prompts via white board to  demonstrate that she continues to require add'l assist.      Assessment / Recommendations / Plan   Plan Continue with current plan of care      Progression Toward Goals   Progression toward goals Progressing toward goals                SLP Short Term Goals - 09/12/20 1054       SLP SHORT TERM GOAL #1   Title Pt will provide +3 categorical descriptions (SFA) of single item given minA.    Time 4    Period Weeks    Status On-going    Target Date 10/05/20      SLP SHORT TERM GOAL #2   Title Pt will recall 3 word finding strategies to use in cases of anomia independently.    Time 4    Period Weeks    Status On-going    Target Date 10/05/20              SLP Long Term Goals - 09/12/20 1054       SLP LONG TERM GOAL #1   Title Pt and family will report successful use of word finding strategies to decrease anomia in conversation.    Time 8    Period Weeks    Status On-going      SLP LONG TERM GOAL #  2   Title Pt will have demonstrate <2 instances of anomia in a 20 minute conversation with SLP.    Time 8    Period Weeks    Status On-going              Plan - 09/12/20 1052     Clinical Impression Statement See tx note. Pt demonstrated decreased awareness of deficits during tx session. SLP explained that while she may feel task is too easy, she cont to require min to modA. Continue with current POC.    Speech Therapy Frequency 2x / week    Duration 8 weeks    Treatment/Interventions Compensatory strategies;Cueing hierarchy;Functional tasks;Patient/family education;Environmental controls;Multimodal communcation approach;Language facilitation;Compensatory techniques;Internal/external aids;SLP instruction and feedback    Potential to Achieve Goals Good    Consulted and Agree with Plan of Care Patient;Family member/caregiver    Family Member Consulted Husband             Patient will benefit from skilled therapeutic intervention in order to improve the  following deficits and impairments:   Aphasia    Problem List Patient Active Problem List   Diagnosis Date Noted   Amnesia 08/02/2020   Acute CVA (cerebrovascular accident) ([place]) 04/07/2019   CVA (cerebral vascular accident) ([place]) 04/05/2019   TIA (transient ischemic attack) 04/05/2019   Endometrial cancer ([place]) 04/22/2017   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 Communication Sciences and Disorders  09/12/2020, 10:59 AM  [facility]. [place], [place], [address] Phone: [phone]   Fax:  [phone]   Name: Lisa [name] MRN: [id number] Date of Birth: 09-28-1945

## 2020-09-18 ENCOUNTER — Encounter: Payer: Self-pay | Admitting: Speech Pathology

## 2020-09-18 ENCOUNTER — Other Ambulatory Visit: Payer: Self-pay

## 2020-09-18 ENCOUNTER — Ambulatory Visit: Payer: Medicare Other | Admitting: Speech Pathology

## 2020-09-18 DIAGNOSIS — R4701 Aphasia: Secondary | ICD-10-CM | POA: Diagnosis not present

## 2020-09-18 NOTE — Therapy (Addendum)
[place] [place], [place], [address] Phone: [phone]   Fax:  [phone]  Speech Language Pathology Treatment  Patient Details  Name: Lisa [name] MRN: [id number] Date of Birth: 01/17/1946 Referring Provider (SLP): Cari Caraway MD   Encounter Date: 09/18/2020   End of Session - 09/18/20 1039     Visit Number 4    Number of Visits 17    Date for SLP Re-Evaluation 11/04/20    SLP Start Time 0930    SLP Stop Time  [id number]    SLP Time Calculation (min) 45 min    Activity Tolerance Patient tolerated treatment well             Past Medical History:  Diagnosis Date   Stroke ([place]) 04/06/2019   Uterine cancer ([place]) 01/25/2016    Past Surgical History:  Procedure Laterality Date   BREAST EXCISIONAL BIOPSY Left 1975   LOOP RECORDER INSERTION N/A 04/07/2019   Procedure: LOOP RECORDER INSERTION;  Surgeon: Evans Lance, MD;  Location: [facility];  Service: Cardiovascular;  Laterality: N/A;   TUBAL LIGATION  1970    There were no vitals filed for this visit.   Subjective Assessment - 09/18/20 0937     Subjective Pt reports that she used strategies with friends.    Patient is accompained by: Family member   Lisa [name] - husband   Currently in Pain? No/denies                   ADULT SLP TREATMENT - 09/18/20 0001       General Information   Behavior/Cognition Pleasant mood;Cooperative;Requires cueing      Treatment Provided   Treatment provided Cognitive-Linquistic      Cognitive-Linquistic Treatment   Treatment focused on Aphasia    Skilled Treatment Pt participated in semantic feature analysis using an expanding expression tool. Pt required mod multimodal cues to complete this task. Pt also described objects by their features using the EET to SLP and husband requiring modA. Pt spontaneously used gesturing to facilitate language output. Educate pt and husband about  communication strategies for aud comp next visit.      Assessment / Recommendations / Plan   Plan Continue with current plan of care      Progression Toward Goals   Progression toward goals Progressing toward goals                SLP Short Term Goals - 09/18/20 1043       SLP SHORT TERM GOAL #1   Title Pt will provide +3 categorical descriptions (SFA) of single item given minA.    Time 4    Period Weeks    Status On-going    Target Date 10/05/20      SLP SHORT TERM GOAL #2   Title Pt will recall 3 word finding strategies to use in cases of anomia independently.    Time 4    Period Weeks    Status On-going    Target Date 10/05/20      SLP SHORT TERM GOAL #3   Title Pt and husband will recall 3 communication strategies to aid auditory comprehension.    Time 4    Period Weeks    Status On-going    Target Date 10/05/20              SLP Long Term Goals - 09/18/20 1044       SLP LONG  TERM GOAL #1   Title Pt and family will report successful use of word finding strategies to decrease anomia in conversation.    Time 8    Period Weeks    Status On-going    Target Date 11/04/20      SLP LONG TERM GOAL #2   Title Pt will have demonstrate <2 instances of anomia in a 20 minute conversation with SLP.    Time 8    Period Weeks    Status On-going    Target Date 11/04/20      SLP LONG TERM GOAL #3   Title Pt and husband will demonstrate use of communication strategies to reduce frustration and communication breakdowns.    Time 8    Period Weeks    Status On-going    Target Date 11/04/20              Plan - 09/18/20 1041     Clinical Impression Statement See tx note. SLP to update goals to reflect deficits in auditory comprehension. Pt and husband report communication breakdowns, SLP to provide strategies to address. Continue with current POC.    Speech Therapy Frequency 2x / week    Duration 8 weeks    Treatment/Interventions Compensatory  strategies;Cueing hierarchy;Functional tasks;Patient/family education;Environmental controls;Multimodal communcation approach;Language facilitation;Compensatory techniques;Internal/external aids;SLP instruction and feedback    Potential to Achieve Goals Good    Consulted and Agree with Plan of Care Patient;Family member/caregiver    Family Member Consulted Husband             Patient will benefit from skilled therapeutic intervention in order to improve the following deficits and impairments:   Expressive aphasia    Problem List Patient Active Problem List   Diagnosis Date Noted   Amnesia 08/02/2020   Acute CVA (cerebrovascular accident) ([place]) 04/07/2019   CVA (cerebral vascular accident) ([place]) 04/05/2019   TIA (transient ischemic attack) 04/05/2019   Endometrial cancer ([place]) 04/22/2017   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 [place], [place], [address] Phone: [phone]   Fax:  [phone]   Name: Lisa [name] MRN: [id number] Date of Birth: March 29, 1945

## 2020-09-20 ENCOUNTER — Ambulatory Visit: Payer: Medicare Other | Attending: Family Medicine | Admitting: Speech Pathology

## 2020-09-20 ENCOUNTER — Other Ambulatory Visit: Payer: Self-pay

## 2020-09-20 ENCOUNTER — Encounter: Payer: Self-pay | Admitting: Speech Pathology

## 2020-09-20 DIAGNOSIS — R4701 Aphasia: Secondary | ICD-10-CM | POA: Diagnosis not present

## 2020-09-20 NOTE — Therapy (Signed)
[place]. [place], [place], [address] Phone: [phone]   Fax:  [phone]  Speech Language Pathology Treatment  Patient Details  Name: Lisa [name] MRN: [id number] Date of Birth: [birth year] Referring Provider (SLP): Cari Caraway MD   Encounter Date: 09/20/2020   End of Session - 09/20/20 0932     Visit Number 5    Number of Visits 17    Date for SLP Re-Evaluation 11/04/20    SLP Start Time 0930    SLP Stop Time  1010    SLP Time Calculation (min) 40 min    Activity Tolerance Patient tolerated treatment well             Past Medical History:  Diagnosis Date   Stroke ([facility]) 04/06/2019   Uterine cancer ([place]) 01/25/2016    Past Surgical History:  Procedure Laterality Date   BREAST EXCISIONAL BIOPSY Left 1975   LOOP RECORDER INSERTION N/A 04/07/2019   Procedure: LOOP RECORDER INSERTION;  Surgeon: Evans Lance, MD;  Location: [facility];  Service: Cardiovascular;  Laterality: N/A;   TUBAL LIGATION  1970    There were no vitals filed for this visit.   Subjective Assessment - 09/20/20 0932     Subjective Pt reports, "I am okay."    Patient is accompained by: Family member    Currently in Pain? No/denies                   ADULT SLP TREATMENT - 09/20/20 0001       General Information   Behavior/Cognition Pleasant mood;Cooperative      Treatment Provided   Treatment provided Cognitive-Linquistic      Cognitive-Linquistic Treatment   Treatment focused on Aphasia    Skilled Treatment SLP trained pt and husband on communication strategies regarding aphasia. Pt was receptive to strategies and provided examples where she could use them in daily life. Husband reports that he facilitates conversation by adjusting the environment as needed (muting TV, etc.) Pt reports positive interactions regarding communication with family and friends. She reports that a close friend has  gone through word finding problems post CVA and feels supported by her.      Assessment / Recommendations / Plan   Plan Continue with current plan of care      Progression Toward Goals   Progression toward goals Progressing toward goals                SLP Short Term Goals - 09/18/20 1043       SLP SHORT TERM GOAL #1   Title Pt will provide +3 categorical descriptions (SFA) of single item given minA.    Time 4    Period Weeks    Status On-going    Target Date 10/05/20      SLP SHORT TERM GOAL #2   Title Pt will recall 3 word finding strategies to use in cases of anomia independently.    Time 4    Period Weeks    Status On-going    Target Date 10/05/20      SLP SHORT TERM GOAL #3   Title Pt and husband will recall 3 communication strategies to aid auditory comprehension.    Time 4    Period Weeks    Status On-going    Target Date 10/05/20              SLP Long Term Goals - 09/18/20 1044  SLP LONG TERM GOAL #1   Title Pt and family will report successful use of word finding strategies to decrease anomia in conversation.    Time 8    Period Weeks    Status On-going    Target Date 11/04/20      SLP LONG TERM GOAL #2   Title Pt will have demonstrate <2 instances of anomia in a 20 minute conversation with SLP.    Time 8    Period Weeks    Status On-going    Target Date 11/04/20      SLP LONG TERM GOAL #3   Title Pt and husband will demonstrate use of communication strategies to reduce frustration and communication breakdowns.    Time 8    Period Weeks    Status On-going    Target Date 11/04/20              Plan - 09/20/20 0936     Clinical Impression Statement See tx note. Pt and husband were receptive to learning communication strategies to assist in preventing communication breakdown. Continue with current POC.    Speech Therapy Frequency 2x / week    Duration 8 weeks    Treatment/Interventions Compensatory strategies;Cueing  hierarchy;Functional tasks;Patient/family education;Environmental controls;Multimodal communcation approach;Language facilitation;Compensatory techniques;Internal/external aids;SLP instruction and feedback    Potential to Achieve Goals Good    Consulted and Agree with Plan of Care Patient;Family member/caregiver    Family Member Consulted Husband             Patient will benefit from skilled therapeutic intervention in order to improve the following deficits and impairments:   Aphasia    Problem List Patient Active Problem List   Diagnosis Date Noted   Amnesia 08/02/2020   Acute CVA (cerebrovascular accident) ([place]) 04/07/2019   CVA (cerebral vascular accident) ([place]) 04/05/2019   TIA (transient ischemic attack) 04/05/2019   Endometrial cancer ([place]) 04/22/2017   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 Communication Sciences and Disorders  09/20/2020, 12:11 PM  [address] [place]. [place], [place], [address] Phone: [phone]   Fax:  [phone]   Name: Lisa [name] MRN: [id number] Date of Birth: 1945-09-29

## 2020-09-22 NOTE — Progress Notes (Signed)
Carelink Summary Report / Loop Recorder 

## 2020-09-25 ENCOUNTER — Other Ambulatory Visit: Payer: Self-pay

## 2020-09-25 ENCOUNTER — Encounter: Payer: Self-pay | Admitting: Speech Pathology

## 2020-09-25 ENCOUNTER — Ambulatory Visit: Payer: Medicare Other | Admitting: Speech Pathology

## 2020-09-25 DIAGNOSIS — R4701 Aphasia: Secondary | ICD-10-CM

## 2020-09-25 NOTE — Therapy (Signed)
[place]. [place], [place], [address] Phone: [phone]   Fax:  [phone]  Speech Language Pathology Treatment  Patient Details  Name: Lisa [name] MRN: [id number] Date of Birth: 02-Jan-1946 Referring Provider (SLP): Cari Caraway MD   Encounter Date: 09/25/2020   End of Session - 09/25/20 1107     Visit Number 6    Number of Visits 17    Date for SLP Re-Evaluation 11/04/20    SLP Start Time [id number]    SLP Stop Time  1142    SLP Time Calculation (min) 40 min    Activity Tolerance Patient tolerated treatment well             Past Medical History:  Diagnosis Date   Stroke ([place]) 04/06/2019   Uterine cancer ([place]) 01/25/2016    Past Surgical History:  Procedure Laterality Date   BREAST EXCISIONAL BIOPSY Left 1975   LOOP RECORDER INSERTION N/A 04/07/2019   Procedure: LOOP RECORDER INSERTION;  Surgeon: Evans Lance, MD;  Location: [facility];  Service: Cardiovascular;  Laterality: N/A;   TUBAL LIGATION  1970    There were no vitals filed for this visit.   Subjective Assessment - 09/25/20 1105     Subjective Pt reports she feels like she is speaking better.    Patient is accompained by: Family member    Currently in Pain? No/denies                   ADULT SLP TREATMENT - 09/25/20 1227       General Information   Behavior/Cognition Alert;Cooperative;Pleasant mood      Treatment Provided   Treatment provided Cognitive-Linquistic      Cognitive-Linquistic Treatment   Treatment focused on Aphasia    Skilled Treatment SLP completed training on communication strategies regarding aphasia with pt and husband. Pt reports positive interactions with others and no/decreased difficulty with comprehension. Pt and husband were instructed to identify 2-3 strategies they could both implement outside of the tx room. To choose 1 strategy for HEP to trial.      Assessment /  Recommendations / Plan   Plan Continue with current plan of care      Progression Toward Goals   Progression toward goals Progressing toward goals                SLP Short Term Goals - 09/18/20 1043       SLP SHORT TERM GOAL #1   Title Pt will provide +3 categorical descriptions (SFA) of single item given minA.    Time 4    Period Weeks    Status On-going    Target Date 10/05/20      SLP SHORT TERM GOAL #2   Title Pt will recall 3 word finding strategies to use in cases of anomia independently.    Time 4    Period Weeks    Status On-going    Target Date 10/05/20      SLP SHORT TERM GOAL #3   Title Pt and husband will recall 3 communication strategies to aid auditory comprehension.    Time 4    Period Weeks    Status On-going    Target Date 10/05/20              SLP Long Term Goals - 09/18/20 1044       SLP LONG TERM GOAL #1   Title Pt and family will report  successful use of word finding strategies to decrease anomia in conversation.    Time 8    Period Weeks    Status On-going    Target Date 11/04/20      SLP LONG TERM GOAL #2   Title Pt will have demonstrate <2 instances of anomia in a 20 minute conversation with SLP.    Time 8    Period Weeks    Status On-going    Target Date 11/04/20      SLP LONG TERM GOAL #3   Title Pt and husband will demonstrate use of communication strategies to reduce frustration and communication breakdowns.    Time 8    Period Weeks    Status On-going    Target Date 11/04/20              Plan - 09/25/20 1237     Clinical Impression Statement See tx note. Pt and husband report progress with word finding in conversation outside of therapy. Pt reports giving herself time to communicate with others as needed. Continue with current POC.    Speech Therapy Frequency 2x / week    Duration 8 weeks    Treatment/Interventions Compensatory strategies;Cueing hierarchy;Functional tasks;Patient/family education;Environmental  controls;Multimodal communcation approach;Language facilitation;Compensatory techniques;Internal/external aids;SLP instruction and feedback    Potential to Achieve Goals Good    Consulted and Agree with Plan of Care Patient;Family member/caregiver    Family Member Consulted Husband             Patient will benefit from skilled therapeutic intervention in order to improve the following deficits and impairments:   Aphasia    Problem List Patient Active Problem List   Diagnosis Date Noted   Amnesia 08/02/2020   Acute CVA (cerebrovascular accident) ([place]) 04/07/2019   CVA (cerebral vascular accident) ([facility]) 04/05/2019   TIA (transient ischemic attack) 04/05/2019   Endometrial cancer ([facility]) 04/22/2017   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 Communication Sciences and Disorders  09/25/2020, 1:05 PM  [place]. [place], [place], [address] Phone: [phone]   Fax:  [phone]   Name: Lisa [name] MRN: [id number] Date of Birth: Jan 20, 1946

## 2020-09-27 ENCOUNTER — Ambulatory Visit: Payer: Medicare Other | Admitting: Speech Pathology

## 2020-09-27 ENCOUNTER — Encounter: Payer: Self-pay | Admitting: Speech Pathology

## 2020-09-27 ENCOUNTER — Other Ambulatory Visit: Payer: Self-pay

## 2020-09-27 DIAGNOSIS — R4701 Aphasia: Secondary | ICD-10-CM | POA: Diagnosis not present

## 2020-09-27 NOTE — Therapy (Signed)
[place]. [place], [place], [address] Phone: [phone]   Fax:  [phone]  Speech Language Pathology Treatment  Patient Details  Name: Lisa [name] MRN: [id number] Date of Birth: 06-12-1945 Referring Provider (SLP): Cari Caraway MD   Encounter Date: 09/27/2020   End of Session - 09/27/20 1021     Visit Number 7    Number of Visits 17    Date for SLP Re-Evaluation 11/04/20    SLP Start Time 0930    SLP Stop Time  1010    SLP Time Calculation (min) 40 min    Activity Tolerance Patient tolerated treatment well             Past Medical History:  Diagnosis Date   Stroke ([place]) 04/06/2019   Uterine cancer ([facility]) 01/25/2016    Past Surgical History:  Procedure Laterality Date   BREAST EXCISIONAL BIOPSY Left 1975   LOOP RECORDER INSERTION N/A 04/07/2019   Procedure: LOOP RECORDER INSERTION;  Surgeon: Evans Lance, MD;  Location: [facility];  Service: Cardiovascular;  Laterality: N/A;   TUBAL LIGATION  1970    There were no vitals filed for this visit.   Subjective Assessment - 09/27/20 1020     Subjective Pt shared about her experience going to eat with friends yesterday.    Patient is accompained by: Family member    Currently in Pain? No/denies                   ADULT SLP TREATMENT - 09/27/20 1037       General Information   Behavior/Cognition Alert;Cooperative      Treatment Provided   Treatment provided Cognitive-Linquistic      Cognitive-Linquistic Treatment   Treatment focused on Aphasia    Skilled Treatment SLP reviewed HEP with pt. Pt and husband reported use of communication strategies at home. Pt participated in RET training targeting language expansion for expressive aphasia. Pt was instructed to view an image and generate one sentence regarding the image. SLP used [facility] questions (where, what, who, why, how) to expand pt utterances. Pt required modA to  complete task. Prior to tx, pt used an average of 3.1 content words and through RET was able to expand to 4.2 content words. SLP encouraged pt and husband to implement [facility] questions in conversation to faciliate in language expansion.      Assessment / Recommendations / Plan   Plan Continue with current plan of care      Progression Toward Goals   Progression toward goals Progressing toward goals                SLP Short Term Goals - 09/18/20 1043       SLP SHORT TERM GOAL #1   Title Pt will provide +3 categorical descriptions (SFA) of single item given [name].    Time 4    Period Weeks    Status On-going    Target Date 10/05/20      SLP SHORT TERM GOAL #2   Title Pt will recall 3 word finding strategies to use in cases of anomia independently.    Time 4    Period Weeks    Status On-going    Target Date 10/05/20      SLP SHORT TERM GOAL #3   Title Pt and husband will recall 3 communication strategies to aid auditory comprehension.    Time 4    Period Weeks  Status On-going    Target Date 10/05/20              SLP Long Term Goals - 09/18/20 1044       SLP LONG TERM GOAL #1   Title Pt and family will report successful use of word finding strategies to decrease anomia in conversation.    Time 8    Period Weeks    Status On-going    Target Date 11/04/20      SLP LONG TERM GOAL #2   Title Pt will have demonstrate <2 instances of anomia in a 20 minute conversation with SLP.    Time 8    Period Weeks    Status On-going    Target Date 11/04/20      SLP LONG TERM GOAL #3   Title Pt and husband will demonstrate use of communication strategies to reduce frustration and communication breakdowns.    Time 8    Period Weeks    Status On-going    Target Date 11/04/20              Plan - 09/27/20 1021     Clinical Impression Statement See tx note. Husband reports that pt is more receptive to communication/conversations when using communication strategies  (patience, extra time etc). Continue with current POC.    Speech Therapy Frequency 2x / week    Duration 8 weeks    Treatment/Interventions Compensatory strategies;Cueing hierarchy;Functional tasks;Patient/family education;Environmental controls;Multimodal communcation approach;Language facilitation;Compensatory techniques;Internal/external aids;SLP instruction and feedback    Potential to Achieve Goals Good    Consulted and Agree with Plan of Care Patient;Family member/caregiver    Family Member Consulted Husband             Patient will benefit from skilled therapeutic intervention in order to improve the following deficits and impairments:   Aphasia    Problem List Patient Active Problem List   Diagnosis Date Noted   Amnesia 08/02/2020   Acute CVA (cerebrovascular accident) ([facility]) 04/07/2019   CVA (cerebral vascular accident) ([place]) 04/05/2019   TIA (transient ischemic attack) 04/05/2019   Endometrial cancer ([place]) 04/22/2017   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 [place], [place], [address] Phone: [phone]   Fax:  [phone]   Name: Lisa [name] MRN: [id number] Date of Birth: 1945/09/27

## 2020-10-02 ENCOUNTER — Ambulatory Visit: Payer: Medicare Other | Admitting: Speech Pathology

## 2020-10-03 LAB — CUP PACEART REMOTE DEVICE CHECK
Date Time Interrogation Session: 20220909230330
Implantable Pulse Generator Implant Date: 20210318

## 2020-10-04 ENCOUNTER — Other Ambulatory Visit: Payer: Self-pay

## 2020-10-04 ENCOUNTER — Ambulatory Visit: Payer: Medicare Other | Admitting: Speech Pathology

## 2020-10-04 ENCOUNTER — Encounter: Payer: Self-pay | Admitting: Speech Pathology

## 2020-10-04 DIAGNOSIS — R4701 Aphasia: Secondary | ICD-10-CM | POA: Diagnosis not present

## 2020-10-04 NOTE — Therapy (Signed)
[place]. [place], [place], [address] Phone: [phone]   Fax:  [phone]  Speech Language Pathology Treatment  Patient Details  Name: Lisa [name] MRN: [id number] Date of Birth: 1945-10-27 Referring Provider (SLP): Cari Caraway MD   Encounter Date: 10/04/2020   End of Session - 10/04/20 1324     Visit Number 8    Number of Visits 17    Date for SLP Re-Evaluation 11/04/20    SLP Start Time 2536    SLP Stop Time  6440    SLP Time Calculation (min) 40 min    Activity Tolerance Patient tolerated treatment well             Past Medical History:  Diagnosis Date   Stroke ([place]) 04/06/2019   Uterine cancer ([place]) 01/25/2016    Past Surgical History:  Procedure Laterality Date   BREAST EXCISIONAL BIOPSY Left 1975   LOOP RECORDER INSERTION N/A 04/07/2019   Procedure: LOOP RECORDER INSERTION;  Surgeon: Evans Lance, MD;  Location: [facility];  Service: Cardiovascular;  Laterality: N/A;   TUBAL LIGATION  1970    There were no vitals filed for this visit.   Subjective Assessment - 10/04/20 1322     Subjective Pt shared that she went to [employer] with friends and tried to use her strategies so she could describe her meal when asked.    Currently in Pain? No/denies                   ADULT SLP TREATMENT - 10/04/20 0001       General Information   Behavior/Cognition Alert;Cooperative      Treatment Provided   Treatment provided Cognitive-Linquistic      Cognitive-Linquistic Treatment   Treatment focused on Aphasia    Skilled Treatment Pt reported succes with using communication strategies in iADLs. Pt utilized the "look it up" communication strategy by taking a photograph of her meal to speak on it later. SLP reviewed communication strategies (describe, delay, gesture) with pt. SLP presented various photographs of daily objects to pt. Pt was instructed to use "describe"  in order to give details of each object. She required modA in order to give 3 descriptions of each object.      Assessment / Recommendations / Plan   Plan Continue with current plan of care      Progression Toward Goals   Progression toward goals Progressing toward goals                SLP Short Term Goals - 10/04/20 1327       SLP SHORT TERM GOAL #1   Title Pt will provide +3 categorical descriptions (SFA) of single item given minA.    Time 4    Period Weeks    Status Achieved    Target Date 10/05/20      SLP SHORT TERM GOAL #2   Title Pt will recall 3 word finding strategies to use in cases of anomia independently.    Time 4    Period Weeks    Status Achieved    Target Date 10/05/20      SLP SHORT TERM GOAL #3   Title Pt and husband will recall 3 communication strategies to aid auditory comprehension.    Time 4    Period Weeks    Status Achieved    Target Date 10/05/20  SLP Long Term Goals - 10/04/20 1325       SLP LONG TERM GOAL #1   Title Pt and family will report successful use of word finding strategies to decrease anomia in conversation.    Time 8    Period Weeks    Status On-going      SLP LONG TERM GOAL #2   Title Pt will have demonstrate <2 instances of anomia in a 20 minute conversation with SLP.    Time 8    Period Weeks    Status Deferred      SLP LONG TERM GOAL #3   Title Pt and husband will demonstrate use of communication strategies to reduce frustration and communication breakdowns.    Time 8    Period Weeks    Status On-going      SLP LONG TERM GOAL #4   Title Pt will expand utterances by 2+ content words during a structured task (RET).    Time 4    Period Weeks    Status New    Target Date 11/01/20              Plan - 10/04/20 1633     Clinical Impression Statement See tx note. Pt reports success with implementing communication strategies in her iADLs. Continue with current POC.    Speech Therapy  Frequency 2x / week    Duration 8 weeks    Treatment/Interventions Compensatory strategies;Cueing hierarchy;Functional tasks;Patient/family education;Environmental controls;Multimodal communcation approach;Language facilitation;Compensatory techniques;Internal/external aids;SLP instruction and feedback    Potential to Achieve Goals Good    Consulted and Agree with Plan of Care Patient;Family member/caregiver    Family Member Consulted Husband             Patient will benefit from skilled therapeutic intervention in order to improve the following deficits and impairments:   Aphasia    Problem List Patient Active Problem List   Diagnosis Date Noted   Amnesia 08/02/2020   Acute CVA (cerebrovascular accident) ([place]) 04/07/2019   CVA (cerebral vascular accident) ([place]) 04/05/2019   TIA (transient ischemic attack) 04/05/2019   Endometrial cancer ([place]) 04/22/2017   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 Communication Sciences and Disorders  10/04/2020, 4:35 PM  [place]. [place], [place], [address] Phone: [phone]   Fax:  [phone]   Name: Lisa [name] MRN: [id number] Date of Birth: 12-23-1945

## 2020-10-08 ENCOUNTER — Ambulatory Visit (INDEPENDENT_AMBULATORY_CARE_PROVIDER_SITE_OTHER): Payer: Medicare Other

## 2020-10-08 ENCOUNTER — Encounter: Payer: Self-pay | Admitting: Speech Pathology

## 2020-10-08 DIAGNOSIS — I639 Cerebral infarction, unspecified: Secondary | ICD-10-CM

## 2020-10-09 ENCOUNTER — Ambulatory Visit: Payer: Medicare Other | Admitting: Speech Pathology

## 2020-10-09 ENCOUNTER — Encounter: Payer: Self-pay | Admitting: Speech Pathology

## 2020-10-09 ENCOUNTER — Other Ambulatory Visit: Payer: Self-pay

## 2020-10-09 DIAGNOSIS — R4701 Aphasia: Secondary | ICD-10-CM

## 2020-10-09 NOTE — Therapy (Signed)
[place]. [place], [place], [address] Phone: [phone]   Fax:  [phone]  Speech Language Pathology Treatment  Patient Details  Name: Lisa [name] MRN: [id number] Date of Birth: 1945-08-07 Referring Provider (SLP): Cari Caraway MD   Encounter Date: 10/09/2020   End of Session - 10/09/20 0900     Visit Number 9    Number of Visits 17    Date for SLP Re-Evaluation 11/04/20    SLP Start Time 0845    SLP Stop Time  0925    SLP Time Calculation (min) 40 min    Activity Tolerance Patient tolerated treatment well             Past Medical History:  Diagnosis Date   Stroke ([facility]) 04/06/2019   Uterine cancer ([facility]) 01/25/2016    Past Surgical History:  Procedure Laterality Date   BREAST EXCISIONAL BIOPSY Left 1975   LOOP RECORDER INSERTION N/A 04/07/2019   Procedure: LOOP RECORDER INSERTION;  Surgeon: Evans Lance, MD;  Location: [facility];  Service: Cardiovascular;  Laterality: N/A;   TUBAL LIGATION  1970    There were no vitals filed for this visit.   Subjective Assessment - 10/09/20 0900     Subjective Pt reports she is doing well.    Patient is accompained by: Family member    Currently in Pain? No/denies                   ADULT SLP TREATMENT - 10/09/20 0001       General Information   Behavior/Cognition Alert;Cooperative      Treatment Provided   Treatment provided Cognitive-Linquistic      Cognitive-Linquistic Treatment   Treatment focused on Aphasia    Skilled Treatment Pt reports success from use of communication strategies in conversation. Pt described object features using an EET tool to the SLP and husband with minA. Pt required modA using the EET to describe objects. Pt was instructed to name 5 objects and describe 3 features of each object with no more than 3 minA in order to meet STG 1. Pt was able to describe objects to SLP using gestures to  faciliate language output with 3 cues from SLP. Pt has met STGs. SLP to update STGs for next visit.      Assessment / Recommendations / Plan   Plan Continue with current plan of care;Goals updated      Progression Toward Goals   Progression toward goals Progressing toward goals                SLP Short Term Goals - 10/09/20 1132       SLP SHORT TERM GOAL #1   Title Pt will provide +3 categorical descriptions (SFA) of single item independently.    Time 4    Period Weeks    Status New    Target Date 11/04/20      SLP SHORT TERM GOAL #2   Title Pt will increase utterances by +1 content word using RET across 4 sessions.    Time 4    Period Weeks    Status New    Target Date 11/04/20              SLP Long Term Goals - 10/04/20 1325       SLP LONG TERM GOAL #1   Title Pt and family will report successful use of word finding strategies to decrease anomia in  conversation.    Time 8    Period Weeks    Status On-going      SLP LONG TERM GOAL #2   Title Pt will have demonstrate <2 instances of anomia in a 20 minute conversation with SLP.    Time 8    Period Weeks    Status Deferred      SLP LONG TERM GOAL #3   Title Pt and husband will demonstrate use of communication strategies to reduce frustration and communication breakdowns.    Time 8    Period Weeks    Status On-going      SLP LONG TERM GOAL #4   Title Pt will expand utterances by 2+ content words during a structured task (RET).    Time 4    Period Weeks    Status New    Target Date 11/01/20              Plan - 10/09/20 0953     Clinical Impression Statement See tx note. Pt has met all STGs. SLP to update STGs. Cont with current POC.    Speech Therapy Frequency 2x / week    Duration 8 weeks    Treatment/Interventions Compensatory strategies;Cueing hierarchy;Functional tasks;Patient/family education;Environmental controls;Multimodal communcation approach;Language facilitation;Compensatory  techniques;Internal/external aids;SLP instruction and feedback    Potential to Achieve Goals Good    Consulted and Agree with Plan of Care Patient;Family member/caregiver    Family Member Consulted Husband             Patient will benefit from skilled therapeutic intervention in order to improve the following deficits and impairments:   Aphasia    Problem List Patient Active Problem List   Diagnosis Date Noted   Amnesia 08/02/2020   Acute CVA (cerebrovascular accident) ([facility]) 04/07/2019   CVA (cerebral vascular accident) ([facility]) 04/05/2019   TIA (transient ischemic attack) 04/05/2019   Endometrial cancer ([facility]) 04/22/2017   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 Communication Sciences and Disorders  10/09/2020, 12:43 PM  [place]. [place], [place], [address] Phone: [phone]   Fax:  [phone]   Name: Lisa [name] MRN: [id number] Date of Birth: 11/15/1945

## 2020-10-11 ENCOUNTER — Other Ambulatory Visit: Payer: Self-pay

## 2020-10-11 ENCOUNTER — Ambulatory Visit: Payer: Medicare Other | Admitting: Speech Pathology

## 2020-10-11 ENCOUNTER — Encounter: Payer: Self-pay | Admitting: Speech Pathology

## 2020-10-11 DIAGNOSIS — R4701 Aphasia: Secondary | ICD-10-CM

## 2020-10-11 NOTE — Therapy (Addendum)
[facility]. [place], [place], [address] Phone: [phone]   Fax:  [phone]  Speech Language Pathology Treatment  Patient Details  Name: Lisa [name] MRN: [id number] Date of Birth: 1945-12-06 Referring Provider (SLP): Cari Caraway MD   Encounter Date: 10/11/2020   End of Session - 10/11/20 0936     Visit Number 10    Number of Visits 17    Date for SLP Re-Evaluation 11/04/20    SLP Start Time 0930    SLP Stop Time  1010    SLP Time Calculation (min) 40 min    Activity Tolerance Patient tolerated treatment well             Past Medical History:  Diagnosis Date   Stroke ([facility]) 04/06/2019   Uterine cancer ([facility]) 01/25/2016    Past Surgical History:  Procedure Laterality Date   BREAST EXCISIONAL BIOPSY Left 1975   LOOP RECORDER INSERTION N/A 04/07/2019   Procedure: LOOP RECORDER INSERTION;  Surgeon: Evans Lance, MD;  Location: [facility];  Service: Cardiovascular;  Laterality: N/A;   TUBAL LIGATION  1970    There were no vitals filed for this visit.   Subjective Assessment - 10/11/20 0933     Subjective Pt reports she is doing well.    Patient is accompained by: Family member    Currently in Pain? No/denies                   ADULT SLP TREATMENT - 10/11/20 1024       General Information   Behavior/Cognition Cooperative;Alert      Treatment Provided   Treatment provided Cognitive-Linquistic      Cognitive-Linquistic Treatment   Treatment focused on Aphasia    Skilled Treatment Pt reports success from utilizing word finding strategies in iADLs. Pt was given two images at a time and instructed to describe one similarity and one difference between the two objects. She completed this task with modA. SLP noted that the pt benefitted from a 15 second delay prior to giving an answer. SLP encouraged husband to use sentence completion cues in conversation when word  finding difficulties arise.      Assessment / Recommendations / Plan   Plan Continue with current plan of care      Progression Toward Goals   Progression toward goals Progressing toward goals                SLP Short Term Goals - 10/09/20 1132       SLP SHORT TERM GOAL #1   Title Pt will provide +3 categorical descriptions (SFA) of single item independently.    Time 4    Period Weeks    Status New    Target Date 11/04/20      SLP SHORT TERM GOAL #2   Title Pt will increase utterances by +1 content word using RET across 4 sessions.    Time 4    Period Weeks    Status New    Target Date 11/04/20              SLP Long Term Goals - 10/04/20 1325       SLP LONG TERM GOAL #1   Title Pt and family will report successful use of word finding strategies to decrease anomia in conversation.    Time 8    Period Weeks    Status On-going      SLP LONG  TERM GOAL #2   Title Pt will have demonstrate <2 instances of anomia in a 20 minute conversation with SLP.    Time 8    Period Weeks    Status Deferred      SLP LONG TERM GOAL #3   Title Pt and husband will demonstrate use of communication strategies to reduce frustration and communication breakdowns.    Time 8    Period Weeks    Status On-going      SLP LONG TERM GOAL #4   Title Pt will expand utterances by 2+ content words during a structured task (RET).    Time 4    Period Weeks    Status New    Target Date 11/01/20              Plan - 10/11/20 1030     Clinical Impression Statement See tx note. Pt benefited from a 15 second delay in order to give more accurate answers. Cont with current POC.    Speech Therapy Frequency 2x / week    Duration 8 weeks    Treatment/Interventions Compensatory strategies;Cueing hierarchy;Functional tasks;Patient/family education;Environmental controls;Multimodal communcation approach;Language facilitation;Compensatory techniques;Internal/external aids;SLP instruction and  feedback    Potential to Achieve Goals Good    Consulted and Agree with Plan of Care Patient;Family member/caregiver    Family Member Consulted Husband             Patient will benefit from skilled therapeutic intervention in order to improve the following deficits and impairments:   Aphasia    Problem List Patient Active Problem List   Diagnosis Date Noted   Amnesia 08/02/2020   Acute CVA (cerebrovascular accident) ([facility]) 04/07/2019   CVA (cerebral vascular accident) ([facility]) 04/05/2019   TIA (transient ischemic attack) 04/05/2019   Endometrial cancer ([facility]) 04/22/2017   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 She responds quickly to cues given by the SLP. Pt demonstrates embarrassment surrounding word finding impairment. SLP working to address and increase confidence.   Objective: See tx note.   Goal Update: See tx note.   Plan: To cont to address word finding deficits to increase expressive language and decrease communication breakdowns.   Reason Skilled Services are Required: SLP rec skilled ST services to address expressive aphasia and maximize functional communication.   [facility]. Communication Sciences and Disorders  10/11/2020, 10:49 AM  [place]. [place], [place], [address] Phone: [phone]   Fax:  [phone]   Name: Lisa [name] MRN: [id number] Date of Birth: 02-Jun-1945

## 2020-10-15 NOTE — Progress Notes (Signed)
Carelink Summary Report / Loop Recorder 

## 2020-10-16 ENCOUNTER — Encounter: Payer: Self-pay | Admitting: Speech Pathology

## 2020-10-16 ENCOUNTER — Other Ambulatory Visit: Payer: Self-pay

## 2020-10-16 ENCOUNTER — Ambulatory Visit: Payer: Medicare Other | Admitting: Speech Pathology

## 2020-10-16 ENCOUNTER — Telehealth: Payer: Self-pay | Admitting: *Deleted

## 2020-10-16 DIAGNOSIS — R4701 Aphasia: Secondary | ICD-10-CM

## 2020-10-16 NOTE — Telephone Encounter (Signed)
Patient called and moved her appt from 11/9 with Dr Denman George to 11/2 with Dr Delsa Sale; since Dr Denman George is leaving the practice

## 2020-10-16 NOTE — Therapy (Signed)
[facility]. [place], [place], [address] Phone: [phone]   Fax:  [phone]  Speech Language Pathology Treatment  Patient Details  Name: Lisa [name] MRN: [id number] Date of Birth: 1945-07-26 Referring Provider (SLP): Cari Caraway MD   Encounter Date: 10/16/2020   End of Session - 10/16/20 0936     Visit Number 11    Number of Visits 17    Date for SLP Re-Evaluation 11/04/20    SLP Start Time 0930    SLP Stop Time  1275    SLP Time Calculation (min) 44 min    Activity Tolerance Patient tolerated treatment well             Past Medical History:  Diagnosis Date   Stroke ([facility]) 04/06/2019   Uterine cancer ([facility]) 01/25/2016    Past Surgical History:  Procedure Laterality Date   BREAST EXCISIONAL BIOPSY Left 1975   LOOP RECORDER INSERTION N/A 04/07/2019   Procedure: LOOP RECORDER INSERTION;  Surgeon: Evans Lance, MD;  Location: [facility];  Service: Cardiovascular;  Laterality: N/A;   TUBAL LIGATION  1970    There were no vitals filed for this visit.   Subjective Assessment - 10/16/20 0932     Subjective Pt reports that she feels her communication is about the same.    Patient is accompained by: Family member    Currently in Pain? No/denies                   ADULT SLP TREATMENT - 10/16/20 1211       General Information   Behavior/Cognition Alert;Cooperative      Treatment Provided   Treatment provided Cognitive-Linquistic      Cognitive-Linquistic Treatment   Treatment focused on Aphasia    Skilled Treatment Pt participated in RET training targeting sentence expansion and improved word retrieval. Pt was provided with action stimuli and initially formed sentences with an average of 4.2. After SLP intervention which included shaping and modeling of responses, the pt was able to generate an average of 5.6 content words per sentence. SLP noted that pt benefited from  extra time processing the picture (20 seconds) in order to generate a repsonse.      Assessment / Recommendations / Plan   Plan Continue with current plan of care      Progression Toward Goals   Progression toward goals Progressing toward goals                SLP Short Term Goals - 10/09/20 1132       SLP SHORT TERM GOAL #1   Title Pt will provide +3 categorical descriptions (SFA) of single item independently.    Time 4    Period Weeks    Status New    Target Date 11/04/20      SLP SHORT TERM GOAL #2   Title Pt will increase utterances by +1 content word using RET across 4 sessions.    Time 4    Period Weeks    Status New    Target Date 11/04/20              SLP Long Term Goals - 10/04/20 1325       SLP LONG TERM GOAL #1   Title Pt and family will report successful use of word finding strategies to decrease anomia in conversation.    Time 8    Period Weeks    Status On-going  SLP LONG TERM GOAL #2   Title Pt will have demonstrate <2 instances of anomia in a 20 minute conversation with SLP.    Time 8    Period Weeks    Status Deferred      SLP LONG TERM GOAL #3   Title Pt and husband will demonstrate use of communication strategies to reduce frustration and communication breakdowns.    Time 8    Period Weeks    Status On-going      SLP LONG TERM GOAL #4   Title Pt will expand utterances by 2+ content words during a structured task (RET).    Time 4    Period Weeks    Status New    Target Date 11/01/20              Plan - 10/16/20 0160     Clinical Impression Statement See tx note. Pt benefited from a 15 second delay prior to generating sentence. Cont with current POC.    Speech Therapy Frequency 2x / week    Duration 8 weeks    Treatment/Interventions Compensatory strategies;Cueing hierarchy;Functional tasks;Patient/family education;Environmental controls;Multimodal communcation approach;Language facilitation;Compensatory  techniques;Internal/external aids;SLP instruction and feedback    Potential to Achieve Goals Good    Consulted and Agree with Plan of Care Patient;Family member/caregiver    Family Member Consulted Husband             Patient will benefit from skilled therapeutic intervention in order to improve the following deficits and impairments:   Aphasia    Problem List Patient Active Problem List   Diagnosis Date Noted   Amnesia 08/02/2020   Acute CVA (cerebrovascular accident) ([facility]) 04/07/2019   CVA (cerebral vascular accident) ([facility]) 04/05/2019   TIA (transient ischemic attack) 04/05/2019   Endometrial cancer ([facility]) 04/22/2017   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 Communication Sciences and Disorders  10/16/2020, 12:24 PM  [place]. [place], [place], [address] Phone: [phone]   Fax:  [phone]   Name: Lisa [name] MRN: [id number] Date of Birth: [birth year]

## 2020-10-18 ENCOUNTER — Other Ambulatory Visit: Payer: Self-pay

## 2020-10-18 ENCOUNTER — Ambulatory Visit: Payer: Medicare Other | Admitting: Speech Pathology

## 2020-10-18 ENCOUNTER — Encounter: Payer: Self-pay | Admitting: Speech Pathology

## 2020-10-18 DIAGNOSIS — R4701 Aphasia: Secondary | ICD-10-CM

## 2020-10-18 NOTE — Therapy (Signed)
[facility] [address] [place], [place], [address] Phone: [phone]   Fax:  [phone]  Speech Language Pathology Treatment  Patient Details  Name: Lisa [name] MRN: [id number] Date of Birth: 08/06/1945 Referring Provider (SLP): McNeill, Wendy MD   Encounter Date: 10/18/2020   End of Session - 10/18/20 0935     Visit Number 12    Number of Visits 17    Date for SLP Re-Evaluation 11/04/20    SLP Start Time 0932    SLP Stop Time  1014    SLP Time Calculation (min) 42 min    Activity Tolerance Patient tolerated treatment well             Past Medical History:  Diagnosis Date   Stroke ([facility]) 04/06/2019   Uterine cancer ([facility]) 01/25/2016    Past Surgical History:  Procedure Laterality Date   BREAST EXCISIONAL BIOPSY Left 1975   LOOP RECORDER INSERTION N/A 04/07/2019   Procedure: LOOP RECORDER INSERTION;  Surgeon: Taylor, Gregg W, MD;  [facility]: [facility];  Service: Cardiovascular;  Laterality: N/A;   TUBAL LIGATION  1970    There were no vitals filed for this visit.   Subjective Assessment - 10/18/20 0933     Subjective Pt reported she spoke to her daughter and she is not sure if it is going well because "I can think right now".    Patient is accompained by: Family member    Currently in Pain? No/denies                   ADULT SLP TREATMENT - 10/18/20 0952       General Information   Behavior/Cognition Alert;Cooperative      Treatment Provided   Treatment provided Cognitive-Linquistic      Cognitive-Linquistic Treatment   Treatment focused on Aphasia    Skilled Treatment SLP facilitated verbal expression with describing holidays and holiday related vocabulary. Pt required modA to maxA to retrieve  vocabulary when describing family traditions. Using SFA, pt was able to describe a given word with prompting questions and min verbal cues.      Assessment / Recommendations / Plan    Plan Continue with current plan of care      Progression Toward Goals   Progression toward goals Progressing toward goals                SLP Short Term Goals - 10/09/20 1132       SLP SHORT TERM GOAL #1   Title Pt will provide +3 categorical descriptions (SFA) of single item independently.    Time 4    Period Weeks    Status New    Target Date 11/04/20      SLP SHORT TERM GOAL #2   Title Pt will increase utterances by +1 content word using RET across 4 sessions.    Time 4    Period Weeks    Status New    Target Date 11/04/20              SLP Long Term Goals - 10/04/20 1325       SLP LONG TERM GOAL #1   Title Pt and family will report successful use of word finding strategies to decrease anomia in conversation.    Time 8    Period Weeks    Status On-going      SLP LONG TERM GOAL #2   Title Pt will have demonstrate <  2 instances of anomia in a 20 minute conversation with SLP.    Time 8    Period Weeks    Status Deferred      SLP LONG TERM GOAL #3   Title Pt and husband will demonstrate use of communication strategies to reduce frustration and communication breakdowns.    Time 8    Period Weeks    Status On-going      SLP LONG TERM GOAL #4   Title Pt will expand utterances by 2+ content words during a structured task (RET).    Time 4    Period Weeks    Status New    Target Date 11/01/20              Plan - 10/18/20 0936     Clinical Impression Statement See tx note. Pt benefited from a 15 second delay prior to generating sentence. We spoke about shifting how she describes her impairment from: "I can't remember" to "I can't find the word." Cont with current POC.    Speech Therapy Frequency 2x / week    Duration 8 weeks    Treatment/Interventions Compensatory strategies;Cueing hierarchy;Functional tasks;Patient/family education;Environmental controls;Multimodal communcation approach;Language facilitation;Compensatory techniques;Internal/external  aids;SLP instruction and feedback    Potential to Achieve Goals Good    Consulted and Agree with Plan of Care Patient;Family member/caregiver    Family Member Consulted Husband             Patient will benefit from skilled therapeutic intervention in order to improve the following deficits and impairments:   Aphasia    Problem List Patient Active Problem List   Diagnosis Date Noted   Amnesia 08/02/2020   Acute CVA (cerebrovascular accident) ([facility]) 04/07/2019   CVA (cerebral vascular accident) ([facility]) 04/05/2019   TIA (transient ischemic attack) 04/05/2019   Endometrial cancer ([facility]) 04/22/2017   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 Communication Sciences and Disorders  10/18/2020, 10:23 AM  [facility] [address] [place], [place], [address] Phone: [phone]   Fax:  [phone]   Name: Lisa [name] MRN: [id number] Date of Birth: 10/03/1945  

## 2020-10-23 ENCOUNTER — Ambulatory Visit: Payer: Medicare Other | Attending: Family Medicine | Admitting: Speech Pathology

## 2020-10-23 ENCOUNTER — Other Ambulatory Visit: Payer: Self-pay

## 2020-10-23 DIAGNOSIS — R4701 Aphasia: Secondary | ICD-10-CM | POA: Insufficient documentation

## 2020-10-23 NOTE — Therapy (Signed)
[place]. [place], [place], [address] Phone: [phone]   Fax:  [phone]  Speech Language Pathology Treatment  Patient Details  Name: Lisa [name] MRN: [id number] Date of Birth: 1945/05/22 Referring Provider (SLP): Cari Caraway MD   Encounter Date: 10/23/2020   End of Session - 10/23/20 1314     Visit Number 13    Number of Visits 17    Date for SLP Re-Evaluation 11/04/20    SLP Start Time 0940    SLP Stop Time  1020    SLP Time Calculation (min) 40 min    Activity Tolerance Patient tolerated treatment well             Past Medical History:  Diagnosis Date   Stroke ([facility]) 04/06/2019   Uterine cancer ([facility]) 01/25/2016    Past Surgical History:  Procedure Laterality Date   BREAST EXCISIONAL BIOPSY Left 1975   LOOP RECORDER INSERTION N/A 04/07/2019   Procedure: LOOP RECORDER INSERTION;  Surgeon: Evans Lance, MD;  Location: [facility];  Service: Cardiovascular;  Laterality: N/A;   TUBAL LIGATION  1970    There were no vitals filed for this visit.          ADULT SLP TREATMENT - 10/23/20 1158       General Information   Behavior/Cognition Alert;Cooperative;Pleasant mood      Treatment Provided   Treatment provided Cognitive-Linquistic      Pain Assessment   Pain Assessment 0-10      Cognitive-Linquistic Treatment   Treatment focused on Cognition    Skilled Treatment Pt reports success of utilizing "describe" in conversations when communication breakdowns occur. Pt completed SFA using an EET tool with minA. Pt benefited from sentence completion cues in order to give an accurate description. SLP instructed pt to write down the given object and descriptions. SLP noted pt had increased difficulty with spelling words while writing. SLP encouraged pt to describe 5 objects around the house as part of HEP.      Assessment / Recommendations / Plan   Plan Continue with  current plan of care      Progression Toward Goals   Progression toward goals Progressing toward goals                SLP Short Term Goals - 10/09/20 1132       SLP SHORT TERM GOAL #1   Title Pt will provide +3 categorical descriptions (SFA) of single item independently.    Time 4    Period Weeks    Status New    Target Date 11/04/20      SLP SHORT TERM GOAL #2   Title Pt will increase utterances by +1 content word using RET across 4 sessions.    Time 4    Period Weeks    Status New    Target Date 11/04/20              SLP Long Term Goals - 10/04/20 1325       SLP LONG TERM GOAL #1   Title Pt and family will report successful use of word finding strategies to decrease anomia in conversation.    Time 8    Period Weeks    Status On-going      SLP LONG TERM GOAL #2   Title Pt will have demonstrate <2 instances of anomia in a 20 minute conversation with SLP.    Time 8  Period Weeks    Status Deferred      SLP LONG TERM GOAL #3   Title Pt and husband will demonstrate use of communication strategies to reduce frustration and communication breakdowns.    Time 8    Period Weeks    Status On-going      SLP LONG TERM GOAL #4   Title Pt will expand utterances by 2+ content words during a structured task (RET).    Time 4    Period Weeks    Status New    Target Date 11/01/20              Plan - 10/23/20 1222     Clinical Impression Statement See tx note. Pt benefited from sentence completion cues in order to find the most accurate description during SFA. Cont with current POC.    Speech Therapy Frequency 2x / week    Duration 8 weeks    Treatment/Interventions Compensatory strategies;Cueing hierarchy;Functional tasks;Patient/family education;Environmental controls;Multimodal communcation approach;Language facilitation;Compensatory techniques;Internal/external aids;SLP instruction and feedback    Potential to Achieve Goals Good    Consulted and Agree  with Plan of Care Patient;Family member/caregiver    Family Member Consulted Husband             Patient will benefit from skilled therapeutic intervention in order to improve the following deficits and impairments:   Aphasia    Problem List Patient Active Problem List   Diagnosis Date Noted   Amnesia 08/02/2020   Acute CVA (cerebrovascular accident) ([facility]) 04/07/2019   CVA (cerebral vascular accident) ([facility]) 04/05/2019   TIA (transient ischemic attack) 04/05/2019   Endometrial cancer ([facility]) 04/22/2017   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 Communication Sciences and Disorders  10/23/2020, 1:23 PM  [place] [place]. [place], [place], [address] Phone: [phone]   Fax:  [phone]   Name: Lisa [name] MRN: [id number] Date of Birth: 1945-09-13

## 2020-10-24 IMAGING — MG MM DIGITAL DIAGNOSTIC UNILAT*R* W/ TOMO W/ CAD
4 series · 4 of 12 positions shown · non-contrast
Comparison: Previous exam(s).

CLINICAL DATA: Screening recall for possible mass in the right
breast.

EXAM:
DIGITAL DIAGNOSTIC UNILATERAL RIGHT MAMMOGRAM WITH CAD AND TOMO

[R CC synth-2D]
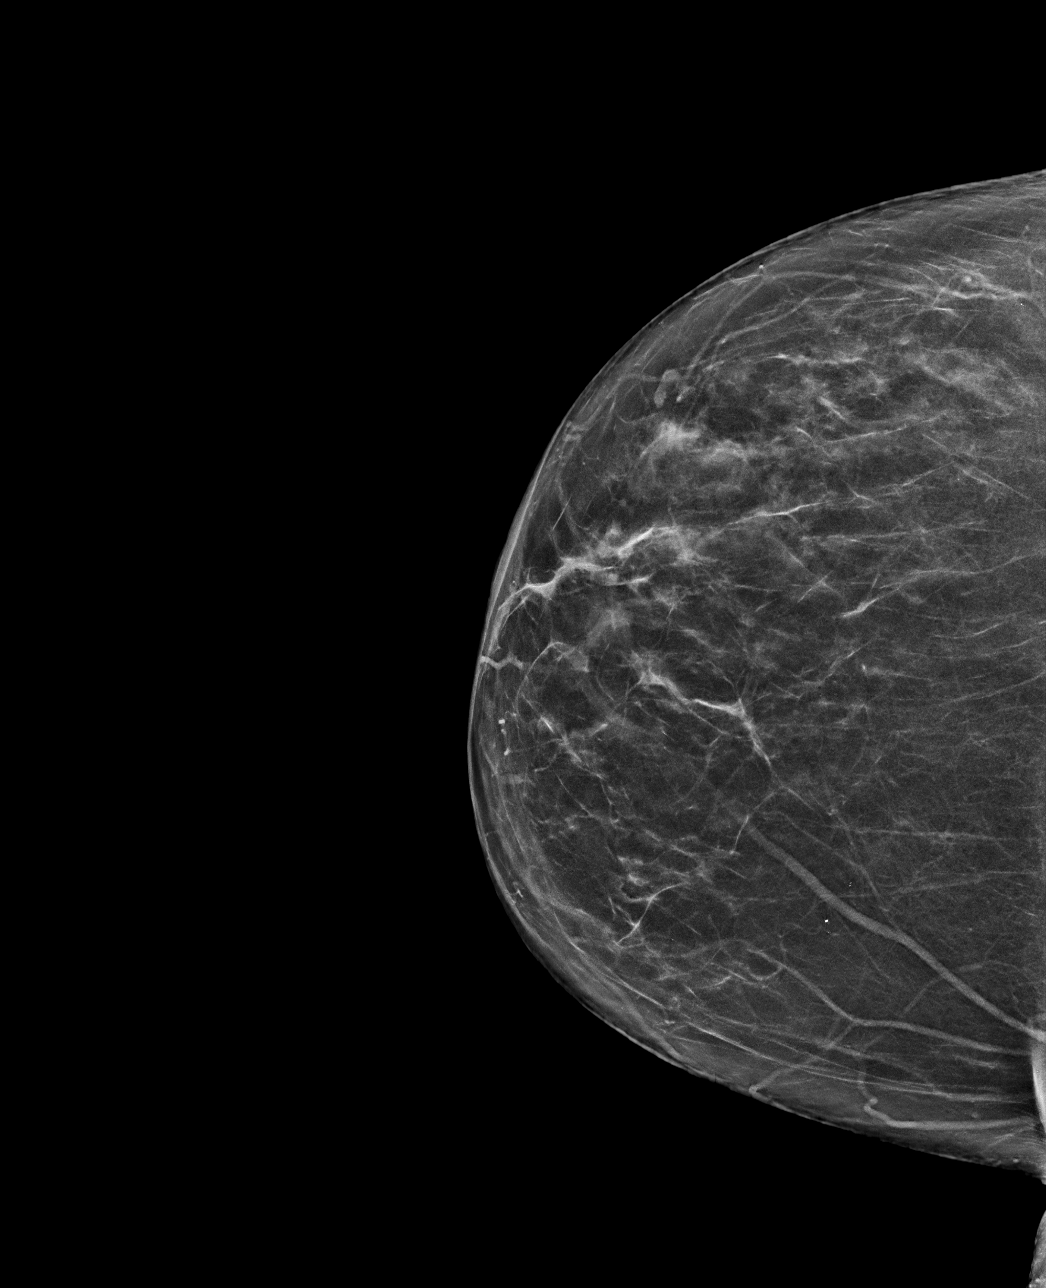

[R MLO synth-2D]
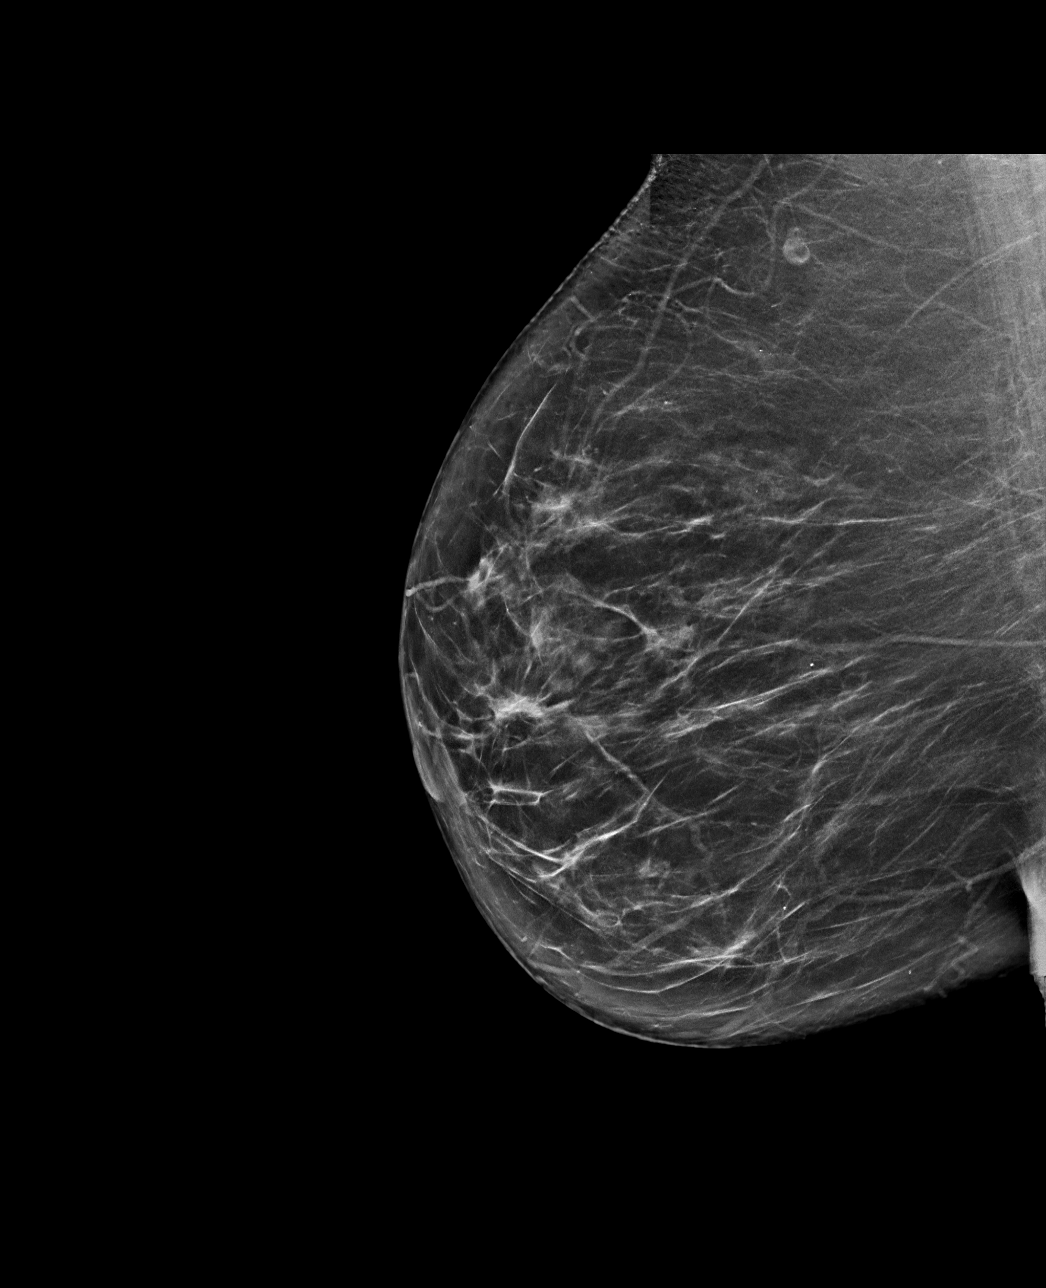

[R CC tomo · tomo slice 33/66.0]
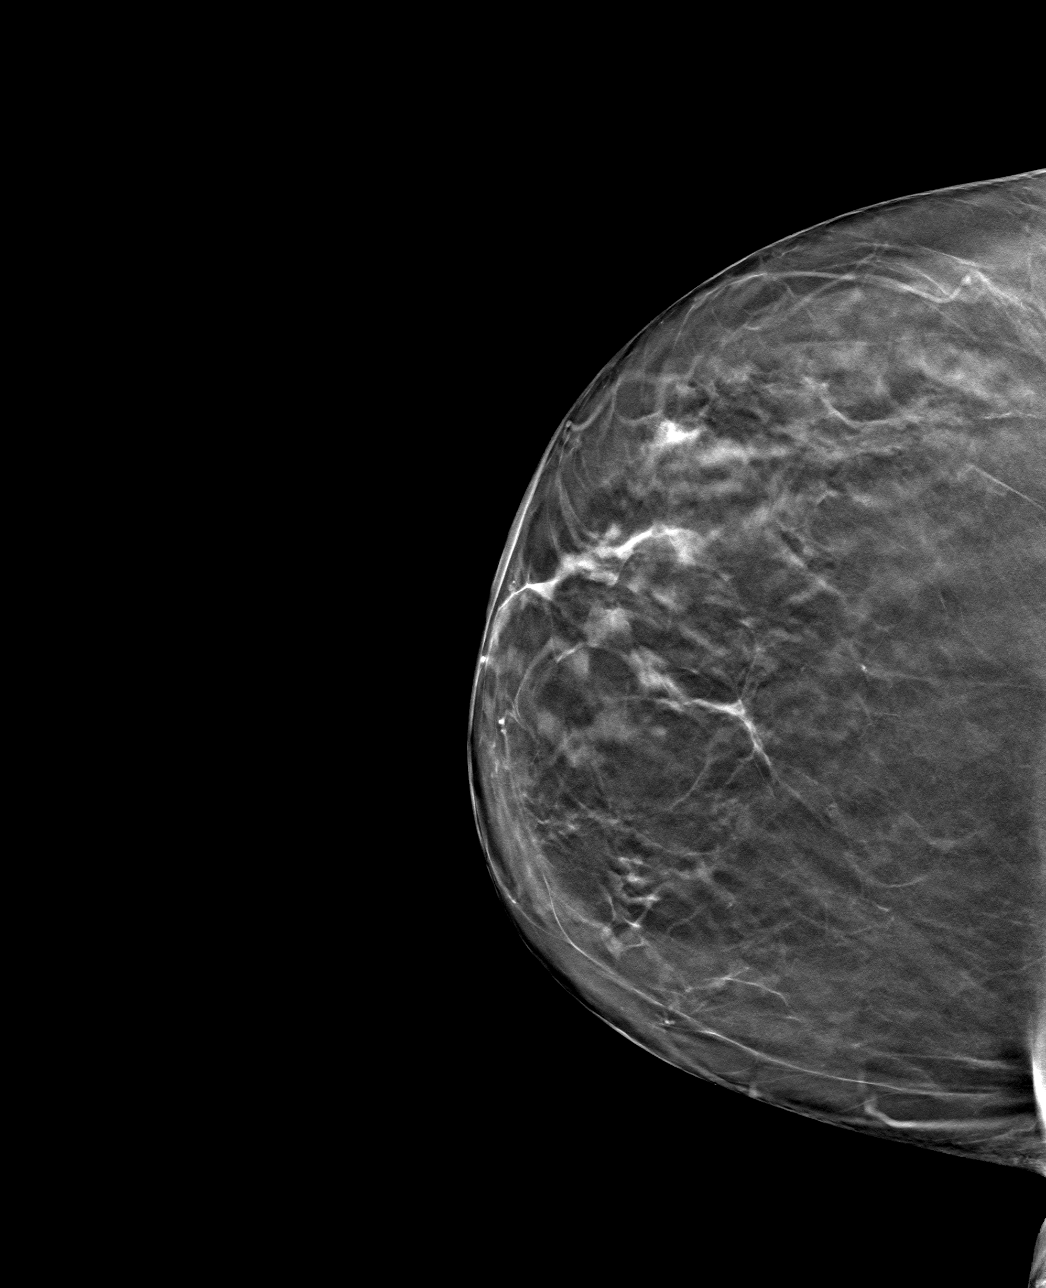

[R MLO tomo · tomo slice 40/79.0]
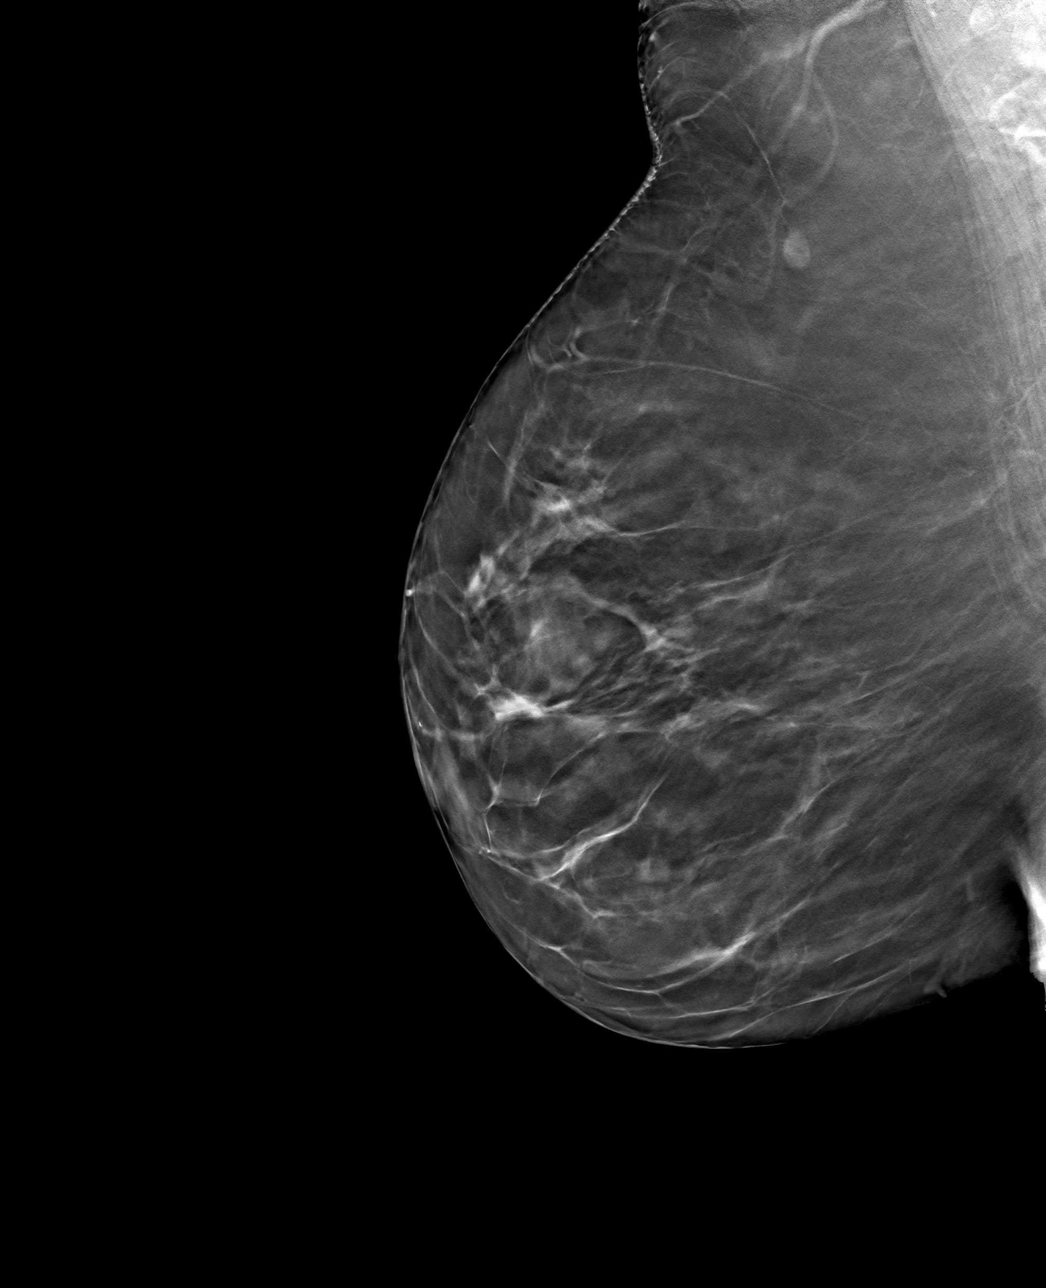

[4 of 12 positions shown; findings below may reference images not displayed]

ACR Breast Density Category b: There are scattered areas of
fibroglandular density.
FINDINGS: Possible asymmetry noted on the 2D screening exam disperses on
tomosynthesis images consistent with superimposed fibroglandular
tissue. There is no underlying mass. There are no areas of
architectural distortion and there are no suspicious calcifications.

Mammographic images were processed with CAD.
IMPRESSION: Negative exam.  No evidence of breast malignancy

RECOMMENDATION:
Screening mammogram in one year.(Code:[id number]-[id number])

I have discussed the findings and recommendations with the patient.
If applicable, a reminder letter will be sent to the patient
regarding the next appointment.

BI-RADS CATEGORY  1: Negative.

## 2020-10-25 ENCOUNTER — Other Ambulatory Visit: Payer: Self-pay

## 2020-10-25 ENCOUNTER — Encounter: Payer: Self-pay | Admitting: Speech Pathology

## 2020-10-25 ENCOUNTER — Ambulatory Visit: Payer: Medicare Other | Admitting: Speech Pathology

## 2020-10-25 DIAGNOSIS — R4701 Aphasia: Secondary | ICD-10-CM | POA: Diagnosis not present

## 2020-10-25 NOTE — Therapy (Signed)
[place]. [place], [place], [address] Phone: [phone]   Fax:  [phone]  Speech Language Pathology Treatment  Patient Details  Name: Lisa [name] MRN: [id number] Date of Birth: 10-03-1945 Referring Provider (SLP): Cari Caraway MD   Encounter Date: 10/25/2020   End of Session - 10/25/20 0943     Visit Number 14    Number of Visits 17    Date for SLP Re-Evaluation 11/04/20    SLP Start Time 0930    SLP Stop Time  1010    SLP Time Calculation (min) 40 min    Activity Tolerance Patient tolerated treatment well             Past Medical History:  Diagnosis Date   Stroke ([place]) 04/06/2019   Uterine cancer ([place]) 01/25/2016    Past Surgical History:  Procedure Laterality Date   BREAST EXCISIONAL BIOPSY Left 1975   LOOP RECORDER INSERTION N/A 04/07/2019   Procedure: LOOP RECORDER INSERTION;  Surgeon: Evans Lance, MD;  Location: [facility];  Service: Cardiovascular;  Laterality: N/A;   TUBAL LIGATION  1970    There were no vitals filed for this visit.   Subjective Assessment - 10/25/20 0941     Subjective Pt reports she continues to feel "at a loss for words".    Patient is accompained by: Family member    Currently in Pain? No/denies                   ADULT SLP TREATMENT - 10/25/20 1441       General Information   Behavior/Cognition Alert;Cooperative      Treatment Provided   Treatment provided Cognitive-Linquistic      Cognitive-Linquistic Treatment   Treatment focused on Cognition    Skilled Treatment Pt and husband report success with using strategies to describe objects at home as part of HEP to promote carryover. Pt participated in a sentence generating activity. She was instructed to find keywords regarding the context of images, then create sentences using the keywords. She was able to complete this task with modA. SLP noted pt benefited from verbal cues  in order to find keywords.      Assessment / Recommendations / Plan   Plan Continue with current plan of care      Progression Toward Goals   Progression toward goals Progressing toward goals                SLP Short Term Goals - 10/09/20 1132       SLP SHORT TERM GOAL #1   Title Pt will provide +3 categorical descriptions (SFA) of single item independently.    Time 4    Period Weeks    Status New    Target Date 11/04/20      SLP SHORT TERM GOAL #2   Title Pt will increase utterances by +1 content word using RET across 4 sessions.    Time 4    Period Weeks    Status New    Target Date 11/04/20              SLP Long Term Goals - 10/04/20 1325       SLP LONG TERM GOAL #1   Title Pt and family will report successful use of word finding strategies to decrease anomia in conversation.    Time 8    Period Weeks    Status On-going  SLP LONG TERM GOAL #2   Title Pt will have demonstrate <2 instances of anomia in a 20 minute conversation with SLP.    Time 8    Period Weeks    Status Deferred      SLP LONG TERM GOAL #3   Title Pt and husband will demonstrate use of communication strategies to reduce frustration and communication breakdowns.    Time 8    Period Weeks    Status On-going      SLP LONG TERM GOAL #4   Title Pt will expand utterances by 2+ content words during a structured task (RET).    Time 4    Period Weeks    Status New    Target Date 11/01/20               Patient will benefit from skilled therapeutic intervention in order to improve the following deficits and impairments:   No diagnosis found.    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 Communication Sciences and Disorders  10/25/2020, 2:53 PM  [place]. [place], [place], [address] Phone: [phone]   Fax:  [phone]   Name: Lisa [name] MRN: [id number] Date of Birth: 07-03-1945

## 2020-10-30 ENCOUNTER — Other Ambulatory Visit: Payer: Self-pay

## 2020-10-30 ENCOUNTER — Encounter: Payer: Self-pay | Admitting: Speech Pathology

## 2020-10-30 ENCOUNTER — Ambulatory Visit: Payer: Medicare Other | Admitting: Speech Pathology

## 2020-10-30 DIAGNOSIS — R4701 Aphasia: Secondary | ICD-10-CM

## 2020-10-30 NOTE — Therapy (Signed)
[facility] [place]. [place], [place], [address] Phone: [phone]   Fax:  [phone]  Speech Language Pathology Treatment  Patient Details  Name: Lisa [name] MRN: [id number] Date of Birth: Feb 04, 1945 Referring Provider (SLP): Cari Caraway MD   Encounter Date: 10/30/2020   End of Session - 10/30/20 0934     Visit Number 15    Number of Visits 17    Date for SLP Re-Evaluation 11/04/20    SLP Start Time 0930    SLP Stop Time  4081    SLP Time Calculation (min) 45 min    Activity Tolerance Patient tolerated treatment well             Past Medical History:  Diagnosis Date   Stroke ([facility]) 04/06/2019   Uterine cancer ([facility]) 01/25/2016    Past Surgical History:  Procedure Laterality Date   BREAST EXCISIONAL BIOPSY Left 1975   LOOP RECORDER INSERTION N/A 04/07/2019   Procedure: LOOP RECORDER INSERTION;  Surgeon: Evans Lance, MD;  Location: [facility];  Service: Cardiovascular;  Laterality: N/A;   TUBAL LIGATION  1970    There were no vitals filed for this visit.   Subjective Assessment - 10/30/20 0932     Subjective "I am doing pretty good."    Currently in Pain? No/denies                   ADULT SLP TREATMENT - 10/30/20 1012       General Information   Behavior/Cognition Alert;Cooperative      Treatment Provided   Treatment provided Cognitive-Linquistic      Cognitive-Linquistic Treatment   Treatment focused on Cognition    Skilled Treatment Trained pt in SFA using visual aid and sentence completion cues. Pt  benefited from this new cue provided and required minA as opposed to [place] in previous therapeutic tasks. Provided SFA HEP.      Assessment / Recommendations / Plan   Plan Continue with current plan of care      Progression Toward Goals   Progression toward goals Progressing toward goals                SLP Short Term Goals - 10/09/20 1132       SLP  SHORT TERM GOAL #1   Title Pt will provide +3 categorical descriptions (SFA) of single item independently.    Time 4    Period Weeks    Status New    Target Date 11/04/20      SLP SHORT TERM GOAL #2   Title Pt will increase utterances by +1 content word using RET across 4 sessions.    Time 4    Period Weeks    Status New    Target Date 11/04/20              SLP Long Term Goals - 10/04/20 1325       SLP LONG TERM GOAL #1   Title Pt and family will report successful use of word finding strategies to decrease anomia in conversation.    Time 8    Period Weeks    Status On-going      SLP LONG TERM GOAL #2   Title Pt will have demonstrate <2 instances of anomia in a 20 minute conversation with SLP.    Time 8    Period Weeks    Status Deferred      SLP LONG TERM GOAL #  3   Title Pt and husband will demonstrate use of communication strategies to reduce frustration and communication breakdowns.    Time 8    Period Weeks    Status On-going      SLP LONG TERM GOAL #4   Title Pt will expand utterances by 2+ content words during a structured task (RET).    Time 4    Period Weeks    Status New    Target Date 11/01/20               Patient will benefit from skilled therapeutic intervention in order to improve the following deficits and impairments:   Aphasia    Problem List Patient Active Problem List   Diagnosis Date Noted   Amnesia 08/02/2020   Acute CVA (cerebrovascular accident) [facility]) 04/07/2019   CVA (cerebral vascular accident) ([place]) 04/05/2019   TIA (transient ischemic attack) 04/05/2019   Endometrial cancer ([place]) 04/22/2017   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    [name] MS, [place], CBIS  10/30/2020, 10:14 AM  [place]. [place], [place], [address] Phone: [phone]   Fax:  [phone]   Name: Lisa [name] MRN: [id number] Date of Birth:  1945/12/09

## 2020-11-01 ENCOUNTER — Encounter: Payer: Medicare Other | Admitting: Speech Pathology

## 2020-11-05 ENCOUNTER — Ambulatory Visit (INDEPENDENT_AMBULATORY_CARE_PROVIDER_SITE_OTHER): Payer: Medicare Other

## 2020-11-05 DIAGNOSIS — I639 Cerebral infarction, unspecified: Secondary | ICD-10-CM | POA: Diagnosis not present

## 2020-11-06 ENCOUNTER — Ambulatory Visit: Payer: Medicare Other | Admitting: Speech Pathology

## 2020-11-06 ENCOUNTER — Other Ambulatory Visit: Payer: Self-pay

## 2020-11-06 ENCOUNTER — Encounter: Payer: Self-pay | Admitting: Speech Pathology

## 2020-11-06 DIAGNOSIS — R4701 Aphasia: Secondary | ICD-10-CM

## 2020-11-06 NOTE — Therapy (Addendum)
[place]. [place], [place], [address] Phone: [phone]   Fax:  [phone]  Speech Language Pathology Treatment and Recertification  Patient Details  Name: Lisa [name] MRN: [id number] Date of Birth: March 22, 1945 Referring Provider (SLP): Cari Caraway MD   Encounter Date: 11/06/2020     Past Medical History:  Diagnosis Date   Stroke [facility]) 04/06/2019   Uterine cancer ([facility]) 01/25/2016    Past Surgical History:  Procedure Laterality Date   BREAST EXCISIONAL BIOPSY Left 1975   LOOP RECORDER INSERTION N/A 04/07/2019   Procedure: LOOP RECORDER INSERTION;  Surgeon: Evans Lance, MD;  Location: [facility];  Service: Cardiovascular;  Laterality: N/A;   TUBAL LIGATION  1970    There were no vitals filed for this visit.                SLP Short Term Goals - 11/06/20 0941       SLP SHORT TERM GOAL #1   Title Pt will provide +3 categorical descriptions (SFA) of single item independently.    Time 4    Period Weeks    Status Achieved    Target Date 11/04/20      SLP SHORT TERM GOAL #2   Title Pt will increase utterances by +1 content word using RET across 4 sessions.    Time 4    Period Weeks    Status Achieved    Target Date 11/04/20      SLP SHORT TERM GOAL #3   Title Pt will create 3 different subjects and objects for a single verb to increase the specificity of nouns and verbs in sentences during VNeST across 4 sessions.    Time 2    Period Weeks    Status New      SLP SHORT TERM GOAL #4   Title Pt will describe a picture using a subject-verb-object sentence given minA across 4 sessions.    Time 2    Period Weeks    Status New    Target Date 11/20/20              SLP Long Term Goals - 11/06/20 0947       SLP LONG TERM GOAL #1   Title Pt and family will report successful use of word finding strategies to decrease anomia in conversation.    Time 8     Period Weeks    Status Achieved      SLP LONG TERM GOAL #2   Title Pt will have demonstrate <2 instances of anomia in a 20 minute conversation with SLP.    Time 8    Period Weeks    Status Deferred      SLP LONG TERM GOAL #3   Title Pt and husband will demonstrate use of communication strategies to reduce frustration and communication breakdowns.    Time 8    Period Weeks    Status Achieved      SLP LONG TERM GOAL #4   Title Pt will expand utterances by 2+ content words during a structured task (RET).    Time 4    Period Weeks    Status Achieved      SLP LONG TERM GOAL #5   Title Pt will create 2 different subjects and objects for a single verb to increase the specificity of nouns and verbs in sentences during VNeST across 4 sessions.    Time 4    Period  Weeks    Status New    Target Date 12/04/20      Additional Long Term Goals   Additional Long Term Goals Yes      SLP LONG TERM GOAL #6   Title Pt will describe a picture using a subject-verb-object sentence independently across 4 sessions.    Time 4    Period Weeks    Status New    Target Date 12/04/20                Patient will benefit from skilled therapeutic intervention in order to improve the following deficits and impairments:   Aphasia - Plan: SLP plan of care cert/re-cert    Problem List Patient Active Problem List   Diagnosis Date Noted   Amnesia 08/02/2020   Acute CVA (cerebrovascular accident) ([facility]) 04/07/2019   CVA (cerebral vascular accident) ([place]) 04/05/2019   TIA (transient ischemic attack) 04/05/2019   Endometrial cancer ([place]) 04/22/2017   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    [name] MS, [place], CBIS  11/08/2020, 9:44 AM  [place]. [place], [place], [address] Phone: [phone]   Fax:  [phone]   Name: Lisa [name] MRN: [id number] Date of Birth: 1945-02-04

## 2020-11-07 LAB — CUP PACEART REMOTE DEVICE CHECK
Date Time Interrogation Session: 20221012231052
Implantable Pulse Generator Implant Date: 20210318

## 2020-11-08 ENCOUNTER — Ambulatory Visit: Payer: Medicare Other | Admitting: Speech Pathology

## 2020-11-08 ENCOUNTER — Encounter: Payer: Self-pay | Admitting: Speech Pathology

## 2020-11-08 ENCOUNTER — Other Ambulatory Visit: Payer: Self-pay

## 2020-11-08 DIAGNOSIS — R4701 Aphasia: Secondary | ICD-10-CM

## 2020-11-08 NOTE — Therapy (Addendum)
[place]. [place], [place], [address] Phone: [phone]   Fax:  [phone]  Speech Language Pathology Treatment  Patient Details  Name: Lisa [name] MRN: [id number] Date of Birth: [birth year] Referring Provider (SLP): Cari Caraway MD   Encounter Date: 11/08/2020   End of Session - 11/08/20 0939     Date for SLP Re-Evaluation 12/05/20    Authorization - Visit Number 2    Authorization - Number of Visits 8    SLP Start Time 0930    SLP Stop Time  2706    SLP Time Calculation (min) 45 min    Activity Tolerance Patient tolerated treatment well             Past Medical History:  Diagnosis Date   Stroke ([place]) 04/06/2019   Uterine cancer ([place]) 01/25/2016    Past Surgical History:  Procedure Laterality Date   BREAST EXCISIONAL BIOPSY Left 1975   LOOP RECORDER INSERTION N/A 04/07/2019   Procedure: LOOP RECORDER INSERTION;  Surgeon: Evans Lance, MD;  Location: [facility];  Service: Cardiovascular;  Laterality: N/A;   TUBAL LIGATION  1970    There were no vitals filed for this visit.   Subjective Assessment - 11/08/20 0937     Subjective Pt was able to share about her husband's appointment.    Patient is accompained by: Family member    Currently in Pain? No/denies                   ADULT SLP TREATMENT - 11/08/20 0942       General Information   Behavior/Cognition Alert;Cooperative      Treatment Provided   Treatment provided Cognitive-Linquistic      Cognitive-Linquistic Treatment   Treatment focused on Aphasia;Other (comment)   All previous tx should say "aphasia" vs. cognition. SLP clicking error.   Skilled Treatment Pt reports success with communication in iADLs. She "gives herself enough time" when speaking with others. Pt participated in RET in order to practice expressing SVO sentence structure. Pt was provided with action picture stimuli and initially  created sentences with 5.8 content words. With modeling and shaping from SLP, pt increased her sentences to 6.1 content words.  She completed this task with modA. SLP noted that pt benefited from a visual cue, "the", in order to begin a sentence.      Assessment / Recommendations / Plan   Plan Continue with current plan of care      Progression Toward Goals   Progression toward goals Progressing toward goals                SLP Short Term Goals - 11/06/20 0941       SLP SHORT TERM GOAL #1   Title Pt will provide +3 categorical descriptions (SFA) of single item independently.    Time 4    Period Weeks    Status Achieved    Target Date 11/04/20      SLP SHORT TERM GOAL #2   Title Pt will increase utterances by +1 content word using RET across 4 sessions.    Time 4    Period Weeks    Status Achieved    Target Date 11/04/20      SLP SHORT TERM GOAL #3   Title Pt will create 3 different subjects and objects for a single verb to increase the specificity of nouns and verbs in sentences during VNeST across 4 sessions.  Time 2    Period Weeks    Status New      SLP SHORT TERM GOAL #4   Title Pt will describe a picture using a subject-verb-object sentence given minA across 4 sessions.    Time 2    Period Weeks    Status New    Target Date 11/20/20              SLP Long Term Goals - 11/06/20 0947       SLP LONG TERM GOAL #1   Title Pt and family will report successful use of word finding strategies to decrease anomia in conversation.    Time 8    Period Weeks    Status Achieved      SLP LONG TERM GOAL #2   Title Pt will have demonstrate <2 instances of anomia in a 20 minute conversation with SLP.    Time 8    Period Weeks    Status Deferred      SLP LONG TERM GOAL #3   Title Pt and husband will demonstrate use of communication strategies to reduce frustration and communication breakdowns.    Time 8    Period Weeks    Status Achieved      SLP LONG TERM  GOAL #4   Title Pt will expand utterances by 2+ content words during a structured task (RET).    Time 4    Period Weeks    Status Achieved      SLP LONG TERM GOAL #5   Title Pt will create 2 different subjects and objects for a single verb to increase the specificity of nouns and verbs in sentences during VNeST across 4 sessions.    Time 4    Period Weeks    Status New    Target Date 12/04/20      Additional Long Term Goals   Additional Long Term Goals Yes      SLP LONG TERM GOAL #6   Title Pt will describe a picture using a subject-verb-object sentence independently across 4 sessions.    Time 4    Period Weeks    Status New    Target Date 12/04/20              Plan - 11/08/20 5732     Clinical Impression Statement See tx note. Pt reports she feels like her communication is improving and she is "slowing down" (not rushing herself).  Cont with current POC.    Speech Therapy Frequency 2x / week    Duration 4 weeks    Treatment/Interventions Compensatory strategies;Cueing hierarchy;Functional tasks;Patient/family education;Environmental controls;Multimodal communcation approach;Language facilitation;Compensatory techniques;Internal/external aids;SLP instruction and feedback    Potential to Achieve Goals Good    Consulted and Agree with Plan of Care Patient;Family member/caregiver    Family Member Consulted Husband             Patient will benefit from skilled therapeutic intervention in order to improve the following deficits and impairments:   Aphasia    Problem List Patient Active Problem List   Diagnosis Date Noted   Amnesia 08/02/2020   Acute CVA (cerebrovascular accident) ([place]) 04/07/2019   CVA (cerebral vascular accident) ([place]) 04/05/2019   TIA (transient ischemic attack) 04/05/2019   Endometrial cancer ([place]) 04/22/2017   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 Communication Sciences and Disorders  11/08/2020, 11:20  AM  [place]  [place], [place], [address] Phone: [phone]   Fax:  [phone]   Name: Shanavia Makela MRN: [id number] Date of Birth: 02-25-1945

## 2020-11-08 NOTE — Therapy (Deleted)
[place]. [place], [place], [address] Phone: [phone]   Fax:  [phone]  Speech Language Pathology Treatment  Patient Details  Name: Lisa [name] MRN: [id number] Date of Birth: 1945/08/07 Referring Provider (SLP): Cari Caraway MD   Encounter Date: 11/08/2020   End of Session - 11/08/20 0939     Date for SLP Re-Evaluation 12/05/20    Authorization - Visit Number 2    Authorization - Number of Visits 8    SLP Start Time 0930    SLP Stop Time  8676    SLP Time Calculation (min) 45 min    Activity Tolerance Patient tolerated treatment well             Past Medical History:  Diagnosis Date   Stroke ([place]) 04/06/2019   Uterine cancer ([place]) 01/25/2016    Past Surgical History:  Procedure Laterality Date   BREAST EXCISIONAL BIOPSY Left 1975   LOOP RECORDER INSERTION N/A 04/07/2019   Procedure: LOOP RECORDER INSERTION;  Surgeon: Evans Lance, MD;  Location: [facility];  Service: Cardiovascular;  Laterality: N/A;   TUBAL LIGATION  1970    There were no vitals filed for this visit.   Subjective Assessment - 11/08/20 0937     Subjective Pt was able to share about her husband's appointment.    Patient is accompained by: Family member    Currently in Pain? No/denies                   ADULT SLP TREATMENT - 11/08/20 0942       General Information   Behavior/Cognition Alert;Cooperative      Treatment Provided   Treatment provided Cognitive-Linquistic      Cognitive-Linquistic Treatment   Treatment focused on Aphasia;Other (comment)   All previous tx should say "aphasia" vs. cognition. SLP clicking error.   Skilled Treatment Pt reports success with communication in iADLs. She "gives herself enough time" when speaking with others. Pt participated in RET in order to practice expressing SVO sentence structure. She completed this task with modA. SLP noted that pt benefited  from a visual cue, "the", in order to begin a sentence.      Assessment / Recommendations / Plan   Plan Continue with current plan of care      Progression Toward Goals   Progression toward goals Progressing toward goals                SLP Short Term Goals - 11/06/20 0941       SLP SHORT TERM GOAL #1   Title Pt will provide +3 categorical descriptions (SFA) of single item independently.    Time 4    Period Weeks    Status Achieved    Target Date 11/04/20      SLP SHORT TERM GOAL #2   Title Pt will increase utterances by +1 content word using RET across 4 sessions.    Time 4    Period Weeks    Status Achieved    Target Date 11/04/20      SLP SHORT TERM GOAL #3   Title Pt will create 3 different subjects and objects for a single verb to increase the specificity of nouns and verbs in sentences during VNeST across 4 sessions.    Time 2    Period Weeks    Status New      SLP SHORT TERM GOAL #4   Title Pt will  describe a picture using a subject-verb-object sentence given minA across 4 sessions.    Time 2    Period Weeks    Status New    Target Date 11/20/20              SLP Long Term Goals - 11/06/20 0947       SLP LONG TERM GOAL #1   Title Pt and family will report successful use of word finding strategies to decrease anomia in conversation.    Time 8    Period Weeks    Status Achieved      SLP LONG TERM GOAL #2   Title Pt will have demonstrate <2 instances of anomia in a 20 minute conversation with SLP.    Time 8    Period Weeks    Status Deferred      SLP LONG TERM GOAL #3   Title Pt and husband will demonstrate use of communication strategies to reduce frustration and communication breakdowns.    Time 8    Period Weeks    Status Achieved      SLP LONG TERM GOAL #4   Title Pt will expand utterances by 2+ content words during a structured task (RET).    Time 4    Period Weeks    Status Achieved      SLP LONG TERM GOAL #5   Title Pt will  create 2 different subjects and objects for a single verb to increase the specificity of nouns and verbs in sentences during VNeST across 4 sessions.    Time 4    Period Weeks    Status New    Target Date 12/04/20      Additional Long Term Goals   Additional Long Term Goals Yes      SLP LONG TERM GOAL #6   Title Pt will describe a picture using a subject-verb-object sentence independently across 4 sessions.    Time 4    Period Weeks    Status New    Target Date 12/04/20              Plan - 11/08/20 2725     Clinical Impression Statement See tx note. Pt reports she feels like her communication is improving and she is "slowing down" (not rushing herself).  Cont with current POC.    Speech Therapy Frequency 2x / week    Duration 4 weeks    Treatment/Interventions Compensatory strategies;Cueing hierarchy;Functional tasks;Patient/family education;Environmental controls;Multimodal communcation approach;Language facilitation;Compensatory techniques;Internal/external aids;SLP instruction and feedback    Potential to Achieve Goals Good    Consulted and Agree with Plan of Care Patient;Family member/caregiver    Family Member Consulted Husband             Patient will benefit from skilled therapeutic intervention in order to improve the following deficits and impairments:   Aphasia    Problem List Patient Active Problem List   Diagnosis Date Noted   Amnesia 08/02/2020   Acute CVA (cerebrovascular accident) ([place]) 04/07/2019   CVA (cerebral vascular accident) ([place]) 04/05/2019   TIA (transient ischemic attack) 04/05/2019   Endometrial cancer ([place]) 04/22/2017   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    Lisa [name]  11/08/2020, 11:11 AM  [name], [place], [address] Phone: [phone]   Fax:  [phone]   Name: Lisa [name] MRN: [id number] Date of Birth: Aug 23, 1945

## 2020-11-13 ENCOUNTER — Other Ambulatory Visit: Payer: Self-pay

## 2020-11-13 ENCOUNTER — Ambulatory Visit: Payer: Medicare Other | Admitting: Speech Pathology

## 2020-11-13 DIAGNOSIS — R4701 Aphasia: Secondary | ICD-10-CM | POA: Diagnosis not present

## 2020-11-13 NOTE — Therapy (Signed)
[facility]. [place], [place], [address] Phone: [phone]   Fax:  [phone]  Speech Language Pathology Treatment  Patient Details  Name: Lisa [name] MRN: [id number] Date of Birth: November 14, 1945 Referring Provider (SLP): Cari Caraway MD   Encounter Date: 11/13/2020   End of Session - 11/13/20 0900     Date for SLP Re-Evaluation 12/05/20    Authorization - Visit Number 3    Authorization - Number of Visits 8    SLP Start Time 0845    SLP Stop Time  1021    SLP Time Calculation (min) 40 min    Activity Tolerance Patient tolerated treatment well             Past Medical History:  Diagnosis Date   Stroke ([place]) 04/06/2019   Uterine cancer ([place]) 01/25/2016    Past Surgical History:  Procedure Laterality Date   BREAST EXCISIONAL BIOPSY Left 1975   LOOP RECORDER INSERTION N/A 04/07/2019   Procedure: LOOP RECORDER INSERTION;  Surgeon: Evans Lance, MD;  Location: [facility];  Service: Cardiovascular;  Laterality: N/A;   TUBAL LIGATION  1970    There were no vitals filed for this visit.   Subjective Assessment - 11/13/20 0857     Subjective "I get flustered when I talk to my sister and I just shut down."    Currently in Pain? No/denies                   ADULT SLP TREATMENT - 11/13/20 0908       General Information   Behavior/Cognition Alert;Cooperative      Treatment Provided   Treatment provided Cognitive-Linquistic      Cognitive-Linquistic Treatment   Treatment focused on Aphasia;Other (comment)    Skilled Treatment SLP faciliated verbal expression using VNeST. Pt was able to generate subject verb object sentences with min-to-modA verbal and written cueing. She cont to benefit from sentence completion cues for word retrieval. Pt was able to generate grammatically correct sentences from the given SVO words independently.      Assessment / Recommendations / Plan    Plan Continue with current plan of care      Progression Toward Goals   Progression toward goals Progressing toward goals                SLP Short Term Goals - 11/06/20 0941       SLP SHORT TERM GOAL #1   Title Pt will provide +3 categorical descriptions (SFA) of single item independently.    Time 4    Period Weeks    Status Achieved    Target Date 11/04/20      SLP SHORT TERM GOAL #2   Title Pt will increase utterances by +1 content word using RET across 4 sessions.    Time 4    Period Weeks    Status Achieved    Target Date 11/04/20      SLP SHORT TERM GOAL #3   Title Pt will create 3 different subjects and objects for a single verb to increase the specificity of nouns and verbs in sentences during VNeST across 4 sessions.    Time 2    Period Weeks    Status New      SLP SHORT TERM GOAL #4   Title Pt will describe a picture using a subject-verb-object sentence given minA across 4 sessions.    Time 2  Period Weeks    Status New    Target Date 11/20/20              SLP Long Term Goals - 11/06/20 0947       SLP LONG TERM GOAL #1   Title Pt and family will report successful use of word finding strategies to decrease anomia in conversation.    Time 8    Period Weeks    Status Achieved      SLP LONG TERM GOAL #2   Title Pt will have demonstrate <2 instances of anomia in a 20 minute conversation with SLP.    Time 8    Period Weeks    Status Deferred      SLP LONG TERM GOAL #3   Title Pt and husband will demonstrate use of communication strategies to reduce frustration and communication breakdowns.    Time 8    Period Weeks    Status Achieved      SLP LONG TERM GOAL #4   Title Pt will expand utterances by 2+ content words during a structured task (RET).    Time 4    Period Weeks    Status Achieved      SLP LONG TERM GOAL #5   Title Pt will create 2 different subjects and objects for a single verb to increase the specificity of nouns and verbs  in sentences during VNeST across 4 sessions.    Time 4    Period Weeks    Status New    Target Date 12/04/20      Additional Long Term Goals   Additional Long Term Goals Yes      SLP LONG TERM GOAL #6   Title Pt will describe a picture using a subject-verb-object sentence independently across 4 sessions.    Time 4    Period Weeks    Status New    Target Date 12/04/20              Plan - 11/13/20 0901     Clinical Impression Statement See tx note. Pt reports she feels flustered when having a conversation with her sister and just "gives up". SLP and pt spoke using the following framework to navigate the communication breakdown: "Give me just a second", take 2 deep breaths,  "Okay, let me try again". Pt reports she feels this will be helpful. Cont with current POC.    Speech Therapy Frequency 2x / week    Duration 4 weeks    Treatment/Interventions Compensatory strategies;Cueing hierarchy;Functional tasks;Patient/family education;Environmental controls;Multimodal communcation approach;Language facilitation;Compensatory techniques;Internal/external aids;SLP instruction and feedback    Potential to Achieve Goals Good    Consulted and Agree with Plan of Care Patient;Family member/caregiver    Family Member Consulted Husband             Patient will benefit from skilled therapeutic intervention in order to improve the following deficits and impairments:   Aphasia    Problem List Patient Active Problem List   Diagnosis Date Noted   Amnesia 08/02/2020   Acute CVA (cerebrovascular accident) ([facility]) 04/07/2019   CVA (cerebral vascular accident) ([facility]) 04/05/2019   TIA (transient ischemic attack) 04/05/2019   Endometrial cancer ([facility]) 04/22/2017   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 [place], [place], [address] Phone:  [phone]   Fax:  [phone]   Name:  Lisa [name] MRN: [id number] Date of Birth: 1945/11/20

## 2020-11-14 NOTE — Progress Notes (Signed)
Carelink Summary Report / Loop Recorder 

## 2020-11-15 ENCOUNTER — Ambulatory Visit: Payer: Medicare Other | Admitting: Speech Pathology

## 2020-11-15 ENCOUNTER — Encounter: Payer: Self-pay | Admitting: Speech Pathology

## 2020-11-15 ENCOUNTER — Other Ambulatory Visit: Payer: Self-pay

## 2020-11-15 DIAGNOSIS — R4701 Aphasia: Secondary | ICD-10-CM | POA: Diagnosis not present

## 2020-11-15 NOTE — Therapy (Signed)
[facility]. [place], [place], [address] Phone: [phone]   Fax:  [phone]  Speech Language Pathology Treatment  Patient Details  Name: Lisa [name] MRN: [id number] Date of Birth: 22-Mar-1945 Referring Provider (SLP): Cari Caraway MD   Encounter Date: 11/15/2020   End of Session - 11/15/20 0940     Visit Number 19    Date for SLP Re-Evaluation 12/05/20    Authorization - Visit Number 4    Authorization - Number of Visits 8    SLP Start Time 0930    SLP Stop Time  1010    SLP Time Calculation (min) 40 min    Activity Tolerance Patient tolerated treatment well             Past Medical History:  Diagnosis Date   Stroke ([facility]) 04/06/2019   Uterine cancer ([facility]) 01/25/2016    Past Surgical History:  Procedure Laterality Date   BREAST EXCISIONAL BIOPSY Left 1975   LOOP RECORDER INSERTION N/A 04/07/2019   Procedure: LOOP RECORDER INSERTION;  Surgeon: Evans Lance, MD;  Location: [facility];  Service: Cardiovascular;  Laterality: N/A;   TUBAL LIGATION  1970    There were no vitals filed for this visit.   Subjective Assessment - 11/15/20 0940     Subjective "I just need to take a second."    Currently in Pain? No/denies                   ADULT SLP TREATMENT - 11/15/20 0954       General Information   Behavior/Cognition Alert;Cooperative      Treatment Provided   Treatment provided Cognitive-Linquistic      Cognitive-Linquistic Treatment   Treatment focused on Aphasia    Skilled Treatment Primed language center by asking object function of given word. Pt required minA verbal cues for accurate description. SLP facilitated verbal expression using Semantic Feature Analysis (SFA). Pt was able to complete SFA task with minA and prompting questions using expanding expression tool (EET).      Assessment / Recommendations / Plan   Plan Continue with current plan of care       Progression Toward Goals   Progression toward goals Progressing toward goals                SLP Short Term Goals - 11/06/20 0941       SLP SHORT TERM GOAL #1   Title Pt will provide +3 categorical descriptions (SFA) of single item independently.    Time 4    Period Weeks    Status Achieved    Target Date 11/04/20      SLP SHORT TERM GOAL #2   Title Pt will increase utterances by +1 content word using RET across 4 sessions.    Time 4    Period Weeks    Status Achieved    Target Date 11/04/20      SLP SHORT TERM GOAL #3   Title Pt will create 3 different subjects and objects for a single verb to increase the specificity of nouns and verbs in sentences during VNeST across 4 sessions.    Time 2    Period Weeks    Status New      SLP SHORT TERM GOAL #4   Title Pt will describe a picture using a subject-verb-object sentence given minA across 4 sessions.    Time 2    Period Weeks  Status New    Target Date 11/20/20              SLP Long Term Goals - 11/06/20 0947       SLP LONG TERM GOAL #1   Title Pt and family will report successful use of word finding strategies to decrease anomia in conversation.    Time 8    Period Weeks    Status Achieved      SLP LONG TERM GOAL #2   Title Pt will have demonstrate <2 instances of anomia in a 20 minute conversation with SLP.    Time 8    Period Weeks    Status Deferred      SLP LONG TERM GOAL #3   Title Pt and husband will demonstrate use of communication strategies to reduce frustration and communication breakdowns.    Time 8    Period Weeks    Status Achieved      SLP LONG TERM GOAL #4   Title Pt will expand utterances by 2+ content words during a structured task (RET).    Time 4    Period Weeks    Status Achieved      SLP LONG TERM GOAL #5   Title Pt will create 2 different subjects and objects for a single verb to increase the specificity of nouns and verbs in sentences during VNeST across 4  sessions.    Time 4    Period Weeks    Status New    Target Date 12/04/20      Additional Long Term Goals   Additional Long Term Goals Yes      SLP LONG TERM GOAL #6   Title Pt will describe a picture using a subject-verb-object sentence independently across 4 sessions.    Time 4    Period Weeks    Status New    Target Date 12/04/20              Plan - 11/15/20 0941     Clinical Impression Statement See tx note. Pt reports she cont to be frustrated with word finding instances; however, these are becoming less frequent. Cont with current POC.    Speech Therapy Frequency 2x / week    Duration 4 weeks    Treatment/Interventions Compensatory strategies;Cueing hierarchy;Functional tasks;Patient/family education;Environmental controls;Multimodal communcation approach;Language facilitation;Compensatory techniques;Internal/external aids;SLP instruction and feedback    Potential to Achieve Goals Good    Consulted and Agree with Plan of Care Patient;Family member/caregiver    Family Member Consulted Husband             Patient will benefit from skilled therapeutic intervention in order to improve the following deficits and impairments:   Aphasia    Problem List Patient Active Problem List   Diagnosis Date Noted   Amnesia 08/02/2020   Acute CVA (cerebrovascular accident) ([facility]) 04/07/2019   CVA (cerebral vascular accident) ([facility]) 04/05/2019   TIA (transient ischemic attack) 04/05/2019   Endometrial cancer ([facility]) 04/22/2017   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 [place], [place], [address] Phone: [phone]   Fax:  [phone]   Name: Lisa [name] MRN: [id number] Date of Birth: Apr 25, 1945

## 2020-11-20 ENCOUNTER — Ambulatory Visit: Payer: Medicare Other | Attending: Family Medicine | Admitting: Speech Pathology

## 2020-11-20 ENCOUNTER — Encounter: Payer: Self-pay | Admitting: Speech Pathology

## 2020-11-20 ENCOUNTER — Other Ambulatory Visit: Payer: Self-pay

## 2020-11-20 DIAGNOSIS — R4701 Aphasia: Secondary | ICD-10-CM | POA: Diagnosis present

## 2020-11-20 NOTE — Progress Notes (Addendum)
Follow Up Note: Gyn-Onc  Lisa [name] 75 y.o. 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  There is not a comment in the pathology report of lymphovascular space invasion.  11 lymph nodes were sampled including 4 from the left pelvic 7 from the right pelvic 1 from the left aortic and 2 from the right aortic.  All were negative for apparent metastases.  The ovaries tubes and cervix were free of disease   03/10/2016 Genetic Testing    intact nuclear expression of mL H1 [place] 2 and [place] 6 and PMS 2 reflecting no loss of expression of mismatch repair proteins and a microsatellite stable tumor.   04/22/2017 Initial Diagnosis   Endometrial cancer ([place])      Interval History: She denies any vaginal bleeding, abdominal/pelvic pain, cough, lethargy or abdominal distention.     Review of Systems  Review of Systems  Constitutional:  Negative for malaise/fatigue and weight loss.  Respiratory:  Negative for shortness of breath and wheezing.   Cardiovascular:  Negative for chest pain and leg swelling.  Gastrointestinal:  Negative for abdominal pain, blood in stool, constipation, nausea and vomiting.  Genitourinary:  Negative for dysuria, frequency, hematuria and urgency.  Musculoskeletal:  Negative for joint pain and myalgias.  Neurological:  Negative for weakness.  Psychiatric/Behavioral:  Negative for depression. The patient does not have insomnia.     Current Meds:  Outpatient Encounter Medications as of 11/21/2020  Medication Sig    aspirin EC 81 MG EC tablet Take 1 tablet (81 mg total) by mouth daily.   atorvastatin (LIPITOR) 40 MG tablet Take 1 tablet (40 mg total) by mouth daily at 6 PM.   Cholecalciferol (VITAMIN D3 PO) Take 25 mg by mouth daily.   fexofenadine (ALLEGRA) 180 MG tablet Take 180 mg by mouth daily.   Magnesium Oxide 400 MG CAPS Take 1 capsule by mouth daily.   Multiple Vitamins-Minerals (CENTRUM SILVER 50+WOMEN PO) Take 1 tablet by mouth daily.   SUPER B COMPLEX/C PO Take 1 tablet by mouth daily.   vitamin B-12 (CYANOCOBALAMIN) 100 MCG tablet Take 100 mcg by mouth daily.   No facility-administered encounter medications on file as of 11/21/2020.    Allergy:  Allergies  Allergen Reactions   Codeine Nausea And Vomiting   Tylenol [Acetaminophen] Hives    Social Hx:   Social History   Socioeconomic History   Marital status: Married    Spouse name: Not on file   Number of children: Not on file   Years of education: Not on file   Highest education level: Not on file  Occupational History   Not on file  Tobacco Use   Smoking status: Never   Smokeless tobacco: Never  Vaping Use   Vaping Use: Never used  Substance and Sexual Activity   Alcohol use: Yes    Comment: occasional drinker   Drug use: Never   Sexual activity: Not on file  Other Topics Concern   Not on file  Social History Narrative   Not on file   Social Determinants of Health   Financial Resource Strain: Not on file  Food Insecurity: Not on file  Transportation Needs: Not on file  Physical Activity: Not on file  Stress: Not on file  Social Connections: Not on file  Intimate Partner Violence: Not on file    Past Surgical Hx:  Past Surgical History:  Procedure Laterality Date   BREAST EXCISIONAL BIOPSY Left 1975   LOOP RECORDER INSERTION N/A 04/07/2019   Procedure: [place];  Surgeon: Evans Lance, MD;  Location: [facility];  Service: Cardiovascular;  Laterality: N/A;   TUBAL LIGATION  1970     Past Medical Hx:  Past Medical History:  Diagnosis Date   Stroke ([place]) 04/06/2019   Uterine cancer ([place] [facility]) 01/25/2016    Family Hx:  Family History  Problem Relation Age of Onset   Hypertension Mother    Stroke Mother    Hypertension Father     Vitals:  BP (!) 147/75 (BP Location: Right Arm, Patient Position: Sitting)   Pulse 93   Temp 97.8 F (36.6 C)   Resp 17   Wt 172 lb 6 oz (78.2 kg)   SpO2 100%   BMI 32.57 kg/m   Physical Exam: Physical Exam Exam conducted with a chaperone present.  Constitutional:      General: She is not in acute distress. Cardiovascular:     Rate and Rhythm: Normal rate and regular rhythm.  Pulmonary:     Effort: Pulmonary effort is normal.     Breath sounds: Normal breath sounds. No wheezing or rhonchi.  Abdominal:     Palpations: Abdomen is soft.     Tenderness: There is no abdominal tenderness. There is no right CVA tenderness or left CVA tenderness.     Hernia: No hernia is present.  Genitourinary:    Urethra: No urethral lesion.     Vagina: No bleeding or lesions.     Comments: Mild erythematous, waxy plaque in hour glass configuration.  Buried clitoris.  Resorbed labia minora. Musculoskeletal:     Cervical back: Neck supple.     Right lower leg: No edema.     Left lower leg: No edema.  Lymphadenopathy:     Upper Body:     Right upper body: No supraclavicular adenopathy.     Left upper body: No supraclavicular adenopathy.     Lower Body: No right inguinal adenopathy. No left inguinal adenopathy.  Skin:    Findings: No rash.  Neurological:     Mental Status: She is oriented to person, place, and time.     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 MSI stable. Negative symptom review, normal exam.  No evidence of recurrence  >annual visits if she remains disease free  .     Lahoma Crocker, MD 11/20/2020, 3:02 PM

## 2020-11-20 NOTE — Therapy (Addendum)
[facility]. [place], [place], [address] Phone: [phone]   Fax:  [phone]  Speech Language Pathology Treatment  Patient Details  Name: Lisa [name] MRN: [id number] Date of Birth: 1946/01/15 Referring Provider (SLP): Cari Caraway MD   Encounter Date: 11/20/2020   End of Session - 11/20/20 1236     Visit Number 20    Date for SLP Re-Evaluation 12/05/20    Authorization - Visit Number 5    Authorization - Number of Visits 8    SLP Start Time 6301    SLP Stop Time  6010    SLP Time Calculation (min) 45 min    Activity Tolerance Patient tolerated treatment well             Past Medical History:  Diagnosis Date   Stroke ([facility] [place]) 04/06/2019   Uterine cancer ([facility]) 01/25/2016    Past Surgical History:  Procedure Laterality Date   BREAST EXCISIONAL BIOPSY Left 1975   LOOP RECORDER INSERTION N/A 04/07/2019   Procedure: LOOP RECORDER INSERTION;  Surgeon: Evans Lance, MD;  Location: [facility];  Service: Cardiovascular;  Laterality: N/A;   TUBAL LIGATION  1970    There were no vitals filed for this visit.   Subjective Assessment - 11/20/20 1235     Subjective "It's been going okay. Sometimes I get flustered. With my sister you know."    Currently in Pain? No/denies                   ADULT SLP TREATMENT - 11/20/20 1619       General Information   Behavior/Cognition Cooperative;Alert      Treatment Provided   Treatment provided Cognitive-Linquistic      Cognitive-Linquistic Treatment   Treatment focused on Aphasia    Skilled Treatment SLP primed language center with expressive language task where pt compared objects with 2 similarities and 1 difference with min verbal cues. Pt was instructed to describe pictures using SVO stucture. Pt required modA to produce SVO sentences. SLP noted pt benefited from a visual cue of SVO structure (The __ is ___) and verbal cues. Pt also  produced 3 sentences using VNeST and met goal requiring less than two cues.      Assessment / Recommendations / Plan   Plan Continue with current plan of care      Progression Toward Goals   Progression toward goals Progressing toward goals                SLP Short Term Goals - 11/20/20 1243       SLP SHORT TERM GOAL #1   Title Pt will provide +3 categorical descriptions (SFA) of single item independently.    Time 4    Period Weeks    Status Achieved    Target Date 11/04/20      SLP SHORT TERM GOAL #2   Title Pt will increase utterances by +1 content word using RET across 4 sessions.    Time 4    Period Weeks    Status Achieved    Target Date 11/04/20      SLP SHORT TERM GOAL #3   Title Pt will create 2 different subjects and objects for a single verb to increase the specificity of nouns and verbs in sentences during VNeST across 4 sessions.   Error: STG was supposed to be 2 and LTG was supposed to be 3.   Time 2  Period Weeks    Status Achieved      SLP SHORT TERM GOAL #4   Title Pt will describe a picture using a subject-verb-object sentence given minA across 4 sessions.    Time 2    Period Weeks    Status New    Target Date 11/20/20              SLP Long Term Goals - 11/06/20 0947       SLP LONG TERM GOAL #1   Title Pt and family will report successful use of word finding strategies to decrease anomia in conversation.    Time 8    Period Weeks    Status Achieved      SLP LONG TERM GOAL #2   Title Pt will have demonstrate <2 instances of anomia in a 20 minute conversation with SLP.    Time 8    Period Weeks    Status Deferred      SLP LONG TERM GOAL #3   Title Pt and husband will demonstrate use of communication strategies to reduce frustration and communication breakdowns.    Time 8    Period Weeks    Status Achieved      SLP LONG TERM GOAL #4   Title Pt will expand utterances by 2+ content words during a structured task (RET).    Time 4     Period Weeks    Status Achieved      SLP LONG TERM GOAL #5   Title Pt will create 2 different subjects and objects for a single verb to increase the specificity of nouns and verbs in sentences during VNeST across 4 sessions.    Time 4    Period Weeks    Status New    Target Date 12/04/20      Additional Long Term Goals   Additional Long Term Goals Yes      SLP LONG TERM GOAL #6   Title Pt will describe a picture using a subject-verb-object sentence independently across 4 sessions.    Time 4    Period Weeks    Status New    Target Date 12/04/20              Plan - 11/20/20 1236     Clinical Impression Statement See tx note. Pt reported she had difficulty when speaking with her sister on the phone; however, husband reported he did not feel like she had the amount of difficulty she felt like she had. Pt husband reported she did not have any significant word finding deficits during the conversation.   Cont with current POC.    Speech Therapy Frequency 2x / week    Duration 4 weeks    Treatment/Interventions Compensatory strategies;Cueing hierarchy;Functional tasks;Patient/family education;Environmental controls;Multimodal communcation approach;Language facilitation;Compensatory techniques;Internal/external aids;SLP instruction and feedback    Potential to Achieve Goals Good    Consulted and Agree with Plan of Care Patient;Family member/caregiver    Family Member Consulted Husband             Patient will benefit from skilled therapeutic intervention in order to improve the following deficits and impairments:   Aphasia    Problem List Patient Active Problem List   Diagnosis Date Noted   Amnesia 08/02/2020   Acute CVA (cerebrovascular accident) ([place]) 04/07/2019   CVA (cerebral vascular accident) ([place]) 04/05/2019   TIA (transient ischemic attack) 04/05/2019   Endometrial cancer ([place]) 04/22/2017   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 [facility]  11/20/2020, 4:27 PM  Speech Therapy Progress Note  Dates of Reporting Period:  10/11/20 to present  Subjective Statement: Pt is willing to participate in sessions. She exhibits motivation and responds quickly to cueing given by the SLP.   Objective: See previous tx note  Goal Update: See tx note. Pt is making progress towards goals.  Plan: To cont to facilitate language output via the following language tx: RET, SFA, VNeST  Reason Skilled Services are Required: : SLP rec skilled ST services to address expressive aphasia and maximize functional communication.      [facility]. [place], [place], [address] Phone: [phone]   Fax:  [phone]   Name: Demetri Kerman MRN: [id number] Date of Birth: [birth year]

## 2020-11-20 NOTE — Assessment & Plan Note (Addendum)
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 MSI stable. Negative symptom review, normal exam.  No evidence of recurrence  >annual visits if she remains disease free  .

## 2020-11-21 ENCOUNTER — Inpatient Hospital Stay: Payer: Medicare Other | Attending: Obstetrics & Gynecology | Admitting: Obstetrics & Gynecology

## 2020-11-21 VITALS — BP 147/75 | HR 93 | Temp 97.8°F | Resp 17 | Wt 172.4 lb

## 2020-11-21 DIAGNOSIS — Z90710 Acquired absence of both cervix and uterus: Secondary | ICD-10-CM | POA: Insufficient documentation

## 2020-11-21 DIAGNOSIS — Z90722 Acquired absence of ovaries, bilateral: Secondary | ICD-10-CM | POA: Diagnosis not present

## 2020-11-21 DIAGNOSIS — Z8542 Personal history of malignant neoplasm of other parts of uterus: Secondary | ICD-10-CM | POA: Diagnosis present

## 2020-11-21 DIAGNOSIS — C541 Malignant neoplasm of endometrium: Secondary | ICD-10-CM

## 2020-11-21 NOTE — Patient Instructions (Signed)
Return in 1 yr 

## 2020-11-22 ENCOUNTER — Ambulatory Visit (INDEPENDENT_AMBULATORY_CARE_PROVIDER_SITE_OTHER): Payer: Medicare Other | Admitting: Neurology

## 2020-11-22 ENCOUNTER — Encounter: Payer: Self-pay | Admitting: Obstetrics & Gynecology

## 2020-11-22 ENCOUNTER — Encounter: Payer: Self-pay | Admitting: Neurology

## 2020-11-22 VITALS — BP 166/82 | HR 85 | Ht 61.0 in | Wt 174.0 lb

## 2020-11-22 DIAGNOSIS — I639 Cerebral infarction, unspecified: Secondary | ICD-10-CM | POA: Diagnosis not present

## 2020-11-22 NOTE — Progress Notes (Signed)
[facility] [address] [place]. [place] [address] [phone]       STROKE FOLLOW UP NOTE  Lisa [name] Date of Birth:  07-Jun-1945 Medical Record Number:  [id number]   Reason for Referral: stroke follow up    SUBJECTIVE:    Update 11/22/2020: Patient is seen for follow-up today following recent admission for recurrent stroke in July 2022.  She is accompanied by her husband.  History is obtained from them and review of electronic medical records and I have personally reviewed available pertinent imaging films in PACS.  She presented on 08/02/2020 with waking up with difficulty speaking and communicating.  She is sat in the car and did not know how to start the car and ultimately the husband drove her to the [place] ED for evaluation.  The episode lasted about an hour and a half and then she gradually got better.  She was seen by neuro hospitalist and was found to have mild word finding difficulties.  MRI scan of the brain showed a left ACA infarct with mild cytotoxic edema and multiple old infarcts and chronic changes of small vessel disease.  2D echo showed ejection fraction of 60% no cardiac source of embolism.  LDL cholesterol 74 mg percent.  Hemoglobin A1c was 5.8.  Loop recorder interrogation did not show any paroxysmal A. fib.  She underwent CT scan of the chest abdomen pelvis and no evidence of malignancy was found.  She is continue on dual antiplatelet therapy and advised to change to Plavix after 3 weeks but currently it appears she is taking aspirin 81 mg instead.  She is done well.  She has very minimal word finding difficulties and occasional hesitancy but is almost back to baseline.  She has had no recurrent stroke or TIA symptoms.  She is tolerating Lipitor well without muscle aches and pains.  Her blood pressure is well controlled.  She has no new complaints today.  She has prior history of bilateral MCA infarcts and March 2021 with  extensive work-up for source of embolism being negative and had loop recorder inserted but so for A. fib has not been found.     Prior visit 09/27/2019, Lisa [name] returns for stroke follow-up  Reports she has been stable since prior visit without residual stroke deficits and denies new or reoccurring stroke/TIA symptoms  Remains on aspirin 81 mg daily without bleeding or bruising Remains on atorvastatin 40 mg but reports only taking 1-2 times weekly due to concern of possible statin side effects but has not actually experienced side effects personally Lipid panel obtained after prior visit on 05/10/2019 which showed LDL 59 Blood pressure today 151/76 -monitors at home and typically stable  Loop recorder has not shown atrial fibrillation thus far  No concerns at this time    History provided for reference purposes only Update 05/10/2019 JM: Lisa [name] is being seen for hospital follow-up accompanied by her husband.  She has been stable from a stroke standpoint without residual deficits or new/reoccurring stroke/TIA symptoms.  Completed 3 weeks DAPT and continues on aspirin alone without bleeding or bruising.  Continues on atorvastatin 40 mg daily without myalgias.  Blood pressure today 130/86.  Loop recorder has not shown atrial fibrillation thus far.  No concerns at this time.  Stroke admission 04/05/2019 Lisa [name] is a 75 y.o. female with history of uterine cancer  who presented on 04/05/2019 with transient dizziness, L>R leg weakness and B arm weakness  while walking in [employer].  Stroke work-up revealed 3-4 punctate bilateral MCA/ACA watershed infarcts embolic secondary to unknown source.  Loop recorder placed to rule out atrial fibrillation as potential etiology.  Recommended DAPT for 3 weeks and aspirin alone as previously not on antithrombotic.  No prior history of HTN.  LDL 139 initiate atorvastatin 40 mg daily.  No evidence or history of DM with A1c 5.7.  Other stroke  risk factors include advanced age, EtOH use, family history of stroke and prior strokes on imaging.  She was discharged home in stable condition without therapy needs.  Stroke: 3-4 punctate bilateral MCA/ACA watershed infarcts embolic secondary to unknown source Code Stroke CT head No acute abnormality. ASPECTS 10.    CTA head & neck no LVO. Mild R M1/MCA stenosis. Small distal supraclinoid R ICA aneurysm.  MRI  3-4 punctate B MCA/ACA watershed infarcts. Multiple old infarct. Chronic ischemic microangiopathy. LE Doppler  No DVT 2D Echo EF 65-70% Loop recorder placed LDL 139 -initiated atorvastatin 40 mg daily HgbA1c 5.7 No HTN hx Lovenox 40 mg sq daily for VTE prophylaxis No antithrombotic prior to admission, now on aspirin 81 mg daily and clopidogrel 75 mg daily. Continue DAPT x 3 weeks then aspirin alone.   Therapy recommendations:   No therapy needs Disposition:   Home      ROS:   14 system review of systems performed and negative with exception of no complaints  PMH:  Past Medical History:  Diagnosis Date   Stroke ([place]) 04/06/2019   Uterine cancer ([place]) 01/25/2016    PSH:  Past Surgical History:  Procedure Laterality Date   BREAST EXCISIONAL BIOPSY Left 1975   LOOP RECORDER INSERTION N/A 04/07/2019   Procedure: LOOP RECORDER INSERTION;  Surgeon: Evans Lance, MD;  Location: [place] [facility];  Service: Cardiovascular;  Laterality: N/A;   TUBAL LIGATION  1970    Social History:  Social History   Socioeconomic History   Marital status: Married    Spouse name: Not on file   Number of children: Not on file   Years of education: Not on file   Highest education level: Not on file  Occupational History   Not on file  Tobacco Use   Smoking status: Never   Smokeless tobacco: Never  Vaping Use   Vaping Use: Never used  Substance and Sexual Activity   Alcohol use: Yes    Comment: occasional drinker   Drug use: Never   Sexual activity: Not on file  Other Topics  Concern   Not on file  Social History Narrative   Not on file   Social Determinants of Health   Financial Resource Strain: Not on file  Food Insecurity: Not on file  Transportation Needs: Not on file  Physical Activity: Not on file  Stress: Not on file  Social Connections: Not on file  Intimate Partner Violence: Not on file    Family History:  Family History  Problem Relation Age of Onset   Hypertension Mother    Stroke Mother    Hypertension Father     Medications:   Current Outpatient Medications on File Prior to Visit  Medication Sig Dispense Refill   aspirin EC 81 MG EC tablet Take 1 tablet (81 mg total) by mouth daily.     atorvastatin (LIPITOR) 40 MG tablet Take 1 tablet (40 mg total) by mouth daily at 6 PM. 90 tablet 1   Cholecalciferol (VITAMIN D3 PO) Take 25 mg by mouth daily.  clopidogrel (PLAVIX) 75 MG tablet Take 75 mg by mouth daily.     fexofenadine (ALLEGRA) 180 MG tablet Take 180 mg by mouth daily.     Magnesium Oxide 400 MG CAPS Take 1 capsule by mouth daily.     Multiple Vitamins-Minerals (CENTRUM SILVER 50+WOMEN PO) Take 1 tablet by mouth daily.     SUPER B COMPLEX/C PO Take 1 tablet by mouth daily.     vitamin B-12 (CYANOCOBALAMIN) 100 MCG tablet Take 100 mcg by mouth daily.     No current facility-administered medications on file prior to visit.    Allergies:   Allergies  Allergen Reactions   Codeine Nausea And Vomiting   Tylenol [Acetaminophen] Hives      OBJECTIVE:  Physical Exam  Vitals:   11/22/20 0938  BP: (!) 166/82  Pulse: 85  Weight: 174 lb (78.9 kg)  Height: 5\' 1"  (1.549 m)   Body mass index is 32.88 kg/m. No results found.   Physical exam  General: well developed, well nourished, very pleasant elderly Caucasian female, seated, in no evident distress Head: head normocephalic and atraumatic.   Neck: supple with no carotid or supraclavicular bruits Cardiovascular: regular rate and rhythm, no murmurs Musculoskeletal:  no deformity Skin:  no rash/petichiae Vascular:  Normal pulses all extremities   Neurologic Exam Mental Status: Awake and fully alert.   Fluent speech and language.  No paraphasic errors.  Able to name repeat comprehend quite well.  Oriented to place and time. Recent and remote memory intact. Attention span, concentration and fund of knowledge appropriate. Mood and affect appropriate.  Cranial Nerves: Pupils equal, briskly reactive to light. Extraocular movements full without nystagmus. Visual fields full to confrontation. Hearing intact. Facial sensation intact. Face, tongue, palate moves normally and symmetrically.  Motor: Normal bulk and tone. Normal strength in all tested extremity muscles. Sensory.: intact to touch , pinprick , position and vibratory sensation.  Coordination: Rapid alternating movements normal in all extremities. Finger-to-nose and heel-to-shin performed accurately bilaterally. Gait and Station: Arises from chair without difficulty. Stance is normal. Gait demonstrates normal stride length and balance Reflexes: 1+ and symmetric. Toes downgoing.   NIH stroke scale 0 Modified Rankin score 1    ASSESSMENT: Lisa [name] is a 75 y.o. year old female presented with transient dizziness, L>R leg weakness and bilateral arm weakness on 04/05/2019 with stroke work-up revealing 3-4 punctate bilateral MCA/ACA watershed infarcts embolic secondary to unknown source therefore loop recorder placed but no A. fib found so far.  .   Vascular risk factors include HLD, mild intracranial stenosis, prior stroke on imaging and advanced age.  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  She is doing well except for mild residual speech and word finding difficulties     PLAN:  I had a long d/w patient and her husband about her recent recurrent cryptogenic stroke, risk for recurrent stroke/TIAs, personally independently reviewed imaging  studies and stroke evaluation results and answered questions.Continue aspirin 81 mg daily  for secondary stroke prevention and maintain strict control of hypertension with blood pressure goal below 130/90, diabetes with hemoglobin A1c goal below 6.5% and lipids with LDL cholesterol goal below 70 mg/dL. I also advised the patient to eat a healthy diet with plenty of whole grains, cereals, fruits and vegetables, exercise regularly and maintain ideal body weight .consider participation in the [place] trial for stroke prevention and patient will be given information to review and decide.  Followup  in the future with me in 6 months or call earlier if necessary.  Greater than 50% time during this 45-minute  p visit was spent on counseling and coordination of care about her recurrent cryptogenic stroke and discussion of stroke prevention treatment and answering questions.    Antony Contras, MD  [facility] [address] [place],  [address]  Phone [phone] Fax [phone] Note: This document was prepared with digital dictation and possible smart phrase technology. Any transcriptional errors that result from this process are unintentional.

## 2020-11-22 NOTE — Patient Instructions (Signed)
I had a long d/w patient and her husband about her recent recurrent cryptogenic stroke, risk for recurrent stroke/TIAs, personally independently reviewed imaging studies and stroke evaluation results and answered questions.Continue aspirin 81 mg daily  for secondary stroke prevention and maintain strict control of hypertension with blood pressure goal below 130/90, diabetes with hemoglobin A1c goal below 6.5% and lipids with LDL cholesterol goal below 70 mg/dL. I also advised the patient to eat a healthy diet with plenty of whole grains, cereals, fruits and vegetables, exercise regularly and maintain ideal body weight .consider participation in the [place] trial for stroke prevention and patient will be given information to review and decide.  Followup in the future with me in 6 months or call earlier if necessary. Stroke Prevention Some medical conditions and behaviors are associated with a higher chance of having a stroke. You can help prevent a stroke by making nutrition, lifestyle, and other changes, including managing any medical conditions you may have. What nutrition changes can be made?  Eat healthy foods. You can do this by: Choosing foods high in fiber, such as fresh fruits and vegetables and whole grains. Eating at least 5 or more servings of fruits and vegetables a day. Try to fill half of your plate at each meal with fruits and vegetables. Choosing lean protein foods, such as lean cuts of meat, poultry without skin, fish, tofu, beans, and nuts. Eating low-fat dairy products. Avoiding foods that are high in salt (sodium). This can help lower blood pressure. Avoiding foods that have saturated fat, trans fat, and cholesterol. This can help prevent high cholesterol. Avoiding processed and premade foods. Follow your health care provider's specific guidelines for losing weight, controlling high blood pressure (hypertension), lowering high cholesterol, and managing diabetes. These may  include: Reducing your daily calorie intake. Limiting your daily sodium intake to 1,500 milligrams (mg). Using only healthy fats for cooking, such as olive oil, canola oil, or sunflower oil. Counting your daily carbohydrate intake. What lifestyle changes can be made? Maintain a healthy weight. Talk to your health care provider about your ideal weight. Get at least 30 minutes of moderate physical activity at least 5 days a week. Moderate activity includes brisk walking, biking, and swimming. Do not use any products that contain nicotine or tobacco, such as cigarettes and e-cigarettes. If you need help quitting, ask your health care provider. It may also be helpful to avoid exposure to secondhand smoke. Limit alcohol intake to no more than 1 drink a day for nonpregnant women and 2 drinks a day for men. One drink equals 12 oz of beer, 5 oz of wine, or 1 oz of hard liquor. Stop any illegal drug use. Avoid taking birth control pills. Talk to your health care provider about the risks of taking birth control pills if: You are over 61 years old. You smoke. You get migraines. You have ever had a blood clot. What other changes can be made? Manage your cholesterol levels. Eating a healthy diet is important for preventing high cholesterol. If cholesterol cannot be managed through diet alone, you may also need to take medicines. Take any prescribed medicines to control your cholesterol as told by your health care provider. Manage your diabetes. Eating a healthy diet and exercising regularly are important parts of managing your blood sugar. If your blood sugar cannot be managed through diet and exercise, you may need to take medicines. Take any prescribed medicines to control your diabetes as told by your health care provider. Control your  hypertension. To reduce your risk of stroke, try to keep your blood pressure below 130/80. Eating a healthy diet and exercising regularly are an important part of  controlling your blood pressure. If your blood pressure cannot be managed through diet and exercise, you may need to take medicines. Take any prescribed medicines to control hypertension as told by your health care provider. Ask your health care provider if you should monitor your blood pressure at home. Have your blood pressure checked every year, even if your blood pressure is normal. Blood pressure increases with age and some medical conditions. Get evaluated for sleep disorders (sleep apnea). Talk to your health care provider about getting a sleep evaluation if you snore a lot or have excessive sleepiness. Take over-the-counter and prescription medicines only as told by your health care provider. Aspirin or blood thinners (antiplatelets or anticoagulants) may be recommended to reduce your risk of forming blood clots that can lead to stroke. Make sure that any other medical conditions you have, such as atrial fibrillation or atherosclerosis, are managed. What are the warning signs of a stroke? The warning signs of a stroke can be easily remembered as BEFAST. B is for balance. Signs include: Dizziness. Loss of balance or coordination. Sudden trouble walking. E is for eyes. Signs include: A sudden change in vision. Trouble seeing. F is for face. Signs include: Sudden weakness or numbness of the face. The face or eyelid drooping to one side. A is for arms. Signs include: Sudden weakness or numbness of the arm, usually on one side of the body. S is for speech. Signs include: Trouble speaking (aphasia). Trouble understanding. T is for time. These symptoms may represent a serious problem that is an emergency. Do not wait to see if the symptoms will go away. Get medical help right away. Call your local emergency services (911 in the [place] Do not drive yourself to the hospital. Other signs of stroke may include: A sudden, severe headache with no known cause. Nausea or  vomiting. Seizure. Where to find more information For more information, visit: American Stroke Association: www.strokeassociation.org National Stroke Association: www.stroke.org Summary You can prevent a stroke by eating healthy, exercising, not smoking, limiting alcohol intake, and managing any medical conditions you may have. Do not use any products that contain nicotine or tobacco, such as cigarettes and e-cigarettes. If you need help quitting, ask your health care provider. It may also be helpful to avoid exposure to secondhand smoke. Remember BEFAST for warning signs of stroke. Get help right away if you or a loved one has any of these signs. This information is not intended to replace advice given to you by your health care provider. Make sure you discuss any questions you have with your health care provider. Document Revised: 12/19/2016 Document Reviewed: 02/12/2016 Elsevier Patient Education  2021 [employer].

## 2020-11-23 ENCOUNTER — Encounter: Payer: Self-pay | Admitting: Speech Pathology

## 2020-11-23 ENCOUNTER — Other Ambulatory Visit: Payer: Self-pay

## 2020-11-23 ENCOUNTER — Ambulatory Visit: Payer: Medicare Other | Admitting: Speech Pathology

## 2020-11-23 DIAGNOSIS — R4701 Aphasia: Secondary | ICD-10-CM

## 2020-11-23 NOTE — Therapy (Signed)
[place] [place], [place], [address] Phone: [phone]   Fax:  [phone]  Speech Language Pathology Treatment  Patient Details  Name: Lisa [name] MRN: [id number] Date of Birth: 1945-12-25 Referring Provider (SLP): Cari Caraway MD   Encounter Date: 11/23/2020   End of Session - 11/23/20 1018     Visit Number 21    Date for SLP Re-Evaluation 12/05/20    Authorization - Visit Number 6    Authorization - Number of Visits 8    SLP Start Time 6759    SLP Stop Time  1055    SLP Time Calculation (min) 40 min    Activity Tolerance Patient tolerated treatment well             Past Medical History:  Diagnosis Date   Stroke ([place]) 04/06/2019   Uterine cancer ([place]) 01/25/2016    Past Surgical History:  Procedure Laterality Date   BREAST EXCISIONAL BIOPSY Left 1975   LOOP RECORDER INSERTION N/A 04/07/2019   Procedure: LOOP RECORDER INSERTION;  Surgeon: Evans Lance, MD;  Location: [facility];  Service: Cardiovascular;  Laterality: N/A;   TUBAL LIGATION  1970    There were no vitals filed for this visit.   Subjective Assessment - 11/23/20 1017     Subjective "I'm fine. I fell out of bed." "I did not not hit my head."    Currently in Pain? No/denies                   ADULT SLP TREATMENT - 11/23/20 1034       General Information   Behavior/Cognition Cooperative;Alert      Treatment Provided   Treatment provided Cognitive-Linquistic      Cognitive-Linquistic Treatment   Treatment focused on Aphasia    Skilled Treatment SLP cont practice using word finding strategies to assess generalization of strategies in conversation. Pt required minA to use strategies in conversation. Pt benefited from a written (visual) cue. Most common strategies used re: delay and describing. Pt required encouragment to not "shut down".      Assessment / Recommendations / Plan   Plan Continue  with current plan of care      Progression Toward Goals   Progression toward goals Progressing toward goals                SLP Short Term Goals - 11/23/20 1032       SLP SHORT TERM GOAL #1   Title Pt will provide +3 categorical descriptions (SFA) of single item independently.    Time 4    Period Weeks    Status Achieved    Target Date 11/04/20      SLP SHORT TERM GOAL #2   Title Pt will increase utterances by +1 content word using RET across 4 sessions.    Time 4    Period Weeks    Status Achieved    Target Date 11/04/20      SLP SHORT TERM GOAL #3   Title Pt will create 2 different subjects and objects for a single verb to increase the specificity of nouns and verbs in sentences during VNeST across 4 sessions.   Error: STG was supposed to be 2 and LTG was supposed to be 3.   Time 2    Period Weeks    Status Achieved      SLP SHORT TERM GOAL #4   Title Pt will describe a  picture using a subject-verb-object sentence given minA across 4 sessions.    Time 2    Period Weeks    Status Achieved    Target Date 11/20/20              SLP Long Term Goals - 11/06/20 0947       SLP LONG TERM GOAL #1   Title Pt and family will report successful use of word finding strategies to decrease anomia in conversation.    Time 8    Period Weeks    Status Achieved      SLP LONG TERM GOAL #2   Title Pt will have demonstrate <2 instances of anomia in a 20 minute conversation with SLP.    Time 8    Period Weeks    Status Deferred      SLP LONG TERM GOAL #3   Title Pt and husband will demonstrate use of communication strategies to reduce frustration and communication breakdowns.    Time 8    Period Weeks    Status Achieved      SLP LONG TERM GOAL #4   Title Pt will expand utterances by 2+ content words during a structured task (RET).    Time 4    Period Weeks    Status Achieved      SLP LONG TERM GOAL #5   Title Pt will create 2 different subjects and objects for a  single verb to increase the specificity of nouns and verbs in sentences during VNeST across 4 sessions.    Time 4    Period Weeks    Status New    Target Date 12/04/20      Additional Long Term Goals   Additional Long Term Goals Yes      SLP LONG TERM GOAL #6   Title Pt will describe a picture using a subject-verb-object sentence independently across 4 sessions.    Time 4    Period Weeks    Status New    Target Date 12/04/20              Plan - 11/23/20 1019     Clinical Impression Statement See tx note. Pt reported she had less instances of word finding with Jeannene Patella and Ron. She had to look for her word a few times in conversation. She reported she found the word eventually using the "delay" strategy. Cont with current POC.    Speech Therapy Frequency 2x / week    Duration 4 weeks    Treatment/Interventions Compensatory strategies;Cueing hierarchy;Functional tasks;Patient/family education;Environmental controls;Multimodal communcation approach;Language facilitation;Compensatory techniques;Internal/external aids;SLP instruction and feedback    Potential to Achieve Goals Good    Consulted and Agree with Plan of Care Patient;Family member/caregiver    Family Member Consulted Husband             Patient will benefit from skilled therapeutic intervention in order to improve the following deficits and impairments:   Aphasia    Problem List Patient Active Problem List   Diagnosis Date Noted   Amnesia 08/02/2020   Acute CVA (cerebrovascular accident) ([place]) 04/07/2019   CVA (cerebral vascular accident) ([place]) 04/05/2019   TIA (transient ischemic attack) 04/05/2019   Endometrial cancer ([place]) 04/22/2017   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 [place], [place], [address] Phone: [phone]   Fax:  [phone]  Name:  Lisa [name] MRN: [id number] Date of Birth: 04-07-1945

## 2020-11-26 ENCOUNTER — Encounter: Payer: Self-pay | Admitting: Speech Pathology

## 2020-11-27 ENCOUNTER — Ambulatory Visit: Payer: Medicare Other | Admitting: Speech Pathology

## 2020-11-27 ENCOUNTER — Other Ambulatory Visit: Payer: Self-pay

## 2020-11-27 DIAGNOSIS — R4701 Aphasia: Secondary | ICD-10-CM | POA: Diagnosis not present

## 2020-11-27 NOTE — Therapy (Signed)
[facility]. [place], [place], [address] Phone: [phone]   Fax:  [phone]  Speech Language Pathology Treatment  Patient Details  Name: Lisa [name] MRN: [id number] Date of Birth: Feb 26, 1945 Referring Provider (SLP): Cari Caraway MD   Encounter Date: 11/27/2020   End of Session - 11/27/20 1055     Visit Number 22    Date for SLP Re-Evaluation 12/05/20    Authorization - Visit Number 7    Authorization - Number of Visits 8    SLP Start Time 1007    SLP Stop Time  1055    SLP Time Calculation (min) 40 min    Activity Tolerance Patient tolerated treatment well             Past Medical History:  Diagnosis Date   Stroke ([facility]) 04/06/2019   Uterine cancer ([facility]) 01/25/2016    Past Surgical History:  Procedure Laterality Date   BREAST EXCISIONAL BIOPSY Left 1975   LOOP RECORDER INSERTION N/A 04/07/2019   Procedure: LOOP RECORDER INSERTION;  Surgeon: Evans Lance, MD;  Location: [facility];  Service: Cardiovascular;  Laterality: N/A;   TUBAL LIGATION  1970    There were no vitals filed for this visit.   Subjective Assessment - 11/27/20 1055     Subjective "I am going to [facility] and [facility] for Thanksgiving."    Currently in Pain? No/denies                   ADULT SLP TREATMENT - 11/27/20 1057       General Information   Behavior/Cognition Cooperative;Alert      Treatment Provided   Treatment provided Cognitive-Linquistic      Cognitive-Linquistic Treatment   Treatment focused on Aphasia    Skilled Treatment SLP facilitated expressive language through object function task. She required minA to complete. She benefited sentence completion and gestural cueing. She requires cueing to take breath/pause prior to answering. She also completed VNeST. She required modA to generate subjects and objects; however, was able to use more grammatically correct sentences.       Assessment / Recommendations / Plan   Plan Continue with current plan of care      Progression Toward Goals   Progression toward goals Progressing toward goals                SLP Short Term Goals - 11/23/20 1032       SLP SHORT TERM GOAL #1   Title Pt will provide +3 categorical descriptions (SFA) of single item independently.    Time 4    Period Weeks    Status Achieved    Target Date 11/04/20      SLP SHORT TERM GOAL #2   Title Pt will increase utterances by +1 content word using RET across 4 sessions.    Time 4    Period Weeks    Status Achieved    Target Date 11/04/20      SLP SHORT TERM GOAL #3   Title Pt will create 2 different subjects and objects for a single verb to increase the specificity of nouns and verbs in sentences during VNeST across 4 sessions.   Error: STG was supposed to be 2 and LTG was supposed to be 3.   Time 2    Period Weeks    Status Achieved      SLP SHORT TERM GOAL #4   Title Pt will describe  a picture using a subject-verb-object sentence given minA across 4 sessions.    Time 2    Period Weeks    Status Achieved    Target Date 11/20/20              SLP Long Term Goals - 11/06/20 0947       SLP LONG TERM GOAL #1   Title Pt and family will report successful use of word finding strategies to decrease anomia in conversation.    Time 8    Period Weeks    Status Achieved      SLP LONG TERM GOAL #2   Title Pt will have demonstrate <2 instances of anomia in a 20 minute conversation with SLP.    Time 8    Period Weeks    Status Deferred      SLP LONG TERM GOAL #3   Title Pt and husband will demonstrate use of communication strategies to reduce frustration and communication breakdowns.    Time 8    Period Weeks    Status Achieved      SLP LONG TERM GOAL #4   Title Pt will expand utterances by 2+ content words during a structured task (RET).    Time 4    Period Weeks    Status Achieved      SLP LONG TERM GOAL #5   Title Pt  will create 2 different subjects and objects for a single verb to increase the specificity of nouns and verbs in sentences during VNeST across 4 sessions.    Time 4    Period Weeks    Status New    Target Date 12/04/20      Additional Long Term Goals   Additional Long Term Goals Yes      SLP LONG TERM GOAL #6   Title Pt will describe a picture using a subject-verb-object sentence independently across 4 sessions.    Time 4    Period Weeks    Status New    Target Date 12/04/20              Plan - 11/27/20 1056     Clinical Impression Statement See tx note. Cont with current POC.    Speech Therapy Frequency 2x / week    Duration 4 weeks    Treatment/Interventions Compensatory strategies;Cueing hierarchy;Functional tasks;Patient/family education;Environmental controls;Multimodal communcation approach;Language facilitation;Compensatory techniques;Internal/external aids;SLP instruction and feedback    Potential to Achieve Goals Good    Consulted and Agree with Plan of Care Patient;Family member/caregiver    Family Member Consulted Husband             Patient will benefit from skilled therapeutic intervention in order to improve the following deficits and impairments:   Aphasia    Problem List Patient Active Problem List   Diagnosis Date Noted   Amnesia 08/02/2020   Acute CVA (cerebrovascular accident) ([facility]) 04/07/2019   CVA (cerebral vascular accident) ([facility]) 04/05/2019   TIA (transient ischemic attack) 04/05/2019   Endometrial cancer ([facility]) 04/22/2017   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 [place], [place], [address] Phone: [phone]   Fax:  [phone]   Name: Lisa [name] MRN: [id number] Date of Birth: [birth year]

## 2020-11-28 ENCOUNTER — Ambulatory Visit: Payer: Medicare Other | Admitting: Gynecologic Oncology

## 2020-11-30 ENCOUNTER — Other Ambulatory Visit: Payer: Self-pay

## 2020-11-30 ENCOUNTER — Ambulatory Visit: Payer: Medicare Other | Admitting: Speech Pathology

## 2020-11-30 DIAGNOSIS — R4701 Aphasia: Secondary | ICD-10-CM | POA: Diagnosis not present

## 2020-11-30 NOTE — Therapy (Signed)
[facility]. [place], [place], [address] Phone: [phone]   Fax:  [phone]  Speech Language Pathology Treatment  Patient Details  Name: Lisa [name] MRN: [id number] Date of Birth: 1946-01-07 Referring Provider (SLP): Cari Caraway MD   Encounter Date: 11/30/2020   End of Session - 11/30/20 1105     Visit Number 23    Date for SLP Re-Evaluation 12/05/20    Authorization - Visit Number 8    Authorization - Number of Visits 8    SLP Start Time 2957    SLP Stop Time  1058    SLP Time Calculation (min) 46 min    Activity Tolerance Patient tolerated treatment well             Past Medical History:  Diagnosis Date   Stroke ([facility]) 04/06/2019   Uterine cancer ([facility]) 01/25/2016    Past Surgical History:  Procedure Laterality Date   BREAST EXCISIONAL BIOPSY Left 1975   LOOP RECORDER INSERTION N/A 04/07/2019   Procedure: LOOP RECORDER INSERTION;  Surgeon: Evans Lance, MD;  Location: [facility];  Service: Cardiovascular;  Laterality: N/A;   TUBAL LIGATION  1970    There were no vitals filed for this visit.   Subjective Assessment - 11/30/20 1104     Subjective "I had a conversation with my girlfriends and I didn't just say, "oh that's good!""    Patient is accompained by: Family member    Currently in Pain? No/denies                   ADULT SLP TREATMENT - 11/30/20 1101       General Information   Behavior/Cognition Cooperative;Alert      Treatment Provided   Treatment provided Cognitive-Linquistic      Cognitive-Linquistic Treatment   Treatment focused on Aphasia    Skilled Treatment SLP facilitated expressive language through VNeST, sentence generation task, and divergent naming task. She required minA to generate SVO and sentences. She required mod-to-maxA to name >4 objects without assistance for foods she makes at Thanksgiving. She benefited from verbal cues and  organization cues (ex: think about a vegetable...think about a meat....) This was succesful at increasing word retrieval.      Assessment / Recommendations / Plan   Plan Continue with current plan of care      Progression Toward Goals   Progression toward goals Progressing toward goals                SLP Short Term Goals - 11/23/20 1032       SLP SHORT TERM GOAL #1   Title Pt will provide +3 categorical descriptions (SFA) of single item independently.    Time 4    Period Weeks    Status Achieved    Target Date 11/04/20      SLP SHORT TERM GOAL #2   Title Pt will increase utterances by +1 content word using RET across 4 sessions.    Time 4    Period Weeks    Status Achieved    Target Date 11/04/20      SLP SHORT TERM GOAL #3   Title Pt will create 2 different subjects and objects for a single verb to increase the specificity of nouns and verbs in sentences during VNeST across 4 sessions.   Error: STG was supposed to be 2 and LTG was supposed to be 3.   Time 2  Period Weeks    Status Achieved      SLP SHORT TERM GOAL #4   Title Pt will describe a picture using a subject-verb-object sentence given minA across 4 sessions.    Time 2    Period Weeks    Status Achieved    Target Date 11/20/20              SLP Long Term Goals - 11/30/20 1106       SLP LONG TERM GOAL #1   Title Pt and family will report successful use of word finding strategies to decrease anomia in conversation.    Time 8    Period Weeks    Status Achieved      SLP LONG TERM GOAL #2   Title Pt will have demonstrate <2 instances of anomia in a 20 minute conversation with SLP.    Time 8    Period Weeks    Status Deferred      SLP LONG TERM GOAL #3   Title Pt and husband will demonstrate use of communication strategies to reduce frustration and communication breakdowns.    Time 8    Period Weeks    Status Achieved      SLP LONG TERM GOAL #4   Title Pt will expand utterances by 2+  content words during a structured task (RET).    Time 4    Period Weeks    Status Achieved      SLP LONG TERM GOAL #5   Title Pt will create 2 different subjects and objects for a single verb to increase the specificity of nouns and verbs in sentences during VNeST across 4 sessions.    Time 4    Period Weeks    Status Achieved      SLP LONG TERM GOAL #6   Title Pt will describe a picture using a subject-verb-object sentence independently across 4 sessions.    Time 4    Period Weeks    Status Not Met              Plan - 11/30/20 1105     Clinical Impression Statement See tx note. Cont with current POC.    Speech Therapy Frequency 2x / week    Duration 4 weeks    Treatment/Interventions Compensatory strategies;Cueing hierarchy;Functional tasks;Patient/family education;Environmental controls;Multimodal communcation approach;Language facilitation;Compensatory techniques;Internal/external aids;SLP instruction and feedback    Potential to Achieve Goals Good    Consulted and Agree with Plan of Care Patient;Family member/caregiver    Family Member Consulted Husband             Patient will benefit from skilled therapeutic intervention in order to improve the following deficits and impairments:   Aphasia    Problem List Patient Active Problem List   Diagnosis Date Noted   Amnesia 08/02/2020   Acute CVA (cerebrovascular accident) ([facility]) 04/07/2019   CVA (cerebral vascular accident) ([facility]) 04/05/2019   TIA (transient ischemic attack) 04/05/2019   Endometrial cancer ([facility]) 04/22/2017   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 [place], [place], [address] Phone: [phone]   Fax:  [phone]   Name: Lisa [name] MRN: [id number] Date of Birth: 27-Jan-1945

## 2020-12-03 ENCOUNTER — Ambulatory Visit: Payer: Medicare Other | Admitting: Speech Pathology

## 2020-12-03 ENCOUNTER — Encounter: Payer: Self-pay | Admitting: Speech Pathology

## 2020-12-03 ENCOUNTER — Other Ambulatory Visit: Payer: Self-pay

## 2020-12-03 DIAGNOSIS — R4701 Aphasia: Secondary | ICD-10-CM

## 2020-12-03 NOTE — Therapy (Signed)
[facility]. [place], [place], [address] Phone: [phone]   Fax:  [phone]  Speech Language Pathology Treatment & Recertification  Patient Details  Name: Lisa [name] MRN: [id number] Date of Birth: 1945/03/27 Referring Provider (SLP): Cari Caraway MD   Encounter Date: 12/03/2020   End of Session - 12/03/20 1108     Visit Number 24    Date for SLP Re-Evaluation 01/02/21    Authorization - Visit Number 1    Authorization - Number of Visits 8    SLP Start Time 1100    SLP Stop Time  1140    SLP Time Calculation (min) 40 min    Activity Tolerance Patient tolerated treatment well             Past Medical History:  Diagnosis Date   Stroke ([facility]) 04/06/2019   Uterine cancer ([facility]) 01/25/2016    Past Surgical History:  Procedure Laterality Date   BREAST EXCISIONAL BIOPSY Left 1975   LOOP RECORDER INSERTION N/A 04/07/2019   Procedure: LOOP RECORDER INSERTION;  Surgeon: Evans Lance, MD;  Location: [facility];  Service: Cardiovascular;  Laterality: N/A;   TUBAL LIGATION  1970    There were no vitals filed for this visit.   Subjective Assessment - 12/03/20 1400     Subjective "I feel like I can't talk on the phone at all!"    Patient is accompained by: Family member    Currently in Pain? No/denies                   ADULT SLP TREATMENT - 12/03/20 1320       General Information   Behavior/Cognition Pleasant mood;Cooperative      Treatment Provided   Treatment provided Cognitive-Linquistic      Cognitive-Linquistic Treatment   Treatment focused on Aphasia    Skilled Treatment Pt participated in an expressive language task where she was instructed to describe objects and their locations within a given space. She required modA and benefited from verbal and sentence completion cues. Pt had most difficulty with description and experienced halted speech.  Pt was also  instructed to name objects within given categories as part of a structured divergent naming task. SLP noted pt benefited from sorting objects within categories by color. Pt required mod-maxA multimodal cues (including gestural, verbal, and written cues) in order to complete task. Pt exhibited increased difficulty with today's tasks.      Assessment / Recommendations / Plan   Plan Continue with current plan of care;Goals updated      Progression Toward Goals   Progression toward goals Progressing toward goals                SLP Short Term Goals - 12/03/20 1329       SLP SHORT TERM GOAL #1   Title During a structured divergent naming task, pt will provide 10 objects in personally-relevant categories with modA.    Time 2    Period Weeks    Status New    Target Date 12/17/20      SLP SHORT TERM GOAL #2   Title --    Time --    Period --    Status --    Target Date --      SLP SHORT TERM GOAL #3   Title Pt will describe objects by +4 features during an SFA task with minA.   Error: STG was supposed to  be 2 and LTG was supposed to be 3.   Time 2    Period Weeks    Status New    Target Date 12/17/20      SLP SHORT TERM GOAL #4   Title Pt will describe a picture using subject-verb-object sentence structure given <1 verbal cue.    Time 2    Period Weeks    Status New    Target Date 12/17/20              SLP Long Term Goals - 12/03/20 1331       SLP LONG TERM GOAL #1   Title Pt will verbally provide 10 items in divergent thinking task to promote thought organization and word retrieval with minA.    Time 4    Period Weeks    Status New    Target Date 12/31/20      SLP LONG TERM GOAL #2   Title Pt will use word finding strategies (describe and delay)  in 20 min conversation given <3 verbal reminders during instances of anomia with SLP.    Time 4    Period Weeks    Status New    Target Date 12/31/20      SLP LONG TERM GOAL #3   Title Pt will describe a picture  using a subject-verb-object sentence independently across 4 sessions.    Time 4    Period Weeks    Status New    Target Date 12/31/20              Plan - 12/03/20 1109     Clinical Impression Statement See tx note. SLP to recert patient this session for additional 8 sessions (x2/week for 4 weeks). Pt continues to make improvement towards expressive language goals, but reports being flustered with word finding deficits on the phone. SLP cont to address anomia and negative feelings regarding communication.    Speech Therapy Frequency 2x / week    Duration 4 weeks    Treatment/Interventions Compensatory strategies;Cueing hierarchy;Functional tasks;Patient/family education;Environmental controls;Multimodal communcation approach;Language facilitation;Compensatory techniques;Internal/external aids;SLP instruction and feedback    Potential to Achieve Goals Good    Consulted and Agree with Plan of Care Patient;Family member/caregiver    Family Member Consulted Husband             Patient will benefit from skilled therapeutic intervention in order to improve the following deficits and impairments:   Aphasia    Problem List Patient Active Problem List   Diagnosis Date Noted   Amnesia 08/02/2020   Acute CVA (cerebrovascular accident) ([facility]) 04/07/2019   CVA (cerebral vascular accident) ([facility]) 04/05/2019   TIA (transient ischemic attack) 04/05/2019   Endometrial cancer ([facility]) 04/22/2017   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 [place], [place], [address] Phone: [phone]   Fax:  [phone]   Name: Lisa [name] MRN: [id number] Date of Birth: 1945/08/25

## 2020-12-05 LAB — CUP PACEART REMOTE DEVICE CHECK
Date Time Interrogation Session: 20221114230745
Implantable Pulse Generator Implant Date: 20210318

## 2020-12-07 ENCOUNTER — Other Ambulatory Visit: Payer: Self-pay

## 2020-12-07 ENCOUNTER — Encounter: Payer: Self-pay | Admitting: Speech Pathology

## 2020-12-07 ENCOUNTER — Ambulatory Visit: Payer: Medicare Other | Admitting: Speech Pathology

## 2020-12-07 DIAGNOSIS — R4701 Aphasia: Secondary | ICD-10-CM

## 2020-12-07 NOTE — Therapy (Signed)
[facility]. [place], [place], [address] Phone: [phone]   Fax:  [phone]  Speech Language Pathology Treatment  Patient Details  Name: Lisa [name] MRN: [id number] Date of Birth: [birth year] Referring Provider (SLP): Cari Caraway MD   Encounter Date: 12/07/2020   End of Session - 12/07/20 1009     Visit Number 25    Date for SLP Re-Evaluation 01/02/21    Authorization - Visit Number 2    Authorization - Number of Visits 8    SLP Start Time 0930    SLP Stop Time  3614    SLP Time Calculation (min) 45 min    Activity Tolerance Patient tolerated treatment well             Past Medical History:  Diagnosis Date   Stroke ([facility]) 04/06/2019   Uterine cancer ([facility]) 01/25/2016    Past Surgical History:  Procedure Laterality Date   BREAST EXCISIONAL BIOPSY Left 1975   LOOP RECORDER INSERTION N/A 04/07/2019   Procedure: LOOP RECORDER INSERTION;  Surgeon: Evans Lance, MD;  Location: [facility];  Service: Cardiovascular;  Laterality: N/A;   TUBAL LIGATION  1970    There were no vitals filed for this visit.   Subjective Assessment - 12/07/20 0933     Subjective "I feel like I am doing okay."    Currently in Pain? No/denies                   ADULT SLP TREATMENT - 12/07/20 1132       General Information   Behavior/Cognition Pleasant mood;Cooperative      Treatment Provided   Treatment provided Cognitive-Linquistic      Cognitive-Linquistic Treatment   Treatment focused on Aphasia    Skilled Treatment Discussed scripting and/or prepping "conversation questions and comments" prior to making a phone call to family members. Pt reported she is able to use delay at home for word retrieval with her husband. She reported typically it takes "a second" and she is able to get it out, but with others, she reported she gets "stuck" and can not move forward. We discussed this is most  likely due to comfortability. We reviewed taking a breath when getting stuck, instead of scrambling and/or laughing. SLP and pt generated a list for two family members of possible questions/comments she could make during conversation to reduce embarrassment if she "gets lost/stuck" on a thought/idea in conversation. Pt reported she thought this would be helpful. She required modA to generate personally-relevant comments and questions. To try for HEP.      Assessment / Recommendations / Plan   Plan Continue with current plan of care;Goals updated      Progression Toward Goals   Progression toward goals Progressing toward goals                SLP Short Term Goals - 12/07/20 1137       SLP SHORT TERM GOAL #1   Title During a structured divergent naming task, pt will provide 10 objects in personally-relevant categories with modA.    Time 2    Period Weeks    Status New    Target Date 12/17/20      SLP SHORT TERM GOAL #2   Title Pt will make a script for a personally-relevant scenario with minA across 3 sessions.    Time 3    Period Weeks    Status New  Target Date 12/21/20      SLP SHORT TERM GOAL #3   Title Pt will describe objects by +4 features during an SFA task with minA.   Error: STG was supposed to be 2 and LTG was supposed to be 3.   Time 2    Period Weeks    Status New    Target Date 12/17/20      SLP SHORT TERM GOAL #4   Title Pt will describe a picture using subject-verb-object sentence structure given <1 verbal cue.    Time 2    Period Weeks    Status New    Target Date 12/17/20              SLP Long Term Goals - 12/03/20 1331       SLP LONG TERM GOAL #1   Title Pt will verbally provide 10 items in divergent thinking task to promote thought organization and word retrieval with minA.    Time 4    Period Weeks    Status New    Target Date 12/31/20      SLP LONG TERM GOAL #2   Title Pt will use word finding strategies (describe and delay)  in 20  min conversation given <3 verbal reminders during instances of anomia with SLP.    Time 4    Period Weeks    Status New    Target Date 12/31/20      SLP LONG TERM GOAL #3   Title Pt will describe a picture using a subject-verb-object sentence independently across 4 sessions.    Time 4    Period Weeks    Status New    Target Date 12/31/20              Plan - 12/07/20 1010     Clinical Impression Statement See tx note. Pt continues to make improvement towards expressive language goals, but reports being flustered with word finding deficits on the phone. SLP cont to address anomia and negative feelings regarding communication. Cont with current POC.    Speech Therapy Frequency 2x / week    Duration 4 weeks    Treatment/Interventions Compensatory strategies;Cueing hierarchy;Functional tasks;Patient/family education;Environmental controls;Multimodal communcation approach;Language facilitation;Compensatory techniques;Internal/external aids;SLP instruction and feedback    Potential to Achieve Goals Good    Consulted and Agree with Plan of Care Patient;Family member/caregiver    Family Member Consulted Husband             Patient will benefit from skilled therapeutic intervention in order to improve the following deficits and impairments:   Aphasia    Problem List Patient Active Problem List   Diagnosis Date Noted   Amnesia 08/02/2020   Acute CVA (cerebrovascular accident) ([facility]) 04/07/2019   CVA (cerebral vascular accident) ([facility]) 04/05/2019   TIA (transient ischemic attack) 04/05/2019   Endometrial cancer ([facility]) 04/22/2017   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 [place], [place], [address] Phone: [phone]   Fax:  [phone]   Name: Lisa [name] MRN: [id number] Date of Birth: September 18, 1945

## 2020-12-10 ENCOUNTER — Other Ambulatory Visit: Payer: Self-pay

## 2020-12-10 ENCOUNTER — Ambulatory Visit: Payer: Medicare Other | Admitting: Speech Pathology

## 2020-12-10 ENCOUNTER — Encounter: Payer: Self-pay | Admitting: Speech Pathology

## 2020-12-10 ENCOUNTER — Ambulatory Visit (INDEPENDENT_AMBULATORY_CARE_PROVIDER_SITE_OTHER): Payer: Medicare Other

## 2020-12-10 DIAGNOSIS — R4701 Aphasia: Secondary | ICD-10-CM | POA: Diagnosis not present

## 2020-12-10 DIAGNOSIS — I639 Cerebral infarction, unspecified: Secondary | ICD-10-CM | POA: Diagnosis not present

## 2020-12-10 NOTE — Therapy (Signed)
[place] [place], [place], [address] Phone: [phone]   Fax:  [phone]  Speech Language Pathology Treatment  Patient Details  Name: Lisa [name] MRN: [id number] Date of Birth: 1945/02/02 Referring Provider (SLP): Cari Caraway MD   Encounter Date: 12/10/2020   End of Session - 12/10/20 0938     Visit Number 26    Date for SLP Re-Evaluation 01/02/21    Authorization - Visit Number 3    Authorization - Number of Visits 8    SLP Start Time 0930    SLP Stop Time  1010    SLP Time Calculation (min) 40 min    Activity Tolerance Patient tolerated treatment well             Past Medical History:  Diagnosis Date   Stroke ([place]) 04/06/2019   Uterine cancer ([place]) 01/25/2016    Past Surgical History:  Procedure Laterality Date   BREAST EXCISIONAL BIOPSY Left 1975   LOOP RECORDER INSERTION N/A 04/07/2019   Procedure: LOOP RECORDER INSERTION;  Surgeon: Evans Lance, MD;  Location: [facility];  Service: Cardiovascular;  Laterality: N/A;   TUBAL LIGATION  1970    There were no vitals filed for this visit.   Subjective Assessment - 12/10/20 0936     Subjective "I was able to talk to my sister for 32 minutes and I used my "How is Thayer Jew doing?"    Currently in Pain? No/denies                   ADULT SLP TREATMENT - 12/10/20 1159       General Information   Behavior/Cognition Alert;Cooperative;Pleasant mood      Treatment Provided   Treatment provided Cognitive-Linquistic      Pain Assessment   Pain Assessment No/denies pain      Cognitive-Linquistic Treatment   Treatment focused on Aphasia    Skilled Treatment Pt reported success with scripting phone conversations. She was able to hold a conversation for 32 minutes with implementation of the script. Pt was instructed to participate in Semantic Feature Analysis (SFA) where she utilized "describe" [employer] Word  Finding Strategy to speak about features of objects. Pt required min-modA and benefited from multimodal cueing (verbal, gestural, orthographic cues) to complete task. SLP observed a decrease in time with language output for pt to provide descriptions of each object.      Assessment / Recommendations / Plan   Plan Continue with current plan of care      Progression Toward Goals   Progression toward goals Progressing toward goals                SLP Short Term Goals - 12/07/20 1137       SLP SHORT TERM GOAL #1   Title During a structured divergent naming task, pt will provide 10 objects in personally-relevant categories with modA.    Time 2    Period Weeks    Status New    Target Date 12/17/20      SLP SHORT TERM GOAL #2   Title Pt will make a script for a personally-relevant scenario with minA across 3 sessions.    Time 3    Period Weeks    Status New    Target Date 12/21/20      SLP SHORT TERM GOAL #3   Title Pt will describe objects by +4 features during an SFA task with minA.  Error: STG was supposed to be 2 and LTG was supposed to be 3.   Time 2    Period Weeks    Status New    Target Date 12/17/20      SLP SHORT TERM GOAL #4   Title Pt will describe a picture using subject-verb-object sentence structure given <1 verbal cue.    Time 2    Period Weeks    Status New    Target Date 12/17/20              SLP Long Term Goals - 12/03/20 1331       SLP LONG TERM GOAL #1   Title Pt will verbally provide 10 items in divergent thinking task to promote thought organization and word retrieval with minA.    Time 4    Period Weeks    Status New    Target Date 12/31/20      SLP LONG TERM GOAL #2   Title Pt will use word finding strategies (describe and delay)  in 20 min conversation given <3 verbal reminders during instances of anomia with SLP.    Time 4    Period Weeks    Status New    Target Date 12/31/20      SLP LONG TERM GOAL #3   Title Pt will describe  a picture using a subject-verb-object sentence independently across 4 sessions.    Time 4    Period Weeks    Status New    Target Date 12/31/20              Plan - 12/10/20 4010     Clinical Impression Statement See tx note. Pt continues to make improvement towards expressive language goals, but reports being flustered with word finding deficits on the phone. SLP cont to address anomia and negative feelings regarding communication. Cont with current POC.    Speech Therapy Frequency 2x / week    Duration 4 weeks    Treatment/Interventions Compensatory strategies;Cueing hierarchy;Functional tasks;Patient/family education;Environmental controls;Multimodal communcation approach;Language facilitation;Compensatory techniques;Internal/external aids;SLP instruction and feedback    Potential to Achieve Goals Good    Consulted and Agree with Plan of Care Patient;Family member/caregiver    Family Member Consulted Husband             Patient will benefit from skilled therapeutic intervention in order to improve the following deficits and impairments:   Aphasia    Problem List Patient Active Problem List   Diagnosis Date Noted   Amnesia 08/02/2020   Acute CVA (cerebrovascular accident) ([facility]) 04/07/2019   CVA (cerebral vascular accident) ([facility]) 04/05/2019   TIA (transient ischemic attack) 04/05/2019   Endometrial cancer ([place]) 04/22/2017   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 Communication Sciences and Disorders  12/10/2020, 12:04 PM  [place]. [place], [place], [address] Phone: [phone]   Fax:  [phone]   Name: Lisa [name] MRN: [id number] Date of Birth: 1945/05/08

## 2020-12-17 ENCOUNTER — Other Ambulatory Visit: Payer: Self-pay

## 2020-12-17 ENCOUNTER — Ambulatory Visit: Payer: Medicare Other | Admitting: Speech Pathology

## 2020-12-17 ENCOUNTER — Encounter: Payer: Self-pay | Admitting: Speech Pathology

## 2020-12-17 DIAGNOSIS — R4701 Aphasia: Secondary | ICD-10-CM

## 2020-12-17 NOTE — Therapy (Signed)
[place]. [place], [place], [address] Phone: [phone]   Fax:  [phone]  Speech Language Pathology Treatment  Patient Details  Name: Lisa [name] MRN: [id number] Date of Birth: Dec 08, 1945 Referring Provider (SLP): Cari Caraway MD   Encounter Date: 12/17/2020   End of Session - 12/17/20 0935     Visit Number 27    Date for SLP Re-Evaluation 01/02/21    Authorization - Visit Number 4    Authorization - Number of Visits 8    SLP Start Time 0930    SLP Stop Time  1010    SLP Time Calculation (min) 40 min    Activity Tolerance Patient tolerated treatment well             Past Medical History:  Diagnosis Date   Stroke ([facility]) 04/06/2019   Uterine cancer ([facility]) 01/25/2016    Past Surgical History:  Procedure Laterality Date   BREAST EXCISIONAL BIOPSY Left 1975   LOOP RECORDER INSERTION N/A 04/07/2019   Procedure: LOOP RECORDER INSERTION;  Surgeon: Evans Lance, MD;  Location: [facility];  Service: Cardiovascular;  Laterality: N/A;   TUBAL LIGATION  1970    There were no vitals filed for this visit.   Subjective Assessment - 12/17/20 0932     Subjective "I had a little food, but not too much."    Currently in Pain? No/denies                   ADULT SLP TREATMENT - 12/17/20 0941       General Information   Behavior/Cognition Alert;Cooperative;Pleasant mood      Treatment Provided   Treatment provided Cognitive-Linquistic      Cognitive-Linquistic Treatment   Treatment focused on Aphasia    Skilled Treatment Continued practice of using word-finding strategies during structured conversation/therapy task. Pt was instructed to list items needed for task. We began with patient describing a meal. Pt required prompting to choose a meal (what did you have last night?); however, was able to name 7 ingredients needed with minA. She then was asked to sequence 4 steps. She  was able to complete minA overall. Cont to exhibit difficulty with word finding in conversation. Pt benefits from prompting to take a breath/pause prior to answering when she begins to get flustered which impacts her word finding significantly.      Assessment / Recommendations / Plan   Plan Continue with current plan of care      Progression Toward Goals   Progression toward goals Progressing toward goals                SLP Short Term Goals - 12/07/20 1137       SLP SHORT TERM GOAL #1   Title During a structured divergent naming task, pt will provide 10 objects in personally-relevant categories with modA.    Time 2    Period Weeks    Status New    Target Date 12/17/20      SLP SHORT TERM GOAL #2   Title Pt will make a script for a personally-relevant scenario with minA across 3 sessions.    Time 3    Period Weeks    Status New    Target Date 12/21/20      SLP SHORT TERM GOAL #3   Title Pt will describe objects by +4 features during an SFA task with minA.   Error: STG was supposed to  be 2 and LTG was supposed to be 3.   Time 2    Period Weeks    Status New    Target Date 12/17/20      SLP SHORT TERM GOAL #4   Title Pt will describe a picture using subject-verb-object sentence structure given <1 verbal cue.    Time 2    Period Weeks    Status New    Target Date 12/17/20              SLP Long Term Goals - 12/03/20 1331       SLP LONG TERM GOAL #1   Title Pt will verbally provide 10 items in divergent thinking task to promote thought organization and word retrieval with minA.    Time 4    Period Weeks    Status New    Target Date 12/31/20      SLP LONG TERM GOAL #2   Title Pt will use word finding strategies (describe and delay)  in 20 min conversation given <3 verbal reminders during instances of anomia with SLP.    Time 4    Period Weeks    Status New    Target Date 12/31/20      SLP LONG TERM GOAL #3   Title Pt will describe a picture using a  subject-verb-object sentence independently across 4 sessions.    Time 4    Period Weeks    Status New    Target Date 12/31/20              Plan - 12/17/20 0935     Clinical Impression Statement See tx note. Pt reports she continues to be reserved in conversation and did not attempt to have side conversations at Thanksgiving. SLP cont to address anomia and negative feelings regarding communication. Cont with current POC.    Speech Therapy Frequency 2x / week    Duration 4 weeks    Treatment/Interventions Compensatory strategies;Cueing hierarchy;Functional tasks;Patient/family education;Environmental controls;Multimodal communcation approach;Language facilitation;Compensatory techniques;Internal/external aids;SLP instruction and feedback    Potential to Achieve Goals Good    Consulted and Agree with Plan of Care Patient;Family member/caregiver    Family Member Consulted Husband             Patient will benefit from skilled therapeutic intervention in order to improve the following deficits and impairments:   Aphasia    Problem List Patient Active Problem List   Diagnosis Date Noted   Amnesia 08/02/2020   Acute CVA (cerebrovascular accident) ([place]) 04/07/2019   CVA (cerebral vascular accident) ([facility]) 04/05/2019   TIA (transient ischemic attack) 04/05/2019   Endometrial cancer ([place] [facility]) 04/22/2017   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 [place], [place], [address] Phone: [phone]   Fax:  [phone]   Name: Kateri Balch MRN: [id number] Date of Birth: [birth year]

## 2020-12-19 NOTE — Progress Notes (Signed)
Carelink Summary Report / Loop Recorder 

## 2020-12-20 ENCOUNTER — Ambulatory Visit: Payer: Medicare Other | Attending: Family Medicine | Admitting: Speech Pathology

## 2020-12-20 ENCOUNTER — Other Ambulatory Visit: Payer: Self-pay

## 2020-12-20 ENCOUNTER — Encounter: Payer: Self-pay | Admitting: Speech Pathology

## 2020-12-20 DIAGNOSIS — R4701 Aphasia: Secondary | ICD-10-CM | POA: Insufficient documentation

## 2020-12-20 NOTE — Therapy (Signed)
[place]. [place], [place], [address] Phone: [phone]   Fax:  [phone]  Speech Language Pathology Treatment  Patient Details  Name: Lisa [name] MRN: [id number] Date of Birth: 1945-05-28 Referring Provider (SLP): Cari Caraway MD   Encounter Date: 12/20/2020   End of Session - 12/20/20 1617     Visit Number 28    Date for SLP Re-Evaluation 01/02/21    Authorization - Visit Number 5    Authorization - Number of Visits 8    SLP Start Time 1100    SLP Stop Time  1140    SLP Time Calculation (min) 40 min    Activity Tolerance Patient tolerated treatment well             Past Medical History:  Diagnosis Date   Stroke ([facility]) 04/06/2019   Uterine cancer ([facility]) 01/25/2016    Past Surgical History:  Procedure Laterality Date   BREAST EXCISIONAL BIOPSY Left 1975   LOOP RECORDER INSERTION N/A 04/07/2019   Procedure: LOOP RECORDER INSERTION;  Surgeon: Evans Lance, MD;  Location: [facility];  Service: Cardiovascular;  Laterality: N/A;   TUBAL LIGATION  1970    There were no vitals filed for this visit.   Subjective Assessment - 12/20/20 1109     Subjective "I'm doing good."    Currently in Pain? No/denies                   ADULT SLP TREATMENT - 12/20/20 1621       General Information   Behavior/Cognition Alert;Cooperative;Pleasant mood      Treatment Provided   Treatment provided Cognitive-Linquistic      Cognitive-Linquistic Treatment   Treatment focused on Aphasia    Skilled Treatment Facilitated increased word retrieval by completing verbal expression task. Pt was asked to generate items found in a kitchen in 1 minute. Pt was only able to name 3 independently. With prompting modA verbal cues, pt was able to generate 10 in ~5 minutes. Pt was asked to choose 2 meals and describe them to SLP. We started with generating a list of ingredients. SLP provided  descriptions of objects to aid word finding. Pt cont to require mod to maxA and demonstrates low frustration tolerance (wanting to look to husband for assistance vs. using other word finding strategies. She benefits from verbal encouragement to trial strategies before "coming back later"/.      Assessment / Recommendations / Plan   Plan Continue with current plan of care      Progression Toward Goals   Progression toward goals Progressing toward goals                SLP Short Term Goals - 12/20/20 1618       SLP SHORT TERM GOAL #1   Title During a structured divergent naming task, pt will provide 10 objects in personally-relevant categories with modA.    Time 2    Period Weeks    Status On-going   Not met; cont   Target Date 01/03/21      SLP SHORT TERM GOAL #2   Title Pt will make a script for a personally-relevant scenario with minA across 3 sessions.    Time 3    Period Weeks    Status On-going      SLP SHORT TERM GOAL #3   Title Pt will describe objects by +4 features during an SFA task with minA.  Error: STG was supposed to be 2 and LTG was supposed to be 3.   Time 2    Period Weeks    Status Achieved      SLP SHORT TERM GOAL #4   Title Pt will describe a picture using subject-verb-object sentence structure given <1 verbal cue.    Time 2    Period Weeks    Status On-going    Target Date 01/03/21              SLP Long Term Goals - 12/03/20 1331       SLP LONG TERM GOAL #1   Title Pt will verbally provide 10 items in divergent thinking task to promote thought organization and word retrieval with minA.    Time 4    Period Weeks    Status New    Target Date 12/31/20      SLP LONG TERM GOAL #2   Title Pt will use word finding strategies (describe and delay)  in 20 min conversation given <3 verbal reminders during instances of anomia with SLP.    Time 4    Period Weeks    Status New    Target Date 12/31/20      SLP LONG TERM GOAL #3   Title Pt will  describe a picture using a subject-verb-object sentence independently across 4 sessions.    Time 4    Period Weeks    Status New    Target Date 12/31/20              Plan - 12/20/20 1618     Clinical Impression Statement See tx note. Pt reports she went to dinner, a friend made her feel comfortable, and she was able to communicate better because of this. SLP cont to address anomia and negative feelings regarding communication. Cont with current POC.    Speech Therapy Frequency 2x / week    Duration 4 weeks    Treatment/Interventions Compensatory strategies;Cueing hierarchy;Functional tasks;Patient/family education;Environmental controls;Multimodal communcation approach;Language facilitation;Compensatory techniques;Internal/external aids;SLP instruction and feedback    Potential to Achieve Goals Good    Consulted and Agree with Plan of Care Patient;Family member/caregiver    Family Member Consulted Husband             Patient will benefit from skilled therapeutic intervention in order to improve the following deficits and impairments:   Aphasia    Problem List Patient Active Problem List   Diagnosis Date Noted   Amnesia 08/02/2020   Acute CVA (cerebrovascular accident) ([facility]) 04/07/2019   CVA (cerebral vascular accident) ([facility]) 04/05/2019   TIA (transient ischemic attack) 04/05/2019   Endometrial cancer ([facility]) 04/22/2017   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 [place], [place], [address] Phone: [phone]   Fax:  [phone]   Name: Lisa [name] MRN: [id number] Date of Birth: 09/19/1945  

## 2020-12-24 ENCOUNTER — Other Ambulatory Visit: Payer: Self-pay

## 2020-12-24 ENCOUNTER — Encounter: Payer: Self-pay | Admitting: Speech Pathology

## 2020-12-24 ENCOUNTER — Ambulatory Visit: Payer: Medicare Other | Admitting: Speech Pathology

## 2020-12-24 DIAGNOSIS — R4701 Aphasia: Secondary | ICD-10-CM | POA: Diagnosis not present

## 2020-12-24 NOTE — Therapy (Signed)
[facility]. [place], [place], [address] Phone: [phone]   Fax:  [phone]  Speech Language Pathology Treatment  Patient Details  Name: Lisa [name] MRN: [id number] Date of Birth: 07-26-1945 Referring Provider (SLP): Cari Caraway MD   Encounter Date: 12/24/2020   End of Session - 12/24/20 0937     Visit Number 29    Date for SLP Re-Evaluation 01/02/21    Authorization - Visit Number 6    Authorization - Number of Visits 8    SLP Start Time 8182    SLP Stop Time  9937    SLP Time Calculation (min) 41 min    Activity Tolerance Patient tolerated treatment well             Past Medical History:  Diagnosis Date   Stroke ([place]) 04/06/2019   Uterine cancer ([place]) 01/25/2016    Past Surgical History:  Procedure Laterality Date   BREAST EXCISIONAL BIOPSY Left 1975   LOOP RECORDER INSERTION N/A 04/07/2019   Procedure: LOOP RECORDER INSERTION;  Surgeon: Evans Lance, MD;  [facility];  Service: Cardiovascular;  Laterality: N/A;   TUBAL LIGATION  1970    There were no vitals filed for this visit.   Subjective Assessment - 12/24/20 0937     Subjective "I had a good weekend."    Currently in Pain? No/denies                   ADULT SLP TREATMENT - 12/24/20 0954       General Information   Behavior/Cognition Alert;Cooperative;Pleasant mood      Treatment Provided   Treatment provided Cognitive-Linquistic      Cognitive-Linquistic Treatment   Treatment focused on Aphasia    Skilled Treatment Facilitated increased word retrieval by completing verbal expression task. Pt was asked to generate items needed for going on a beach trip. Pt was only able to name 3 independently within 1 min time frame. With prompting modA verbal cues, pt was able to generate more items when provided with subcategories. When constrained to using "describe" strategy, pt is able to retrieve  words with more success.      Assessment / Recommendations / Plan   Plan Continue with current plan of care      Progression Toward Goals   Progression toward goals Progressing toward goals                SLP Short Term Goals - 12/20/20 1618       SLP SHORT TERM GOAL #1   Title During a structured divergent naming task, pt will provide 10 objects in personally-relevant categories with modA.    Time 2    Period Weeks    Status On-going   Not met; cont   Target Date 01/03/21      SLP SHORT TERM GOAL #2   Title Pt will make a script for a personally-relevant scenario with minA across 3 sessions.    Time 3    Period Weeks    Status On-going      SLP SHORT TERM GOAL #3   Title Pt will describe objects by +4 features during an SFA task with minA.   Error: STG was supposed to be 2 and LTG was supposed to be 3.   Time 2    Period Weeks    Status Achieved      SLP SHORT TERM GOAL #4   Title  Pt will describe a picture using subject-verb-object sentence structure given <1 verbal cue.    Time 2    Period Weeks    Status On-going    Target Date 01/03/21              SLP Long Term Goals - 12/03/20 1331       SLP LONG TERM GOAL #1   Title Pt will verbally provide 10 items in divergent thinking task to promote thought organization and word retrieval with minA.    Time 4    Period Weeks    Status New    Target Date 12/31/20      SLP LONG TERM GOAL #2   Title Pt will use word finding strategies (describe and delay)  in 20 min conversation given <3 verbal reminders during instances of anomia with SLP.    Time 4    Period Weeks    Status New    Target Date 12/31/20      SLP LONG TERM GOAL #3   Title Pt will describe a picture using a subject-verb-object sentence independently across 4 sessions.    Time 4    Period Weeks    Status New    Target Date 12/31/20              Plan - 12/24/20 0941     Clinical Impression Statement See tx note.SLP cont to  address anomia and negative feelings regarding communication. Cont with current POC.    Speech Therapy Frequency 2x / week    Duration 4 weeks    Treatment/Interventions Compensatory strategies;Cueing hierarchy;Functional tasks;Patient/family education;Environmental controls;Multimodal communcation approach;Language facilitation;Compensatory techniques;Internal/external aids;SLP instruction and feedback    Potential to Achieve Goals Good    Consulted and Agree with Plan of Care Patient;Family member/caregiver    Family Member Consulted Husband             Patient will benefit from skilled therapeutic intervention in order to improve the following deficits and impairments:   Aphasia    Problem List Patient Active Problem List   Diagnosis Date Noted   Amnesia 08/02/2020   Acute CVA (cerebrovascular accident) ([facility]) 04/07/2019   CVA (cerebral vascular accident) ([place]) 04/05/2019   TIA (transient ischemic attack) 04/05/2019   Endometrial cancer ([facility]) 04/22/2017   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 [place], [place], [address] Phone: [phone]   Fax:  [phone]   Name: Lisa [name] MRN: [id number] Date of Birth: 10/12/1945

## 2020-12-28 ENCOUNTER — Other Ambulatory Visit: Payer: Self-pay

## 2020-12-28 ENCOUNTER — Ambulatory Visit: Payer: Medicare Other | Admitting: Speech Pathology

## 2020-12-28 ENCOUNTER — Encounter: Payer: Self-pay | Admitting: Speech Pathology

## 2020-12-28 DIAGNOSIS — R4701 Aphasia: Secondary | ICD-10-CM

## 2020-12-28 NOTE — Therapy (Signed)
[facility]. [place], [place], [address] Phone: [phone]   Fax:  [phone]  Speech Language Pathology Treatment & Progress Note  Patient Details  Name: Lisa [name] MRN: [id number] Date of Birth: [birth year] Referring Provider (SLP): Cari Caraway MD   Encounter Date: 12/28/2020   End of Session - 12/28/20 0933     Visit Number 30    Date for SLP Re-Evaluation 01/02/21    Authorization - Visit Number 7    Authorization - Number of Visits 8    SLP Start Time 0930    SLP Stop Time  1010    SLP Time Calculation (min) 40 min    Activity Tolerance Patient tolerated treatment well             Past Medical History:  Diagnosis Date   Stroke ([facility]) 04/06/2019   Uterine cancer ([facility]) 01/25/2016    Past Surgical History:  Procedure Laterality Date   BREAST EXCISIONAL BIOPSY Left 1975   LOOP RECORDER INSERTION N/A 04/07/2019   Procedure: LOOP RECORDER INSERTION;  Surgeon: Evans Lance, MD;  Location: [facility];  Service: Cardiovascular;  Laterality: N/A;   TUBAL LIGATION  1970    There were no vitals filed for this visit.   Subjective Assessment - 12/28/20 0932     Subjective "I forgot my homework."    Currently in Pain? No/denies                   ADULT SLP TREATMENT - 12/28/20 1030       General Information   Behavior/Cognition Alert;Cooperative;Pleasant mood      Treatment Provided   Treatment provided Cognitive-Linquistic      Cognitive-Linquistic Treatment   Treatment focused on Aphasia    Skilled Treatment SLP facilitated increased categorization for word retrieval by asking pt to generate items in concrete categories. Pt was able to generate 5 items in a one minute time frame. With prompting questions and extra time, pt was able to name 10+ categorical members. Pt recieved phone call from great grandson and granddaughter. SLP facilitated conversational exchanges.  Pt asked questions with prompting from therapist vs. silence and basic phrases.      Assessment / Recommendations / Plan   Plan Continue with current plan of care      Progression Toward Goals   Progression toward goals Progressing toward goals                SLP Short Term Goals - 12/20/20 1618       SLP SHORT TERM GOAL #1   Title During a structured divergent naming task, pt will provide 10 objects in personally-relevant categories with modA.    Time 2    Period Weeks    Status On-going   Not met; cont   Target Date 01/03/21      SLP SHORT TERM GOAL #2   Title Pt will make a script for a personally-relevant scenario with minA across 3 sessions.    Time 3    Period Weeks    Status On-going      SLP SHORT TERM GOAL #3   Title Pt will describe objects by +4 features during an SFA task with minA.   Error: STG was supposed to be 2 and LTG was supposed to be 3.   Time 2    Period Weeks    Status Achieved      SLP SHORT TERM GOAL #  4   Title Pt will describe a picture using subject-verb-object sentence structure given <1 verbal cue.    Time 2    Period Weeks    Status On-going    Target Date 01/03/21              SLP Long Term Goals - 12/03/20 1331       SLP LONG TERM GOAL #1   Title Pt will verbally provide 10 items in divergent thinking task to promote thought organization and word retrieval with minA.    Time 4    Period Weeks    Status New    Target Date 12/31/20      SLP LONG TERM GOAL #2   Title Pt will use word finding strategies (describe and delay)  in 20 min conversation given <3 verbal reminders during instances of anomia with SLP.    Time 4    Period Weeks    Status New    Target Date 12/31/20      SLP LONG TERM GOAL #3   Title Pt will describe a picture using a subject-verb-object sentence independently across 4 sessions.    Time 4    Period Weeks    Status New    Target Date 12/31/20              Plan - 12/28/20 0934      Clinical Impression Statement See tx note.SLP cont to address anomia and negative feelings regarding communication. Cont with current POC.    Speech Therapy Frequency 2x / week    Duration 4 weeks    Treatment/Interventions Compensatory strategies;Cueing hierarchy;Functional tasks;Patient/family education;Environmental controls;Multimodal communcation approach;Language facilitation;Compensatory techniques;Internal/external aids;SLP instruction and feedback    Potential to Achieve Goals Good    Consulted and Agree with Plan of Care Patient;Family member/caregiver    Family Member Consulted Husband             Patient will benefit from skilled therapeutic intervention in order to improve the following deficits and impairments:   Aphasia    Problem List Patient Active Problem List   Diagnosis Date Noted   Amnesia 08/02/2020   Acute CVA (cerebrovascular accident) ([facility]) 04/07/2019   CVA (cerebral vascular accident) ([facility]) 04/05/2019   TIA (transient ischemic attack) 04/05/2019   Endometrial cancer ([facility]) 04/22/2017   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   Objective: See previous tx notes. She cont to need encouragement to take deep breaths when getting frustrated, as this enhances fluency significantly.   Goal Update: Pt is making progress towards goals.   Plan: Cont with current POC  Reason Skilled Services are Required: SLP cont to rec skilled speech services to address expressive language impairment to maximize functional communication and increase participation in daily life.    [name], [place] 12/28/2020, 10:36 AM  [facility]. [place], [place], [address] Phone: [phone]   Fax:  [phone]   Name: Lisa [name] MRN: [id number] Date of Birth: 1945/12/27

## 2020-12-31 ENCOUNTER — Ambulatory Visit: Payer: Medicare Other | Admitting: Speech Pathology

## 2021-01-04 ENCOUNTER — Ambulatory Visit: Payer: Medicare Other | Admitting: Speech Pathology

## 2021-01-07 ENCOUNTER — Ambulatory Visit: Payer: Medicare Other | Admitting: Speech Pathology

## 2021-01-07 ENCOUNTER — Other Ambulatory Visit: Payer: Self-pay

## 2021-01-07 DIAGNOSIS — R4701 Aphasia: Secondary | ICD-10-CM | POA: Diagnosis not present

## 2021-01-07 NOTE — Therapy (Signed)
[name]. [place], [place], [address] Phone: [phone]   Fax:  [phone]  Speech Language Pathology Treatment & Recertification  Patient Details  Name: Lisa [name] MRN: [id number] Date of Birth: [birth year] Referring Provider (SLP): Lisa Caraway MD   Encounter Date: 01/07/2021   End of Session - 01/07/21 1027     Visit Number 31    Date for SLP Re-Evaluation 02/03/20    Authorization - Visit Number 8    Authorization - Number of Visits 8    SLP Start Time 1219    SLP Stop Time  1100    SLP Time Calculation (min) 45 min    Activity Tolerance Patient tolerated treatment well             Past Medical History:  Diagnosis Date   Stroke ([facility]) 04/06/2019   Uterine cancer ([facility]) 01/25/2016    Past Surgical History:  Procedure Laterality Date   BREAST EXCISIONAL BIOPSY Left 1975   LOOP RECORDER INSERTION N/A 04/07/2019   Procedure: LOOP RECORDER INSERTION;  Surgeon: Lisa Lance, MD;  Location: [facility];  Service: Cardiovascular;  Laterality: N/A;   TUBAL LIGATION  1970    There were no vitals filed for this visit.   Subjective Assessment - 01/07/21 1027     Subjective "We are going to [place] this week!"    Currently in Pain? No/denies                   ADULT SLP TREATMENT - 01/07/21 1046       General Information   Behavior/Cognition Alert;Cooperative;Pleasant mood      Treatment Provided   Treatment provided Cognitive-Linquistic      Cognitive-Linquistic Treatment   Treatment focused on Aphasia    Skilled Treatment SLP facilitated increased categorization for word retrieval by asking pt to complete convergent and divergent naming tasks. Pt had difficulty with both tasks; however, most difficulty was noted with divergent tasks. She required min-to-modA overall. Pt reported, "I can't write!" and reports she does not practice writing at home. SLP to begin  incorporating more writing into therapy. Provided pt with categorization HEP.      Assessment / Recommendations / Plan   Plan Continue with current plan of care      Progression Toward Goals   Progression toward goals Progressing toward goals                SLP Short Term Goals - 01/07/21 1029       SLP SHORT TERM GOAL #1   Title During a structured divergent naming task, pt will provide 10 objects in personally-relevant categories with modA.    Time 2    Period Weeks    Status Achieved   Not met; cont   Target Date 01/03/21      SLP SHORT TERM GOAL #2   Title Pt will make a script for a personally-relevant scenario with minA across 3 sessions.    Time 3    Period Weeks    Status Achieved      SLP SHORT TERM GOAL #3   Title Pt will describe objects by +4 features during an SFA task with minA.   Error: STG was supposed to be 2 and LTG was supposed to be 3.   Time 2    Period Weeks    Status Achieved      SLP SHORT TERM GOAL #4   Title  Pt will describe a picture using subject-verb-object sentence structure given <1 verbal cue.    Time 2    Period Weeks    Status On-going    Target Date 01/03/21              SLP Long Term Goals - 01/07/21 1031       SLP LONG TERM GOAL #1   Title Pt will verbally provide 10 items in divergent thinking task to promote thought organization and word retrieval with minA.    Time 4    Period Weeks    Status Not Met   To cont     SLP LONG TERM GOAL #2   Title Pt will use word finding strategies (describe and delay)  in 20 min conversation given <3 verbal reminders during instances of anomia with SLP.    Time 4    Period Weeks    Status Not Met      SLP LONG TERM GOAL #3   Title Pt will describe a picture using a subject-verb-object sentence independently across 4 sessions.    Time 4    Period Weeks    Status Not Met              Plan - 01/07/21 1028     Clinical Impression Statement See tx note.SLP cont to address  anomia and negative feelings regarding communication. To recertify patient for additional month to continue address categorical organization to increase word retrieval.    Speech Therapy Frequency 2x / week    Duration 4 weeks    Treatment/Interventions Compensatory strategies;Cueing hierarchy;Functional tasks;Patient/family education;Environmental controls;Multimodal communcation approach;Language facilitation;Compensatory techniques;Internal/external aids;SLP instruction and feedback    Potential to Achieve Goals Good    Consulted and Agree with Plan of Care Patient;Family member/caregiver    Family Member Consulted Husband             Patient will benefit from skilled therapeutic intervention in order to improve the following deficits and impairments:   Aphasia    Problem List Patient Active Problem List   Diagnosis Date Noted   Amnesia 08/02/2020   Acute CVA (cerebrovascular accident) ([facility]) 04/07/2019   CVA (cerebral vascular accident) ([facility]) 04/05/2019   TIA (transient ischemic attack) 04/05/2019   Endometrial cancer ([facility]) 04/22/2017   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 [place], [place], [address] Phone: [phone]   Fax:  [phone]   Name: Lisa [name] MRN: [id number] Date of Birth: [birth year]

## 2021-01-10 LAB — CUP PACEART REMOTE DEVICE CHECK
Date Time Interrogation Session: 20221217231125
Implantable Pulse Generator Implant Date: 20210318

## 2021-01-16 ENCOUNTER — Ambulatory Visit (INDEPENDENT_AMBULATORY_CARE_PROVIDER_SITE_OTHER): Payer: Medicare Other

## 2021-01-16 ENCOUNTER — Ambulatory Visit: Payer: Medicare Other | Admitting: Speech Pathology

## 2021-01-16 DIAGNOSIS — I639 Cerebral infarction, unspecified: Secondary | ICD-10-CM

## 2021-01-18 ENCOUNTER — Ambulatory Visit: Payer: Medicare Other | Admitting: Speech Pathology

## 2021-01-22 ENCOUNTER — Encounter: Payer: Self-pay | Admitting: Speech Pathology

## 2021-01-22 ENCOUNTER — Other Ambulatory Visit: Payer: Self-pay

## 2021-01-22 ENCOUNTER — Ambulatory Visit: Payer: Medicare Other | Attending: Family Medicine | Admitting: Speech Pathology

## 2021-01-22 DIAGNOSIS — R4701 Aphasia: Secondary | ICD-10-CM | POA: Diagnosis not present

## 2021-01-22 NOTE — Therapy (Signed)
[facility]. [place], [place], [address] Phone: [phone]   Fax:  [phone]  Speech Language Pathology Treatment & Recertification  Patient Details  Name: Lisa [name] MRN: [id number] Date of Birth: 09-27-1945 Referring Provider (SLP): Cari Caraway MD   Encounter Date: 01/22/2021   End of Session - 01/22/21 1105     Visit Number 32    Date for SLP Re-Evaluation 02/20/21    Authorization - Visit Number 1    Authorization - Number of Visits 1    SLP Start Time 8032    SLP Stop Time  1058    SLP Time Calculation (min) 43 min    Activity Tolerance Patient tolerated treatment well             Past Medical History:  Diagnosis Date   Stroke ([facility]) 04/06/2019   Uterine cancer ([facility]) 01/25/2016    Past Surgical History:  Procedure Laterality Date   BREAST EXCISIONAL BIOPSY Left 1975   LOOP RECORDER INSERTION N/A 04/07/2019   Procedure: LOOP RECORDER INSERTION;  Surgeon: Evans Lance, MD;  Location: [facility];  Service: Cardiovascular;  Laterality: N/A;   TUBAL LIGATION  1970    There were no vitals filed for this visit.              SLP Short Term Goals - 01/07/21 1029       SLP SHORT TERM GOAL #1   Title During a structured divergent naming task, pt will provide 10 objects in personally-relevant categories with modA.    Time 2    Period Weeks    Status Achieved   Not met; cont   Target Date 01/03/21      SLP SHORT TERM GOAL #2   Title Pt will make a script for a personally-relevant scenario with minA across 3 sessions.    Time 3    Period Weeks    Status Achieved      SLP SHORT TERM GOAL #3   Title Pt will describe objects by +4 features during an SFA task with minA.   Error: STG was supposed to be 2 and LTG was supposed to be 3.   Time 2    Period Weeks    Status Achieved      SLP SHORT TERM GOAL #4   Title Pt will describe a picture using  subject-verb-object sentence structure given <1 verbal cue.    Time 2    Period Weeks    Status On-going    Target Date 01/03/21              SLP Long Term Goals - 01/07/21 1031       SLP LONG TERM GOAL #1   Title Pt will verbally provide 10 items in divergent thinking task to promote thought organization and word retrieval with minA.    Time 4    Period Weeks    Status Not Met   To cont     SLP LONG TERM GOAL #2   Title Pt will use word finding strategies (describe and delay)  in 20 min conversation given <3 verbal reminders during instances of anomia with SLP.    Time 4    Period Weeks    Status Not Met      SLP LONG TERM GOAL #3   Title Pt will describe a picture using a subject-verb-object sentence independently across 4 sessions.    Time 4  Period Weeks    Status Not Met              Plan - 01/22/21 1105     Clinical Impression Statement See tx note. Recert for 1 additional session (today). Pt reports she would like to defer "take a break" from treatment for month and then continue. She has scheduled appointments for February. Will have to complete an additional recert to cover sessions for February. SLP continues to rec skilled speech services due to expressive language deficits that impact her ability to participate functionally with others.    Speech Therapy Frequency 2x / week    Duration 4 weeks    Treatment/Interventions Compensatory strategies;Cueing hierarchy;Functional tasks;Patient/family education;Environmental controls;Multimodal communcation approach;Language facilitation;Compensatory techniques;Internal/external aids;SLP instruction and feedback    Potential to Achieve Goals Good    Consulted and Agree with Plan of Care Patient;Family member/caregiver    Family Member Consulted Husband             Patient will benefit from skilled therapeutic intervention in order to improve the following deficits and impairments:   Aphasia    Problem  List Patient Active Problem List   Diagnosis Date Noted   Amnesia 08/02/2020   Acute CVA (cerebrovascular accident) ([facility]) 04/07/2019   CVA (cerebral vascular accident) ([facility]) 04/05/2019   TIA (transient ischemic attack) 04/05/2019   Endometrial cancer ([facility]) 04/22/2017   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 [place], [place], [address] Phone: [phone]   Fax:  [phone]   Name: Lisa [name] MRN: [id number] Date of Birth: 1945/10/01

## 2021-01-24 ENCOUNTER — Ambulatory Visit: Payer: Medicare Other | Admitting: Speech Pathology

## 2021-01-29 NOTE — Progress Notes (Signed)
Carelink Summary Report / Loop Recorder 

## 2021-01-31 ENCOUNTER — Other Ambulatory Visit: Payer: Self-pay | Admitting: Family Medicine

## 2021-01-31 DIAGNOSIS — Z1231 Encounter for screening mammogram for malignant neoplasm of breast: Secondary | ICD-10-CM

## 2021-02-18 ENCOUNTER — Ambulatory Visit (INDEPENDENT_AMBULATORY_CARE_PROVIDER_SITE_OTHER): Payer: Medicare Other

## 2021-02-18 DIAGNOSIS — I639 Cerebral infarction, unspecified: Secondary | ICD-10-CM | POA: Diagnosis not present

## 2021-02-18 LAB — CUP PACEART REMOTE DEVICE CHECK
Date Time Interrogation Session: 20230129232004
Implantable Pulse Generator Implant Date: 20210318

## 2021-02-20 ENCOUNTER — Encounter: Payer: Self-pay | Admitting: Speech Pathology

## 2021-02-20 ENCOUNTER — Ambulatory Visit
Admission: RE | Admit: 2021-02-20 | Discharge: 2021-02-20 | Disposition: A | Discharge status: Home / Self Care | Payer: Medicare Other | Source: Ambulatory Visit | Attending: Family Medicine | Admitting: Family Medicine

## 2021-02-20 ENCOUNTER — Other Ambulatory Visit: Payer: Self-pay

## 2021-02-20 DIAGNOSIS — Z1231 Encounter for screening mammogram for malignant neoplasm of breast: Secondary | ICD-10-CM

## 2021-02-22 ENCOUNTER — Other Ambulatory Visit: Payer: Self-pay

## 2021-02-22 ENCOUNTER — Ambulatory Visit: Payer: Medicare Other | Attending: Family Medicine | Admitting: Speech Pathology

## 2021-02-22 DIAGNOSIS — R4701 Aphasia: Secondary | ICD-10-CM | POA: Insufficient documentation

## 2021-02-22 NOTE — Therapy (Signed)
[facility]. [place], [place], [address] Phone: [phone]   Fax:  [phone]  Speech Language Pathology Treatment & Recertification  Patient Details  Name: Lisa [name] MRN: [id number] Date of Birth: 08-23-1945 Referring Provider (SLP): Cari Caraway MD   Encounter Date: 02/22/2021   End of Session - 02/22/21 1127     Visit Number 33    Date for SLP Re-Evaluation 02/20/21    Authorization - Visit Number 1    Authorization - Number of Visits 8    SLP Start Time 8768    SLP Stop Time  1055    SLP Time Calculation (min) 40 min    Activity Tolerance Patient tolerated treatment well             Past Medical History:  Diagnosis Date   Stroke ([facility]) 04/06/2019   Uterine cancer ([facility]) 01/25/2016    Past Surgical History:  Procedure Laterality Date   BREAST EXCISIONAL BIOPSY Left 1975   LOOP RECORDER INSERTION N/A 04/07/2019   Procedure: LOOP RECORDER INSERTION;  Surgeon: Evans Lance, MD;  Location: [facility];  Service: Cardiovascular;  Laterality: N/A;   TUBAL LIGATION  1970    There were no vitals filed for this visit.   Subjective Assessment - 02/22/21 1114     Subjective "I feel like I've been doing pretty good. Still have trouble finding my words."    Currently in Pain? No/denies                   ADULT SLP TREATMENT - 02/22/21 1114       General Information   Behavior/Cognition Alert;Cooperative;Pleasant mood      Treatment Provided   Treatment provided Cognitive-Linquistic      Cognitive-Linquistic Treatment   Treatment focused on Aphasia    Skilled Treatment Completed reassessment for continued care. Pt scored a 7.89 indicating "mild severity". Deficits noted in decreased aud comp with longer, more complex sentences and halting word finding deficits. Pt scored a 13/15 on BNT (short form). Will address word finding and writing.      Assessment / Recommendations /  Plan   Plan Continue with current plan of care;Other (Comment)      Progression Toward Goals   Progression toward goals Progressing toward goals                SLP Short Term Goals - 02/22/21 1128       SLP SHORT TERM GOAL #1   Title During a structured divergent naming task, pt will provide 10 objects in personally-relevant categories with minA.    Time 2    Period Weeks    Status New   Not met; cont   Target Date 03/08/21      SLP SHORT TERM GOAL #2   Title Pt will select and use 1-2 word finding strategies and use in a structured conversation with modA    Time 2    Period Weeks    Status New    Target Date 03/08/21      SLP SHORT TERM GOAL #3   Title Pt will spell 5+ letter words using ACRT to increase written expression given minA.   Error: STG was supposed to be 2 and LTG was supposed to be 3.   Time 2    Period Weeks    Status New    Target Date 03/08/21  SLP Long Term Goals - 02/22/21 1131       SLP LONG TERM GOAL #1   Title Pt will describe 2-3 therapeutic tasks she is able to complete at home to continue working towards language goals independently.    Time 4    Period Weeks    Status New   To cont   Target Date 03/22/21      SLP LONG TERM GOAL #2   Title Pt will use 1-2 word finding strategies  in 20 min conversation given <2 verbal reminders during instances of anomia with SLP.    Time 4    Period Weeks    Status Revised    Target Date 03/22/21              Plan - 02/22/21 1127     Clinical Impression Statement See tx note. Pt returned for tx today. SLP completed assessment. Pt reports she continues to struggle with word finding in conversation and would like more practice using word finding strategies functionally. She would also like to focus on writing/spelling. SLP continues to rec skilled speech services due to expressive language deficits that impact her ability to participate functionally with others.    Speech  Therapy Frequency 2x / week    Duration 4 weeks    Treatment/Interventions Compensatory strategies;Cueing hierarchy;Functional tasks;Patient/family education;Environmental controls;Multimodal communcation approach;Language facilitation;Compensatory techniques;Internal/external aids;SLP instruction and feedback    Potential to Achieve Goals Good    Consulted and Agree with Plan of Care Patient;Family member/caregiver    Family Member Consulted Husband             Patient will benefit from skilled therapeutic intervention in order to improve the following deficits and impairments:   Aphasia    Problem List Patient Active Problem List   Diagnosis Date Noted   Amnesia 08/02/2020   Acute CVA (cerebrovascular accident) ([facility]) 04/07/2019   CVA (cerebral vascular accident) ([facility]) 04/05/2019   TIA (transient ischemic attack) 04/05/2019   Endometrial cancer ([facility]) 04/22/2017   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 [place], [place], [address] Phone: [phone]   Fax:  [phone]   Name: Lisa [name] MRN: [id number] Date of Birth: 1945/08/30

## 2021-02-25 ENCOUNTER — Other Ambulatory Visit: Payer: Self-pay

## 2021-02-25 ENCOUNTER — Ambulatory Visit: Payer: Medicare Other | Admitting: Speech Pathology

## 2021-02-25 ENCOUNTER — Encounter: Payer: Self-pay | Admitting: Speech Pathology

## 2021-02-25 DIAGNOSIS — R4701 Aphasia: Secondary | ICD-10-CM | POA: Diagnosis not present

## 2021-02-26 NOTE — Progress Notes (Signed)
Carelink Summary Report / Loop Recorder 

## 2021-02-26 NOTE — Therapy (Signed)
[facility]. [place], [place], [address] Phone: [phone]   Fax:  [phone]  Speech Language Pathology Treatment  Patient Details  Name: Lisa [name] MRN: [id number] Date of Birth: Jul 24, 1945 Referring Provider (SLP): Cari Caraway MD   Encounter Date: 02/25/2021   End of Session - 02/26/21 1436     Visit Number 34    Date for SLP Re-Evaluation 02/20/21    Authorization - Visit Number 2    Authorization - Number of Visits 8    SLP Start Time 1245    SLP Stop Time  1055    SLP Time Calculation (min) 40 min    Activity Tolerance Patient tolerated treatment well             Past Medical History:  Diagnosis Date   Stroke ([place] [facility]) 04/06/2019   Uterine cancer ([facility]) 01/25/2016    Past Surgical History:  Procedure Laterality Date   BREAST EXCISIONAL BIOPSY Left 1975   LOOP RECORDER INSERTION N/A 04/07/2019   Procedure: LOOP RECORDER INSERTION;  Surgeon: Evans Lance, MD;  Location: [facility];  Service: Cardiovascular;  Laterality: N/A;   TUBAL LIGATION  1970    There were no vitals filed for this visit.   Subjective Assessment - 02/25/21 1019     Subjective "We went to dinner last night."    Currently in Pain? No/denies                   ADULT SLP TREATMENT - 02/26/21 1433       General Information   Behavior/Cognition Alert;Cooperative;Pleasant mood      Treatment Provided   Treatment provided Cognitive-Linquistic      Cognitive-Linquistic Treatment   Treatment focused on Aphasia    Skilled Treatment After demonstrating/education on Anagram and Copy Recall Treatment, pt reported that she does not want to work on writing because she "doesn't have to write that much." Pt was unable to fill out a check independently and required verbal cueing for spelling. To continue to incorporate writing/spelling in other tasks. Pt in agreement. To reedu on word finding strategies and  choose 2 to focus on.      Assessment / Recommendations / Plan   Plan Continue with current plan of care;Other (Comment)      Progression Toward Goals   Progression toward goals Progressing toward goals                SLP Short Term Goals - 02/22/21 1128       SLP SHORT TERM GOAL #1   Title During a structured divergent naming task, pt will provide 10 objects in personally-relevant categories with minA.    Time 2    Period Weeks    Status New   Not met; cont   Target Date 03/08/21      SLP SHORT TERM GOAL #2   Title Pt will select and use 1-2 word finding strategies and use in a structured conversation with modA    Time 2    Period Weeks    Status New    Target Date 03/08/21      SLP SHORT TERM GOAL #3   Title Pt will spell 5+ letter words using ACRT to increase written expression given minA.   Error: STG was supposed to be 2 and LTG was supposed to be 3.   Time 2    Period Weeks    Status New  Target Date 03/08/21              SLP Long Term Goals - 02/22/21 1131       SLP LONG TERM GOAL #1   Title Pt will describe 2-3 therapeutic tasks she is able to complete at home to continue working towards language goals independently.    Time 4    Period Weeks    Status New   To cont   Target Date 03/22/21      SLP LONG TERM GOAL #2   Title Pt will use 1-2 word finding strategies  in 20 min conversation given <2 verbal reminders during instances of anomia with SLP.    Time 4    Period Weeks    Status Revised    Target Date 03/22/21              Plan - 02/26/21 1436     Clinical Impression Statement See tx note. Pt appears to have continued to improve over the last month; however, does get "stuck" and have difficulty working through. SLP continues to rec skilled speech services due to expressive language deficits that impact her ability to participate functionally with others.    Speech Therapy Frequency 2x / week    Duration 4 weeks     Treatment/Interventions Compensatory strategies;Cueing hierarchy;Functional tasks;Patient/family education;Environmental controls;Multimodal communcation approach;Language facilitation;Compensatory techniques;Internal/external aids;SLP instruction and feedback    Potential to Achieve Goals Good    Consulted and Agree with Plan of Care Patient;Family member/caregiver    Family Member Consulted Husband             Patient will benefit from skilled therapeutic intervention in order to improve the following deficits and impairments:   Aphasia    Problem List Patient Active Problem List   Diagnosis Date Noted   Amnesia 08/02/2020   Acute CVA (cerebrovascular accident) ([facility]) 04/07/2019   CVA (cerebral vascular accident) ([facility] [facility]) 04/05/2019   TIA (transient ischemic attack) 04/05/2019   Endometrial cancer ([facility] [place]) 04/22/2017   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 [place], [place], [address] Phone: [phone]   Fax:  [phone]   Name: NEOMI LAIDLER MRN: [id number] Date of Birth: 04/01/1945

## 2021-02-27 ENCOUNTER — Encounter: Payer: Self-pay | Admitting: Speech Pathology

## 2021-03-01 ENCOUNTER — Ambulatory Visit: Payer: Medicare Other | Admitting: Speech Pathology

## 2021-03-01 ENCOUNTER — Other Ambulatory Visit: Payer: Self-pay

## 2021-03-01 DIAGNOSIS — R4701 Aphasia: Secondary | ICD-10-CM | POA: Diagnosis not present

## 2021-03-01 NOTE — Therapy (Signed)
[facility]. [place], [place], [address] Phone: [phone]   Fax:  [phone]  Speech Language Pathology Treatment  Patient Details  Name: Lisa [name] MRN: [id number] Date of Birth: May 05, 1945 Referring Provider (SLP): Cari Caraway MD   Encounter Date: 03/01/2021   End of Session - 03/01/21 1121     Visit Number 35    Date for SLP Re-Evaluation 02/20/21    Authorization - Visit Number 3    Authorization - Number of Visits 8    SLP Start Time 5638    SLP Stop Time  1055    SLP Time Calculation (min) 40 min    Activity Tolerance Patient tolerated treatment well             Past Medical History:  Diagnosis Date   Stroke ([facility]) 04/06/2019   Uterine cancer ([facility]) 01/25/2016    Past Surgical History:  Procedure Laterality Date   BREAST EXCISIONAL BIOPSY Left 1975   LOOP RECORDER INSERTION N/A 04/07/2019   Procedure: LOOP RECORDER INSERTION;  Surgeon: Evans Lance, MD;  Location: [facility];  Service: Cardiovascular;  Laterality: N/A;   TUBAL LIGATION  1970    There were no vitals filed for this visit.   Subjective Assessment - 03/01/21 1121     Subjective "Doing pretty good."    Currently in Pain? No/denies                   ADULT SLP TREATMENT - 03/01/21 1118       General Information   Behavior/Cognition Alert;Cooperative;Pleasant mood      Treatment Provided   Treatment provided Cognitive-Linquistic      Cognitive-Linquistic Treatment   Treatment focused on Aphasia    Skilled Treatment Assisted pt with getting logged in to [facility]. Pt reported "I can't figure it out". SLP found pt was using wrong username. We discussed writing the username and password into her notes app so if this happened again, she could have the note for reference. Utilized SFA handout to describe objects today. Pt tended to immediately shut down if she wasn't able to think of the word in <2  seconds, saying "I don't know what that is". SLP encouraged pt that she DID know what the object was and encouraged her to use SFA prompts to assist. When prompted, pt was able to name 3-4 features of every given object. Will continue to increase generalization into conversation.      Assessment / Recommendations / Plan   Plan Continue with current plan of care;Other (Comment)      Progression Toward Goals   Progression toward goals Progressing toward goals                SLP Short Term Goals - 02/22/21 1128       SLP SHORT TERM GOAL #1   Title During a structured divergent naming task, pt will provide 10 objects in personally-relevant categories with minA.    Time 2    Period Weeks    Status New   Not met; cont   Target Date 03/08/21      SLP SHORT TERM GOAL #2   Title Pt will select and use 1-2 word finding strategies and use in a structured conversation with modA    Time 2    Period Weeks    Status New    Target Date 03/08/21      SLP SHORT TERM GOAL #3  Title Pt will spell 5+ letter words using ACRT to increase written expression given minA.   Error: STG was supposed to be 2 and LTG was supposed to be 3.   Time 2    Period Weeks    Status New    Target Date 03/08/21              SLP Long Term Goals - 02/22/21 1131       SLP LONG TERM GOAL #1   Title Pt will describe 2-3 therapeutic tasks she is able to complete at home to continue working towards language goals independently.    Time 4    Period Weeks    Status New   To cont   Target Date 03/22/21      SLP LONG TERM GOAL #2   Title Pt will use 1-2 word finding strategies  in 20 min conversation given <2 verbal reminders during instances of anomia with SLP.    Time 4    Period Weeks    Status Revised    Target Date 03/22/21              Plan - 03/01/21 1122     Clinical Impression Statement See tx note. Cont with current POC. SLP continues to rec skilled speech services due to expressive  language deficits that impact her ability to participate functionally with others.    Speech Therapy Frequency 2x / week    Duration 4 weeks    Treatment/Interventions Compensatory strategies;Cueing hierarchy;Functional tasks;Patient/family education;Environmental controls;Multimodal communcation approach;Language facilitation;Compensatory techniques;Internal/external aids;SLP instruction and feedback    Potential to Achieve Goals Good    Consulted and Agree with Plan of Care Patient;Family member/caregiver    Family Member Consulted Husband             Patient will benefit from skilled therapeutic intervention in order to improve the following deficits and impairments:   Aphasia    Problem List Patient Active Problem List   Diagnosis Date Noted   Amnesia 08/02/2020   Acute CVA (cerebrovascular accident) ([facility]) 04/07/2019   CVA (cerebral vascular accident) ([facility]) 04/05/2019   TIA (transient ischemic attack) 04/05/2019   Endometrial cancer ([facility]) 04/22/2017   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 [place], [place], [address] Phone: [phone]   Fax:  [phone]   Name: Lisa [name] MRN: [id number] Date of Birth: 26-Aug-1945

## 2021-03-06 ENCOUNTER — Encounter: Payer: Self-pay | Admitting: Speech Pathology

## 2021-03-06 ENCOUNTER — Other Ambulatory Visit: Payer: Self-pay

## 2021-03-06 ENCOUNTER — Ambulatory Visit: Payer: Medicare Other | Admitting: Speech Pathology

## 2021-03-06 DIAGNOSIS — R4701 Aphasia: Secondary | ICD-10-CM

## 2021-03-07 NOTE — Therapy (Signed)
[facility]. [place], [place], [address] Phone: [phone]   Fax:  [phone]  Speech Language Pathology Treatment  Patient Details  Name: Lisa [name] MRN: [id number] Date of Birth: 12/18/1945 Referring Provider (SLP): Cari Caraway MD   Encounter Date: 03/06/2021   End of Session - 03/07/21 0830     Visit Number 33    Date for SLP Re-Evaluation 03/22/21   this is covering from 02/22/21 to 03/22/21   Authorization - Visit Number 4    Authorization - Number of Visits 8    SLP Start Time 5300    SLP Stop Time  1055    SLP Time Calculation (min) 40 min    Activity Tolerance Patient tolerated treatment well             Past Medical History:  Diagnosis Date   Stroke ([facility]) 04/06/2019   Uterine cancer ([facility]) 01/25/2016    Past Surgical History:  Procedure Laterality Date   BREAST EXCISIONAL BIOPSY Left 1975   LOOP RECORDER INSERTION N/A 04/07/2019   Procedure: LOOP RECORDER INSERTION;  Surgeon: Evans Lance, MD;  Location: [facility];  Service: Cardiovascular;  Laterality: N/A;   TUBAL LIGATION  1970    There were no vitals filed for this visit.   Subjective Assessment - 03/06/21 1018     Subjective "I didn't get any flowers!"    Currently in Pain? No/denies                   ADULT SLP TREATMENT - 03/07/21 0828       General Information   Behavior/Cognition Alert;Cooperative;Pleasant mood      Treatment Provided   Treatment provided Cognitive-Linquistic      Cognitive-Linquistic Treatment   Treatment focused on Aphasia    Skilled Treatment Facilitated expressive language via generative naming task. Pt was able to generate 10 words in a concrete category (vegetables). She required minA; though she had significantly less difficulty generating than in previous sessions. Pt continues to have difficulty describing objects, particularly identifying the "group/category" they  belong to. She was able to generate 2-3 features of each object given prompting questions.      Assessment / Recommendations / Plan   Plan Continue with current plan of care;Other (Comment)      Progression Toward Goals   Progression toward goals Progressing toward goals                SLP Short Term Goals - 02/22/21 1128       SLP SHORT TERM GOAL #1   Title During a structured divergent naming task, pt will provide 10 objects in personally-relevant categories with minA.    Time 2    Period Weeks    Status New   Not met; cont   Target Date 03/08/21      SLP SHORT TERM GOAL #2   Title Pt will select and use 1-2 word finding strategies and use in a structured conversation with modA    Time 2    Period Weeks    Status New    Target Date 03/08/21      SLP SHORT TERM GOAL #3   Title Pt will spell 5+ letter words using ACRT to increase written expression given minA.   Error: STG was supposed to be 2 and LTG was supposed to be 3.   Time 2    Period Weeks    Status New  Target Date 03/08/21              SLP Long Term Goals - 02/22/21 1131       SLP LONG TERM GOAL #1   Title Pt will describe 2-3 therapeutic tasks she is able to complete at home to continue working towards language goals independently.    Time 4    Period Weeks    Status New   To cont   Target Date 03/22/21      SLP LONG TERM GOAL #2   Title Pt will use 1-2 word finding strategies  in 20 min conversation given <2 verbal reminders during instances of anomia with SLP.    Time 4    Period Weeks    Status Revised    Target Date 03/22/21              Plan - 03/07/21 4401     Clinical Impression Statement See tx note. Cont with current POC. SLP continues to rec skilled speech services due to expressive language deficits that impact her ability to participate functionally with others.    Speech Therapy Frequency 2x / week    Duration 4 weeks    Treatment/Interventions Compensatory  strategies;Cueing hierarchy;Functional tasks;Patient/family education;Environmental controls;Multimodal communcation approach;Language facilitation;Compensatory techniques;Internal/external aids;SLP instruction and feedback    Potential to Achieve Goals Good    Consulted and Agree with Plan of Care Patient;Family member/caregiver    Family Member Consulted Husband             Patient will benefit from skilled therapeutic intervention in order to improve the following deficits and impairments:   Aphasia    Problem List Patient Active Problem List   Diagnosis Date Noted   Amnesia 08/02/2020   Acute CVA (cerebrovascular accident) ([facility]) 04/07/2019   CVA (cerebral vascular accident) ([facility]) 04/05/2019   TIA (transient ischemic attack) 04/05/2019   Endometrial cancer ([facility]) 04/22/2017   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 [place], [place], [address] Phone: [phone]   Fax:  [phone]   Name: Lisa [name] MRN: [id number] Date of Birth: 1945-11-12

## 2021-03-08 ENCOUNTER — Encounter: Payer: Self-pay | Admitting: Speech Pathology

## 2021-03-08 ENCOUNTER — Other Ambulatory Visit: Payer: Self-pay

## 2021-03-08 ENCOUNTER — Ambulatory Visit: Payer: Medicare Other | Admitting: Speech Pathology

## 2021-03-08 DIAGNOSIS — R4701 Aphasia: Secondary | ICD-10-CM | POA: Diagnosis not present

## 2021-03-08 NOTE — Therapy (Signed)
[name]. [place], [place], [address] Phone: [phone]   Fax:  [phone]  Speech Language Pathology Treatment  Patient Details  Name: Lisa [name] MRN: [id number] Date of Birth: [birth year] Referring Provider (SLP): Cari Caraway MD   Encounter Date: 03/08/2021   End of Session - 03/08/21 1013     Visit Number 62    Date for SLP Re-Evaluation 03/22/21   this is covering from 02/22/21 to 03/22/21   Authorization - Visit Number 5    Authorization - Number of Visits 8    SLP Start Time 1010    SLP Stop Time  1050    SLP Time Calculation (min) 40 min    Activity Tolerance Patient tolerated treatment well             Past Medical History:  Diagnosis Date   Stroke ([facility]) 04/06/2019   Uterine cancer ([facility]) 01/25/2016    Past Surgical History:  Procedure Laterality Date   BREAST EXCISIONAL BIOPSY Left 1975   LOOP RECORDER INSERTION N/A 04/07/2019   Procedure: LOOP RECORDER INSERTION;  Surgeon: Evans Lance, MD;  Location: [facility];  Service: Cardiovascular;  Laterality: N/A;   TUBAL LIGATION  1970    There were no vitals filed for this visit.   Subjective Assessment - 03/08/21 1013     Subjective "We worked on our thing? What was it?" ([place])    Currently in Pain? No/denies                   ADULT SLP TREATMENT - 03/08/21 1047       General Information   Behavior/Cognition Alert;Cooperative;Pleasant mood      Treatment Provided   Treatment provided Cognitive-Linquistic      Cognitive-Linquistic Treatment   Treatment focused on Aphasia    Skilled Treatment Facilitated expressive language via RET today. Pt increased content words from 3.75 to 4.75 (which is an increase from previous sessions). Pt described objects to SLP with supported assist from husband (asking prompting questions). Pt was able to complete with husband providing prompting questions; however, still  "gets stuck" when attempting to describe independently. Will cont to address.      Assessment / Recommendations / Plan   Plan Continue with current plan of care;Other (Comment)      Progression Toward Goals   Progression toward goals Progressing toward goals                SLP Short Term Goals - 02/22/21 1128       SLP SHORT TERM GOAL #1   Title During a structured divergent naming task, pt will provide 10 objects in personally-relevant categories with minA.    Time 2    Period Weeks    Status New   Not met; cont   Target Date 03/08/21      SLP SHORT TERM GOAL #2   Title Pt will select and use 1-2 word finding strategies and use in a structured conversation with modA    Time 2    Period Weeks    Status New    Target Date 03/08/21      SLP SHORT TERM GOAL #3   Title Pt will spell 5+ letter words using ACRT to increase written expression given minA.   Error: STG was supposed to be 2 and LTG was supposed to be 3.   Time 2    Period Weeks    Status  New    Target Date 03/08/21              SLP Long Term Goals - 02/22/21 1131       SLP LONG TERM GOAL #1   Title Pt will describe 2-3 therapeutic tasks she is able to complete at home to continue working towards language goals independently.    Time 4    Period Weeks    Status New   To cont   Target Date 03/22/21      SLP LONG TERM GOAL #2   Title Pt will use 1-2 word finding strategies  in 20 min conversation given <2 verbal reminders during instances of anomia with SLP.    Time 4    Period Weeks    Status Revised    Target Date 03/22/21              Plan - 03/08/21 1014     Clinical Impression Statement See tx note. Cont with current POC. SLP continues to rec skilled speech services due to expressive language deficits that impact her ability to participate functionally with others.    Speech Therapy Frequency 2x / week    Duration 4 weeks    Treatment/Interventions Compensatory strategies;Cueing  hierarchy;Functional tasks;Patient/family education;Environmental controls;Multimodal communcation approach;Language facilitation;Compensatory techniques;Internal/external aids;SLP instruction and feedback    Potential to Achieve Goals Good    Consulted and Agree with Plan of Care Patient;Family member/caregiver    Family Member Consulted Husband             Patient will benefit from skilled therapeutic intervention in order to improve the following deficits and impairments:   Aphasia    Problem List Patient Active Problem List   Diagnosis Date Noted   Amnesia 08/02/2020   Acute CVA (cerebrovascular accident) ([facility]) 04/07/2019   CVA (cerebral vascular accident) ([facility]) 04/05/2019   TIA (transient ischemic attack) 04/05/2019   Endometrial cancer ([facility]) 04/22/2017   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 [place], [place], [address] Phone: [phone]   Fax:  [phone]   Name: Lisa [name] MRN: [id number] Date of Birth: 1946/01/19

## 2021-03-13 ENCOUNTER — Ambulatory Visit: Payer: Medicare Other | Admitting: Speech Pathology

## 2021-03-15 ENCOUNTER — Encounter: Payer: Self-pay | Admitting: Speech Pathology

## 2021-03-15 ENCOUNTER — Other Ambulatory Visit: Payer: Self-pay

## 2021-03-15 ENCOUNTER — Ambulatory Visit: Payer: Medicare Other | Admitting: Speech Pathology

## 2021-03-15 DIAGNOSIS — R4701 Aphasia: Secondary | ICD-10-CM

## 2021-03-15 NOTE — Therapy (Signed)
[facility]. [place], [place], [address] Phone: [phone]   Fax:  [phone]  Speech Language Pathology Treatment  Patient Details  Name: Lisa [name] MRN: [id number] Date of Birth: Apr 18, 1945 Referring Provider (SLP): Cari Caraway MD   Encounter Date: 03/15/2021   End of Session - 03/15/21 1020     Visit Number 76    Date for SLP Re-Evaluation 03/22/21   this is covering from 02/22/21 to 03/22/21   Authorization - Visit Number 6    Authorization - Number of Visits 8    SLP Start Time 4158    SLP Stop Time  1055    SLP Time Calculation (min) 40 min    Activity Tolerance Patient tolerated treatment well             Past Medical History:  Diagnosis Date   Stroke ([facility]) 04/06/2019   Uterine cancer ([facility]) 01/25/2016    Past Surgical History:  Procedure Laterality Date   BREAST EXCISIONAL BIOPSY Left 1975   LOOP RECORDER INSERTION N/A 04/07/2019   Procedure: LOOP RECORDER INSERTION;  Surgeon: Evans Lance, MD;  Location: [facility];  Service: Cardiovascular;  Laterality: N/A;   TUBAL LIGATION  1970    There were no vitals filed for this visit.   Subjective Assessment - 03/15/21 1019     Subjective "Doing good."    Currently in Pain? No/denies                   ADULT SLP TREATMENT - 03/15/21 1120       General Information   Behavior/Cognition Alert;Cooperative;Pleasant mood      Treatment Provided   Treatment provided Cognitive-Linquistic      Cognitive-Linquistic Treatment   Treatment focused on Aphasia    Skilled Treatment Facilitated vebal expression and lexical retrieval with during organizational task. Pt requires min-to-modA on concrete categories. Continues to demonstrate variable performance. Will continue to address to increase ability to word find. Provided with HEP.      Assessment / Recommendations / Plan   Plan Continue with current plan of care;Other  (Comment)      Progression Toward Goals   Progression toward goals Progressing toward goals                SLP Short Term Goals - 03/15/21 1021       SLP SHORT TERM GOAL #1   Title During a structured divergent naming task, pt will provide 10 objects in personally-relevant categories with minA.    Time 2    Period Weeks    Status Not Met   Not met consistently   Target Date 03/08/21      SLP SHORT TERM GOAL #2   Title Pt will select and use 1-2 word finding strategies and use in a structured conversation with modA    Time 2    Period Weeks    Status Achieved    Target Date 03/08/21      SLP SHORT TERM GOAL #3   Title Pt will spell 5+ letter words using ACRT to increase written expression given minA.   Error: STG was supposed to be 2 and LTG was supposed to be 3.   Time 2    Period Weeks    Status Deferred    Target Date 03/08/21              SLP Long Term Goals - 02/22/21 1131  SLP LONG TERM GOAL #1   Title Pt will describe 2-3 therapeutic tasks she is able to complete at home to continue working towards language goals independently.    Time 4    Period Weeks    Status New   To cont   Target Date 03/22/21      SLP LONG TERM GOAL #2   Title Pt will use 1-2 word finding strategies  in 20 min conversation given <2 verbal reminders during instances of anomia with SLP.    Time 4    Period Weeks    Status Revised    Target Date 03/22/21              Plan - 03/15/21 1021     Clinical Impression Statement See tx note. Cont with current POC. SLP continues to rec skilled speech services due to expressive language deficits that impact her ability to participate functionally with others.    Speech Therapy Frequency 2x / week    Duration 4 weeks    Treatment/Interventions Compensatory strategies;Cueing hierarchy;Functional tasks;Patient/family education;Environmental controls;Multimodal communcation approach;Language facilitation;Compensatory  techniques;Internal/external aids;SLP instruction and feedback    Potential to Achieve Goals Good    Consulted and Agree with Plan of Care Patient;Family member/caregiver    Family Member Consulted Husband             Patient will benefit from skilled therapeutic intervention in order to improve the following deficits and impairments:   Aphasia    Problem List Patient Active Problem List   Diagnosis Date Noted   Amnesia 08/02/2020   Acute CVA (cerebrovascular accident) ([facility]) 04/07/2019   CVA (cerebral vascular accident) ([facility]) 04/05/2019   TIA (transient ischemic attack) 04/05/2019   Endometrial cancer ([facility]) 04/22/2017   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 [place], [place], [address] Phone: [phone]   Fax:  [phone]   Name: Lisa [name] MRN: [id number] Date of Birth: [birth year]

## 2021-03-18 ENCOUNTER — Ambulatory Visit: Payer: Medicare Other | Admitting: Speech Pathology

## 2021-03-18 ENCOUNTER — Other Ambulatory Visit: Payer: Self-pay

## 2021-03-18 DIAGNOSIS — R4701 Aphasia: Secondary | ICD-10-CM | POA: Diagnosis not present

## 2021-03-19 ENCOUNTER — Encounter: Payer: Self-pay | Admitting: Speech Pathology

## 2021-03-19 NOTE — Therapy (Signed)
[place]. [place], [place], [address] Phone: [phone]   Fax:  [phone]  Speech Language Pathology Treatment  Patient Details  Name: Lisa [name] MRN: [id number] Date of Birth: April 25, 1945 Referring Provider (SLP): Cari Caraway MD   Encounter Date: 03/18/2021   End of Session - 03/19/21 1524     Visit Number 28    Date for SLP Re-Evaluation 03/22/21   this is covering from 02/22/21 to 03/22/21   Authorization - Visit Number 7    Authorization - Number of Visits 8    SLP Start Time 1100    SLP Stop Time  1140    SLP Time Calculation (min) 40 min    Activity Tolerance Patient tolerated treatment well             Past Medical History:  Diagnosis Date   Stroke ([facility]) 04/06/2019   Uterine cancer ([facility]) 01/25/2016    Past Surgical History:  Procedure Laterality Date   BREAST EXCISIONAL BIOPSY Left 1975   LOOP RECORDER INSERTION N/A 04/07/2019   Procedure: LOOP RECORDER INSERTION;  Surgeon: Evans Lance, MD;  Location: [facility];  Service: Cardiovascular;  Laterality: N/A;   TUBAL LIGATION  1970    There were no vitals filed for this visit.   Subjective Assessment - 03/19/21 1516     Subjective "Good."    Currently in Pain? No/denies                   ADULT SLP TREATMENT - 03/19/21 1521       General Information   Behavior/Cognition Alert;Cooperative;Pleasant mood      Treatment Provided   Treatment provided Cognitive-Linquistic      Cognitive-Linquistic Treatment   Treatment focused on Aphasia    Skilled Treatment Completed convergent naming tasks where pt was asked to identify what 3-4 objects all had in common. Pt required min-to-modA verbal cues on identifying similarities between objects and benefited from multiple choice cue and sentence completion cues. Will continue to address organization to promote increased word retrieval.      Assessment / Recommendations  / [place] with current plan of care;Other (Comment)      Progression Toward Goals   Progression toward goals Progressing toward goals                SLP Short Term Goals - 03/15/21 1021       SLP SHORT TERM GOAL #1   Title During a structured divergent naming task, pt will provide 10 objects in personally-relevant categories with minA.    Time 2    Period Weeks    Status Not Met   Not met consistently   Target Date 03/08/21      SLP SHORT TERM GOAL #2   Title Pt will select and use 1-2 word finding strategies and use in a structured conversation with modA    Time 2    Period Weeks    Status Achieved    Target Date 03/08/21      SLP SHORT TERM GOAL #3   Title Pt will spell 5+ letter words using ACRT to increase written expression given minA.   Error: STG was supposed to be 2 and LTG was supposed to be 3.   Time 2    Period Weeks    Status Deferred    Target Date 03/08/21  SLP Long Term Goals - 02/22/21 1131       SLP LONG TERM GOAL #1   Title Pt will describe 2-3 therapeutic tasks she is able to complete at home to continue working towards language goals independently.    Time 4    Period Weeks    Status New   To cont   Target Date 03/22/21      SLP LONG TERM GOAL #2   Title Pt will use 1-2 word finding strategies  in 20 min conversation given <2 verbal reminders during instances of anomia with SLP.    Time 4    Period Weeks    Status Revised    Target Date 03/22/21              Plan - 03/19/21 1524     Clinical Impression Statement See tx note. Cont with current POC. SLP continues to rec skilled speech services due to expressive language deficits that impact her ability to participate functionally with others.    Speech Therapy Frequency 2x / week    Duration 4 weeks    Treatment/Interventions Compensatory strategies;Cueing hierarchy;Functional tasks;Patient/family education;Environmental controls;Multimodal communcation  approach;Language facilitation;Compensatory techniques;Internal/external aids;SLP instruction and feedback    Potential to Achieve Goals Good    Consulted and Agree with Plan of Care Patient;Family member/caregiver    Family Member Consulted Husband             Patient will benefit from skilled therapeutic intervention in order to improve the following deficits and impairments:   Aphasia    Problem List Patient Active Problem List   Diagnosis Date Noted   Amnesia 08/02/2020   Acute CVA (cerebrovascular accident) ([facility]) 04/07/2019   CVA (cerebral vascular accident) ([facility]) 04/05/2019   TIA (transient ischemic attack) 04/05/2019   Endometrial cancer ([facility]) 04/22/2017   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 [place], [place], [address] Phone: [phone]   Fax:  [phone]   Name: Lisa [name] MRN: [id number] Date of Birth: 1945-06-22

## 2021-03-21 ENCOUNTER — Ambulatory Visit: Payer: Medicare Other | Admitting: Speech Pathology

## 2021-03-21 ENCOUNTER — Encounter: Payer: Medicare Other | Admitting: Speech Pathology

## 2021-03-25 ENCOUNTER — Ambulatory Visit (INDEPENDENT_AMBULATORY_CARE_PROVIDER_SITE_OTHER): Payer: Medicare Other

## 2021-03-25 DIAGNOSIS — I639 Cerebral infarction, unspecified: Secondary | ICD-10-CM | POA: Diagnosis not present

## 2021-03-26 LAB — CUP PACEART REMOTE DEVICE CHECK
Date Time Interrogation Session: 20230303230840
Implantable Pulse Generator Implant Date: 20210318

## 2021-03-28 ENCOUNTER — Other Ambulatory Visit: Payer: Self-pay

## 2021-03-28 ENCOUNTER — Ambulatory Visit: Payer: Medicare Other | Attending: Family Medicine | Admitting: Speech Pathology

## 2021-03-28 DIAGNOSIS — R4701 Aphasia: Secondary | ICD-10-CM | POA: Diagnosis present

## 2021-03-28 NOTE — Therapy (Signed)
[facility] ?[place] ?[address] ?[place], [place], [address] ?Phone: [phone]   Fax:  [phone] ? ?Speech Language Pathology Treatment & Recertification ? ?Patient Details  ?Name: Lisa [name] ?MRN: [id number] ?Date of Birth: [birth year] ?Referring Provider (SLP): Cari Caraway MD ? ? ?Encounter Date: 03/28/2021 ? ? End of Session - 03/28/21 1359   ? ? Visit Number 40   ? Date for SLP Re-Evaluation 04/22/21   this is covering from 02/22/21 to 03/22/21  ? Authorization - Visit Number 1   ? Authorization - Number of Visits 8   ? SLP Start Time 0930   ? SLP Stop Time  1012   ? SLP Time Calculation (min) 42 min   ? Activity Tolerance Patient tolerated treatment well   ? ?  ?  ? ?  ? ? ?Past Medical History:  ?Diagnosis Date  ? Stroke ([facility]) 04/06/2019  ? Uterine cancer ([facility]) 01/25/2016  ? ? ?Past Surgical History:  ?Procedure Laterality Date  ? BREAST EXCISIONAL BIOPSY Left 1975  ? LOOP RECORDER INSERTION N/A 04/07/2019  ? Procedure: LOOP RECORDER INSERTION;  Surgeon: Evans Lance, MD;  Location: [facility];  Service: Cardiovascular;  Laterality: N/A;  ? TUBAL LIGATION  1970  ? ? ?There were no vitals filed for this visit. ? ? Subjective Assessment - 03/28/21 1358   ? ? Subjective No new information to report.   ? Currently in Pain? No/denies   ? ?  ?  ? ?  ? ? ? ? ? ? ? ? ADULT SLP TREATMENT - 03/28/21 1356   ? ?  ? General Information  ? Behavior/Cognition Alert;Cooperative;Pleasant mood   ?  ? Treatment Provided  ? Treatment provided Cognitive-Linquistic   ?  ? Cognitive-Linquistic Treatment  ? Treatment focused on Aphasia   ? Skilled Treatment Completed convergent naming tasks where pt was asked to identify what 3-4 objects all had in common. Pt required min-to-modA verbal cues on identifying similarities between objects and benefited from multiple choice cue and sentence completion cues. Provided pt with object pictures and encouraged pt to identify category  of object. Pt require min-to-modA to complete task.  Will continue to address organization to promote increased word retrieval.   ?  ? Assessment / Recommendations / Plan  ? Plan Continue with current plan of care;Other (Comment)   ?  ? Progression Toward Goals  ? Progression toward goals Progressing toward goals   ? ?  ?  ? ?  ? ? ? ? ? SLP Short Term Goals - 03/15/21 1021   ? ?  ? SLP SHORT TERM GOAL #1  ? Title During a structured divergent naming task, pt will provide 10 objects in personally-relevant categories with minA.   ? Time 2   ? Period Weeks   ? Status Not Met   Not met consistently  ? Target Date 03/08/21   ?  ? SLP SHORT TERM GOAL #2  ? Title Pt will select and use 1-2 word finding strategies and use in a structured conversation with modA   ? Time 2   ? Period Weeks   ? Status Achieved   ? Target Date 03/08/21   ?  ? SLP SHORT TERM GOAL #3  ? Title Pt will spell 5+ letter words using ACRT to increase written expression given minA.   Error: STG was supposed to be 2 and LTG was supposed to be 3.  ? Time 2   ?  Period Weeks   ? Status Deferred   ? Target Date 03/08/21   ? ?  ?  ? ?  ? ? ? SLP Long Term Goals - 03/28/21 1401   ? ?  ? SLP LONG TERM GOAL #1  ? Title Pt will describe 2-3 therapeutic tasks she is able to complete at home to continue working towards language goals independently.   ? Time 4   ? Period Weeks   ? Status Not Met   Cont.  ? Target Date 04/22/21   ?  ? SLP LONG TERM GOAL #2  ? Title Pt will use 1-2 word finding strategies  in 20 min conversation given <2 verbal reminders during instances of anomia with SLP.   ? Time 4   ? Period Weeks   ? Status Not Met   Not met, cont  ? Target Date 04/22/21   ? ?  ?  ? ?  ? ? ? Plan - 03/28/21 1400   ? ? Clinical Impression Statement See tx note. Cont with current POC. SLP continues to rec skilled speech services due to expressive language deficits that impact her ability to participate functionally with others. To see additional month and d/c if  patient is no longer making progress.   ? Speech Therapy Frequency 2x / week   ? Duration 4 weeks   ? Treatment/Interventions Compensatory strategies;Cueing hierarchy;Functional tasks;Patient/family education;Environmental controls;Multimodal communcation approach;Language facilitation;Compensatory techniques;Internal/external aids;SLP instruction and feedback   ? Potential to Achieve Goals Good   ? Consulted and Agree with Plan of Care Patient;Family member/caregiver   ? Family Member Consulted Husband   ? ?  ?  ? ?  ? ?Speech Therapy Progress Note ? ?Dates of Reporting Period: 02/22/21 to present ? ?Subjective Statement: "I feel like she is doing pretty well at home." - Husband ? ?Objective: See previous notes. ? ?Goal Update: LTG not met; to cont for additional month ? ?Plan: Cont to address expressive communication to maximize functional communication ? ?Reason Skilled Services are Required: SLP rec cont speech therapy services to increase pt confidence utilizing word finding strategies to increase participation in conversation.  ? ?Patient will benefit from skilled therapeutic intervention in order to improve the following deficits and impairments:   ?Aphasia ? ? ? ?Problem List ?Patient Active Problem List  ? Diagnosis Date Noted  ? Amnesia 08/02/2020  ? Acute CVA (cerebrovascular accident) ([facility]) 04/07/2019  ? CVA (cerebral vascular accident) ([facility]) 04/05/2019  ? TIA (transient ischemic attack) 04/05/2019  ? Endometrial cancer ([facility]) 04/22/2017  ? 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 ? ?[name], [place] ?03/28/2021, 2:02 PM ? ?[facility] ?[place] ?[address] ?[place], [place], [address] ?Phone: [phone]   Fax:  [phone] ? ? ?Name: Lisa [name] ?MRN: [id number] ?Date of Birth: 20-Sep-1945 ? ?

## 2021-03-29 ENCOUNTER — Ambulatory Visit: Payer: Medicare Other | Admitting: Speech Pathology

## 2021-03-29 ENCOUNTER — Encounter: Payer: Self-pay | Admitting: Speech Pathology

## 2021-03-29 DIAGNOSIS — R4701 Aphasia: Secondary | ICD-10-CM

## 2021-03-29 NOTE — Therapy (Signed)
[facility] ?[place] ?[address] ?[place], [place], [address] ?Phone: [phone]   Fax:  [phone] ? ?Speech Language Pathology Treatment ? ?Patient Details  ?Name: Lisa [name] ?MRN: [id number] ?Date of Birth: December 03, 1945 ?Referring Provider (SLP): Cari Caraway MD ? ? ?Encounter Date: 03/29/2021 ? ? ? ?Past Medical History:  ?Diagnosis Date  ? Stroke ([facility]) 04/06/2019  ? Uterine cancer ([facility]) 01/25/2016  ? ? ?Past Surgical History:  ?Procedure Laterality Date  ? BREAST EXCISIONAL BIOPSY Left 1975  ? LOOP RECORDER INSERTION N/A 04/07/2019  ? Procedure: LOOP RECORDER INSERTION;  Surgeon: Evans Lance, MD;  Location: [facility];  Service: Cardiovascular;  Laterality: N/A;  ? TUBAL LIGATION  1970  ? ? ?There were no vitals filed for this visit. ? ? Subjective Assessment - 03/29/21 0933   ? ? Subjective No new information to report.   ? Currently in Pain? No/denies   ? ?  ?  ? ?  ? ? ? ? ? ? ? ? ADULT SLP TREATMENT - 03/29/21 1013   ? ?  ? General Information  ? Behavior/Cognition Alert;Cooperative;Pleasant mood   ?  ? Treatment Provided  ? Treatment provided Cognitive-Linquistic   ?  ? Cognitive-Linquistic Treatment  ? Treatment focused on Aphasia   ? Skilled Treatment Provided pt with object pictures and encouraged pt to identify category of object and gesture the object. Pt require minA verbal cues to complete task.  Pt was able to recall 1 word finding strategy and is to use gesturing for HEP. Will continue to address organization to promote increased word retrieval.   ?  ? Assessment / Recommendations / Plan  ? Plan Continue with current plan of care;Other (Comment)   ?  ? Progression Toward Goals  ? Progression toward goals Progressing toward goals   ? ?  ?  ? ?  ? ? ? ? ? SLP Short Term Goals - 03/15/21 1021   ? ?  ? SLP SHORT TERM GOAL #1  ? Title During a structured divergent naming task, pt will provide 10 objects in personally-relevant categories  with minA.   ? Time 2   ? Period Weeks   ? Status Not Met   Not met consistently  ? Target Date 03/08/21   ?  ? SLP SHORT TERM GOAL #2  ? Title Pt will select and use 1-2 word finding strategies and use in a structured conversation with modA   ? Time 2   ? Period Weeks   ? Status Achieved   ? Target Date 03/08/21   ?  ? SLP SHORT TERM GOAL #3  ? Title Pt will spell 5+ letter words using ACRT to increase written expression given minA.   Error: STG was supposed to be 2 and LTG was supposed to be 3.  ? Time 2   ? Period Weeks   ? Status Deferred   ? Target Date 03/08/21   ? ?  ?  ? ?  ? ? ? SLP Long Term Goals - 03/28/21 1401   ? ?  ? SLP LONG TERM GOAL #1  ? Title Pt will describe 2-3 therapeutic tasks she is able to complete at home to continue working towards language goals independently.   ? Time 4   ? Period Weeks   ? Status Not Met   Cont.  ? Target Date 04/22/21   ?  ? SLP LONG TERM GOAL #2  ? Title  Pt will use 1-2 word finding strategies  in 20 min conversation given <2 verbal reminders during instances of anomia with SLP.   ? Time 4   ? Period Weeks   ? Status Not Met   Not met, cont  ? Target Date 04/22/21   ? ?  ?  ? ?  ? ? ? ? ?Patient will benefit from skilled therapeutic intervention in order to improve the following deficits and impairments:   ?Aphasia ? ? ? ?Problem List ?Patient Active Problem List  ? Diagnosis Date Noted  ? Amnesia 08/02/2020  ? Acute CVA (cerebrovascular accident) ([facility]) 04/07/2019  ? CVA (cerebral vascular accident) ([facility]) 04/05/2019  ? TIA (transient ischemic attack) 04/05/2019  ? Endometrial cancer ([facility]) 04/22/2017  ? 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 ? ?[name], CCC-SLP ?03/29/2021, 10:15 AM ? ?[facility] ?[address] ?[address] ?[place], [place], [address] ?Phone: [phone]   Fax:  [phone] ? ? ?Name: Lisa [name] ?MRN: [id number] ?Date of Birth: Mar 13, 1945 ? ?

## 2021-04-02 ENCOUNTER — Ambulatory Visit: Payer: Medicare Other | Admitting: Speech Pathology

## 2021-04-02 ENCOUNTER — Other Ambulatory Visit: Payer: Self-pay

## 2021-04-02 ENCOUNTER — Encounter: Payer: Self-pay | Admitting: Speech Pathology

## 2021-04-02 DIAGNOSIS — R4701 Aphasia: Secondary | ICD-10-CM

## 2021-04-03 NOTE — Therapy (Signed)
[facility] ?[place] ?[address] ?[place], [place], [address] ?Phone: [phone]   Fax:  [phone] ? ?Speech Language Pathology Treatment ? ?Patient Details  ?Name: Lisa [name] ?MRN: [id number] ?Date of Birth: 22-Jan-1945 ?Referring Provider (SLP): Cari Caraway MD ? ? ?Encounter Date: 04/02/2021 ? ? End of Session - 04/02/21 0932   ? ? Visit Number 42   ? Date for SLP Re-Evaluation 04/22/21   this is covering from 02/22/21 to 03/22/21  ? Authorization - Visit Number 3   ? Authorization - Number of Visits 8   ? SLP Start Time 0930   ? SLP Stop Time  1010   ? SLP Time Calculation (min) 40 min   ? Activity Tolerance Patient tolerated treatment well   ? ?  ?  ? ?  ? ? ?Past Medical History:  ?Diagnosis Date  ? Stroke ([facility]) 04/06/2019  ? Uterine cancer ([facility]) 01/25/2016  ? ? ?Past Surgical History:  ?Procedure Laterality Date  ? BREAST EXCISIONAL BIOPSY Left 1975  ? LOOP RECORDER INSERTION N/A 04/07/2019  ? Procedure: LOOP RECORDER INSERTION;  Surgeon: Evans Lance, MD;  Location: [facility];  Service: Cardiovascular;  Laterality: N/A;  ? TUBAL LIGATION  1970  ? ? ?There were no vitals filed for this visit. ? ? Subjective Assessment - 04/02/21 0931   ? ? Subjective "We didn't do it."   ? Currently in Pain? No/denies   ? ?  ?  ? ?  ? ? ? ? ? ? ? ? ADULT SLP TREATMENT - 04/03/21 1504   ? ?  ? General Information  ? Behavior/Cognition Alert;Cooperative;Pleasant mood   ?  ? Treatment Provided  ? Treatment provided Cognitive-Linquistic   ?  ? Cognitive-Linquistic Treatment  ? Treatment focused on Aphasia   ? Skilled Treatment Pt was able to create sentences using RET treatment with an average of 3-4 content words (which is a significant increase). When having difficulty find a word, pt was challenged to utilize describe or gesture to find the given word. Husband reports pt has less word finding when on phone with sister.   ?  ? Assessment / Recommendations / Plan  ?  Plan Continue with current plan of care;Other (Comment)   ?  ? Progression Toward Goals  ? Progression toward goals Progressing toward goals   ? ?  ?  ? ?  ? ? ? ? ? SLP Short Term Goals - 03/15/21 1021   ? ?  ? SLP SHORT TERM GOAL #1  ? Title During a structured divergent naming task, pt will provide 10 objects in personally-relevant categories with minA.   ? Time 2   ? Period Weeks   ? Status Not Met   Not met consistently  ? Target Date 03/08/21   ?  ? SLP SHORT TERM GOAL #2  ? Title Pt will select and use 1-2 word finding strategies and use in a structured conversation with modA   ? Time 2   ? Period Weeks   ? Status Achieved   ? Target Date 03/08/21   ?  ? SLP SHORT TERM GOAL #3  ? Title Pt will spell 5+ letter words using ACRT to increase written expression given minA.   Error: STG was supposed to be 2 and LTG was supposed to be 3.  ? Time 2   ? Period Weeks   ? Status Deferred   ? Target Date 03/08/21   ? ?  ?  ? ?  ? ? ?  SLP Long Term Goals - 03/28/21 1401   ? ?  ? SLP LONG TERM GOAL #1  ? Title Pt will describe 2-3 therapeutic tasks she is able to complete at home to continue working towards language goals independently.   ? Time 4   ? Period Weeks   ? Status Not Met   Cont.  ? Target Date 04/22/21   ?  ? SLP LONG TERM GOAL #2  ? Title Pt will use 1-2 word finding strategies  in 20 min conversation given <2 verbal reminders during instances of anomia with SLP.   ? Time 4   ? Period Weeks   ? Status Not Met   Not met, cont  ? Target Date 04/22/21   ? ?  ?  ? ?  ? ? ? Plan - 04/02/21 0933   ? ? Clinical Impression Statement See tx note. Cont with current POC. SLP continues to rec skilled speech services due to expressive language deficits that impact her ability to participate functionally with others. To see additional month and d/c if patient is no longer making progress.   ? Speech Therapy Frequency 2x / week   ? Duration 4 weeks   ? Treatment/Interventions Compensatory strategies;Cueing  hierarchy;Functional tasks;Patient/family education;Environmental controls;Multimodal communcation approach;Language facilitation;Compensatory techniques;Internal/external aids;SLP instruction and feedback   ? Potential to Achieve Goals Good   ? Consulted and Agree with Plan of Care Patient;Family member/caregiver   ? Family Member Consulted Husband   ? ?  ?  ? ?  ? ? ?Patient will benefit from skilled therapeutic intervention in order to improve the following deficits and impairments:   ?Aphasia ? ? ? ?Problem List ?Patient Active Problem List  ? Diagnosis Date Noted  ? Amnesia 08/02/2020  ? Acute CVA (cerebrovascular accident) ([facility]) 04/07/2019  ? CVA (cerebral vascular accident) ([facility]) 04/05/2019  ? TIA (transient ischemic attack) 04/05/2019  ? Endometrial cancer ([facility]) 04/22/2017  ? 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 ? ?[name], [place] ?04/03/2021, 3:07 PM ? ?[facility] ?[address] ?[address] ?[place], [place], [address] ?Phone: [phone]   Fax:  [phone] ? ? ?Name: Lisa [name] ?MRN: [id number] ?Date of Birth: 09-24-1945 ? ?

## 2021-04-04 ENCOUNTER — Ambulatory Visit: Payer: Medicare Other | Admitting: Speech Pathology

## 2021-04-05 NOTE — Progress Notes (Signed)
Carelink Summary Report / Loop Recorder 

## 2021-04-09 ENCOUNTER — Other Ambulatory Visit: Payer: Self-pay

## 2021-04-09 ENCOUNTER — Encounter: Payer: Self-pay | Admitting: Speech Pathology

## 2021-04-09 ENCOUNTER — Ambulatory Visit: Payer: Medicare Other | Admitting: Speech Pathology

## 2021-04-09 DIAGNOSIS — R4701 Aphasia: Secondary | ICD-10-CM | POA: Diagnosis not present

## 2021-04-09 NOTE — Therapy (Signed)
[facility] [address] ?[place], [place], [address] ?Phone: [phone]   Fax:  [phone] ? ?Speech Language Pathology Treatment ? ?Patient Details  ?Name: Lisa [name] ?MRN: [id number] ?Date of Birth: 09/26/1945 ?Referring Provider (SLP): Cari Caraway MD ? ? ?Encounter Date: 04/09/2021 ? ? End of Session - 04/09/21 0933   ? ? Visit Number 72   ? Date for SLP Re-Evaluation 04/22/21   this is covering from 02/22/21 to 03/22/21  ? Authorization - Visit Number 4   ? Authorization - Number of Visits 8   ? SLP Start Time [phone]   ? SLP Stop Time  1012   ? SLP Time Calculation (min) 41 min   ? Activity Tolerance Patient tolerated treatment well   ? ?  ?  ? ?  ? ? ?Past Medical History:  ?Diagnosis Date  ? Stroke ([facility]) 04/06/2019  ? Uterine cancer ([facility]) 01/25/2016  ? ? ?Past Surgical History:  ?Procedure Laterality Date  ? BREAST EXCISIONAL BIOPSY Left 1975  ? LOOP RECORDER INSERTION N/A 04/07/2019  ? Procedure: LOOP RECORDER INSERTION;  Surgeon: Evans Lance, MD;  Location: [facility];  Service: Cardiovascular;  Laterality: N/A;  ? TUBAL LIGATION  1970  ? ? ?There were no vitals filed for this visit. ? ? Subjective Assessment - 04/09/21 0933   ? ? Subjective "We practiced the word list."   ? Currently in Pain? No/denies   ? ?  ?  ? ?  ? ? ? ? ? ? ? ? ADULT SLP TREATMENT - 04/09/21 0949   ? ?  ? General Information  ? Behavior/Cognition Alert;Cooperative;Pleasant mood   ?  ? Treatment Provided  ? Treatment provided Cognitive-Linquistic   ?  ? Cognitive-Linquistic Treatment  ? Treatment focused on Aphasia   ? Skilled Treatment Facilitated increased word retrieval using organization task to further develop describing abilities. Given categories, pt was able to look at grocery list and organize ingredients into categories given spv. Pt was able to recall word finding 2 word finding strategies with minA verbal cues.   ?  ? Assessment / Recommendations / Plan  ?  Plan Continue with current plan of care;Other (Comment)   ?  ? Progression Toward Goals  ? Progression toward goals Progressing toward goals   ? ?  ?  ? ?  ? ? ? ? ? SLP Short Term Goals - 03/15/21 1021   ? ?  ? SLP SHORT TERM GOAL #1  ? Title During a structured divergent naming task, pt will provide 10 objects in personally-relevant categories with minA.   ? Time 2   ? Period Weeks   ? Status Not Met   Not met consistently  ? Target Date 03/08/21   ?  ? SLP SHORT TERM GOAL #2  ? Title Pt will select and use 1-2 word finding strategies and use in a structured conversation with modA   ? Time 2   ? Period Weeks   ? Status Achieved   ? Target Date 03/08/21   ?  ? SLP SHORT TERM GOAL #3  ? Title Pt will spell 5+ letter words using ACRT to increase written expression given minA.   Error: STG was supposed to be 2 and LTG was supposed to be 3.  ? Time 2   ? Period Weeks   ? Status Deferred   ? Target Date 03/08/21   ? ?  ?  ? ?  ? ? ?  SLP Long Term Goals - 03/28/21 1401   ? ?  ? SLP LONG TERM GOAL #1  ? Title Pt will describe 2-3 therapeutic tasks she is able to complete at home to continue working towards language goals independently.   ? Time 4   ? Period Weeks   ? Status Not Met   Cont.  ? Target Date 04/22/21   ?  ? SLP LONG TERM GOAL #2  ? Title Pt will use 1-2 word finding strategies  in 20 min conversation given <2 verbal reminders during instances of anomia with SLP.   ? Time 4   ? Period Weeks   ? Status Not Met   Not met, cont  ? Target Date 04/22/21   ? ?  ?  ? ?  ? ? ? Plan - 04/09/21 0934   ? ? Clinical Impression Statement See tx note. Cont with current POC. SLP continues to rec skilled speech services due to expressive language deficits that impact her ability to participate functionally with others. To see additional month and d/c if patient is no longer making progress.   ? Speech Therapy Frequency 2x / week   ? Duration 4 weeks   ? Treatment/Interventions Compensatory strategies;Cueing  hierarchy;Functional tasks;Patient/family education;Environmental controls;Multimodal communcation approach;Language facilitation;Compensatory techniques;Internal/external aids;SLP instruction and feedback   ? Potential to Achieve Goals Good   ? Consulted and Agree with Plan of Care Patient;Family member/caregiver   ? Family Member Consulted Husband   ? ?  ?  ? ?  ? ? ?Patient will benefit from skilled therapeutic intervention in order to improve the following deficits and impairments:   ?Aphasia ? ? ? ?Problem List ?Patient Active Problem List  ? Diagnosis Date Noted  ? Amnesia 08/02/2020  ? Acute CVA (cerebrovascular accident) ([facility]) 04/07/2019  ? CVA (cerebral vascular accident) ([facility]) 04/05/2019  ? TIA (transient ischemic attack) 04/05/2019  ? Endometrial cancer ([facility]) 04/22/2017  ? 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 ? ?[name], CCC-SLP ?04/09/2021, 9:53 AM ? ?[facility] ?[address] ?[place], [place], [address] ?Phone: [phone]   Fax:  [phone] ? ? ?Name: KERRYANN ALLAIRE ?MRN: [id number] ?Date of Birth: 1945-12-27 ? ?

## 2021-04-11 ENCOUNTER — Other Ambulatory Visit: Payer: Self-pay

## 2021-04-11 ENCOUNTER — Ambulatory Visit: Payer: Medicare Other | Admitting: Speech Pathology

## 2021-04-11 ENCOUNTER — Encounter: Payer: Self-pay | Admitting: Speech Pathology

## 2021-04-11 DIAGNOSIS — R4701 Aphasia: Secondary | ICD-10-CM

## 2021-04-11 NOTE — Therapy (Signed)
?OUTPATIENT SPEECH LANGUAGE PATHOLOGY TREATMENT NOTE ? ? ?Patient Name: Lisa [name] ?MRN: [id number] ?DOB:1945-09-28, 76 y.o., female ?Today's Date: 04/11/2021 ? ?PCP: Cari Caraway, MD ?REFERRING PROVIDER: Cari Caraway, MD ? ? End of Session - 04/11/21 1040   ? ? Visit Number 44   ? Date for SLP Re-Evaluation 04/22/21   this is covering from 02/22/21 to 03/22/21  ? Authorization - Visit Number 5   ? Authorization - Number of Visits 8   ? SLP Start Time 0930   ? SLP Stop Time  1010   ? SLP Time Calculation (min) 40 min   ? Activity Tolerance Patient tolerated treatment well   ? ?  ?  ? ?  ? ? ?Past Medical History:  ?Diagnosis Date  ? Stroke ([facility]) 04/06/2019  ? Uterine cancer ([facility]) 01/25/2016  ? ?Past Surgical History:  ?Procedure Laterality Date  ? BREAST EXCISIONAL BIOPSY Left 1975  ? LOOP RECORDER INSERTION N/A 04/07/2019  ? Procedure: LOOP RECORDER INSERTION;  Surgeon: Evans Lance, MD;  Location: [facility];  Service: Cardiovascular;  Laterality: N/A;  ? TUBAL LIGATION  1970  ? ?Patient Active Problem List  ? Diagnosis Date Noted  ? Amnesia 08/02/2020  ? Acute CVA (cerebrovascular accident) ([facility]) 04/07/2019  ? CVA (cerebral vascular accident) ([facility]) 04/05/2019  ? TIA (transient ischemic attack) 04/05/2019  ? Endometrial cancer ([facility]) 04/22/2017  ? 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 ? ?ONSET DATE: 08/02/20 ? ?REFERRING DIAG: R47.01 (ICD-10-CM) - Aphasia ? ?THERAPY DIAG: Aphasia ? ?SUBJECTIVE: "I have been doing good." ? ?PAIN:  ?Are you having pain? No ? ? ? ? ?OBJECTIVE ? ? ?TODAY'S TREATMENT: ? ?04/11/21 ? ?Pt was seen for skilled speech therapy services to address expressive communication goals. SLP facilitated increased word retrieval while completing organization task. Pt required min-to-modA verbal cueing to complete task. She was able to produce additional word to group, but had more difficulty retrieving category for given group. Will continue to address. Provided HEP re:  concrete organization  ? ?PATIENT EDUCATION: ?Education details: see above ?Person educated: Patient ?Education method: Explanation and Demonstration ?Education comprehension: verbalized understanding ? ? ? SLP Short Term Goals   ? ?  ? SLP SHORT TERM GOAL #1  ? Title During a structured divergent naming task, pt will provide 10 objects in personally-relevant categories with minA.   ? Time 2   ? Period Weeks   ? Status Not Met   Not met consistently  ? Target Date 03/08/21   ?  ? SLP SHORT TERM GOAL #2  ? Title Pt will select and use 1-2 word finding strategies and use in a structured conversation with modA   ? Time 2   ? Period Weeks   ? Status Achieved   ? Target Date 03/08/21   ?  ? SLP SHORT TERM GOAL #3  ? Title Pt will spell 5+ letter words using ACRT to increase written expression given minA.   Error: STG was supposed to be 2 and LTG was supposed to be 3.  ? Time 2   ? Period Weeks   ? Status Deferred   ? Target Date 03/08/21   ? ?  ?  ? ?  ? ? ? SLP Long Term Goals  ? ?  ? SLP LONG TERM GOAL #1  ? Title Pt will describe 2-3 therapeutic tasks she is able to complete at home to continue working towards language goals independently.   ?  Time 4   ? Period Weeks   ? Status Not Met   Cont.  ? Target Date 04/22/21   ?  ? SLP LONG TERM GOAL #2  ? Title Pt will use 1-2 word finding strategies  in 20 min conversation given <2 verbal reminders during instances of anomia with SLP.   ? Time 4   ? Period Weeks   ? Status Not Met   Not met, cont  ? Target Date 04/22/21   ? ?  ?  ? ?  ? ? ? Plan  ? ? Clinical Impression Statement See tx note. Continue with current POC.    ? Speech Therapy Frequency 2x / week   ? Duration 4 weeks   ? Treatment/Interventions Compensatory strategies;Cueing hierarchy;Functional tasks;Patient/family education;Environmental controls;Multimodal communcation approach;Language facilitation;Compensatory techniques;Internal/external aids;SLP instruction and feedback   ? Potential to Achieve Goals Good    ? Consulted and Agree with Plan of Care Patient;Family member/caregiver   ? Family Member Consulted Husband   ? ?  ?  ? ?  ? ? ? ? ?[name], CCC-SLP ?04/11/2021, 10:47 AM ? ? ?

## 2021-04-16 ENCOUNTER — Ambulatory Visit: Payer: Medicare Other | Admitting: Speech Pathology

## 2021-04-18 ENCOUNTER — Ambulatory Visit: Payer: Medicare Other | Admitting: Speech Pathology

## 2021-04-22 ENCOUNTER — Ambulatory Visit: Payer: Medicare Other | Attending: Family Medicine | Admitting: Speech Pathology

## 2021-04-22 ENCOUNTER — Encounter: Payer: Self-pay | Admitting: Speech Pathology

## 2021-04-22 DIAGNOSIS — R4701 Aphasia: Secondary | ICD-10-CM | POA: Insufficient documentation

## 2021-04-22 NOTE — Therapy (Signed)
[facility] ?[facility] [address] ?[place], [place], [address] ?Phone: [phone]   Fax:  [phone] ? ?Speech Language Pathology Treatment & Recertification ? ?Patient Details  ?Name: Lisa [name] ?MRN: [id number] ?Date of Birth: [birth year] ?Referring Provider (SLP): Cari Caraway MD ? ? ?Encounter Date: 04/22/2021 ? ? End of Session - 04/22/21 0851   ? ? Visit Number 66   ? Date for SLP Re-Evaluation 05/22/21   this is covering from 02/22/21 to 03/22/21  ? Authorization - Visit Number 0   ? Authorization - Number of Visits 2   ? SLP Start Time [phone]   ? SLP Stop Time  0930   ? SLP Time Calculation (min) 40 min   ? Activity Tolerance Patient tolerated treatment well   ? ?  ?  ? ?  ? ? ?Past Medical History:  ?Diagnosis Date  ? Stroke ([facility]) 04/06/2019  ? Uterine cancer ([facility]) 01/25/2016  ? ? ?Past Surgical History:  ?Procedure Laterality Date  ? BREAST EXCISIONAL BIOPSY Left 1975  ? LOOP RECORDER INSERTION N/A 04/07/2019  ? Procedure: LOOP RECORDER INSERTION;  Surgeon: Evans Lance, MD;  Location: [facility];  Service: Cardiovascular;  Laterality: N/A;  ? TUBAL LIGATION  1970  ? ? ?There were no vitals filed for this visit. ? ? Subjective Assessment - 04/22/21 0853   ? ? Subjective "I feel like we are doing well."   ? Currently in Pain? No/denies   ? ?  ?  ? ?  ? ? ? ? ? ? ? ? ADULT SLP TREATMENT - 04/22/21 1423   ? ?  ? General Information  ? Behavior/Cognition Alert;Cooperative;Pleasant mood   ?  ? Treatment Provided  ? Treatment provided Cognitive-Linquistic   ?  ? Cognitive-Linquistic Treatment  ? Treatment focused on Aphasia   ? Skilled Treatment Reviewed HEP. Pt demonstrated more errors towards end of HEP. SLP edu on the importance of double checking for error. Completed conversation therapy today. SLP asked pt open ended questions and pt had difficulty using strategies without prompting from SLP/husband. Husband has been trained in supported conversation  and does so independently.   ?  ? Assessment / Recommendations / Plan  ? Plan Continue with current plan of care;Other (Comment)   ?  ? Progression Toward Goals  ? Progression toward goals Progressing toward goals   ? ?  ?  ? ?  ? ? ? ? ? SLP Short Term Goals - 03/15/21 1021   ? ?  ? SLP SHORT TERM GOAL #1  ? Title During a structured divergent naming task, pt will provide 10 objects in personally-relevant categories with minA.   ? Time 2   ? Period Weeks   ? Status Not Met   Not met consistently  ? Target Date 03/08/21   ?  ? SLP SHORT TERM GOAL #2  ? Title Pt will select and use 1-2 word finding strategies and use in a structured conversation with modA   ? Time 2   ? Period Weeks   ? Status Achieved   ? Target Date 03/08/21   ?  ? SLP SHORT TERM GOAL #3  ? Title Pt will spell 5+ letter words using ACRT to increase written expression given minA.   Error: STG was supposed to be 2 and LTG was supposed to be 3.  ? Time 2   ? Period Weeks   ? Status Deferred   ? Target  Date 03/08/21   ? ?  ?  ? ?  ? ? ? SLP Long Term Goals - 04/22/21 1426   ? ?  ? SLP LONG TERM GOAL #1  ? Title Pt will describe 2-3 therapeutic tasks she is able to complete at home to continue working towards language goals independently.   ? Period Weeks   ? Status Not Met   Cont.  ? Target Date 05/13/21   ?  ? SLP LONG TERM GOAL #2  ? Title Pt will use 1-2 word finding strategies  in 20 min conversation given <2 verbal reminders during instances of anomia with SLP.   ? Period Weeks   ? Status Not Met   Not met, cont  ? Target Date 05/13/21   ? ?  ?  ? ?  ? ? ? Plan - 04/22/21 0853   ? ? Clinical Impression Statement See tx note. Cont with current POC. SLP continues to rec skilled speech services due to expressive language deficits that impact her ability to participate functionally with others. To recert x2 and d/c. Patient and family in agreement.   ? Speech Therapy Frequency 2x / week   ? Duration 1 week   ? Treatment/Interventions Compensatory  strategies;Cueing hierarchy;Functional tasks;Patient/family education;Environmental controls;Multimodal communcation approach;Language facilitation;Compensatory techniques;Internal/external aids;SLP instruction and feedback   ? Potential to Achieve Goals Good   ? Consulted and Agree with Plan of Care Patient;Family member/caregiver   ? Family Member Consulted Husband   ? ?  ?  ? ?  ? ? ?Patient will benefit from skilled therapeutic intervention in order to improve the following deficits and impairments:   ?Aphasia ? ? ? ?Problem List ?Patient Active Problem List  ? Diagnosis Date Noted  ? Amnesia 08/02/2020  ? Acute CVA (cerebrovascular accident) ([facility]) 04/07/2019  ? CVA (cerebral vascular accident) ([facility]) 04/05/2019  ? TIA (transient ischemic attack) 04/05/2019  ? Endometrial cancer ([facility]) 04/22/2017  ? 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 ? ?[name], [place] ?04/22/2021, 2:27 PM ? ?[facility] ?[address] ?[place], [place], [address] ?Phone: [phone]   Fax:  [phone] ? ? ?Name: Lisa [name] ?MRN: [id number] ?Date of Birth: 1945/02/23 ? ?

## 2021-04-29 ENCOUNTER — Ambulatory Visit (INDEPENDENT_AMBULATORY_CARE_PROVIDER_SITE_OTHER): Payer: Medicare Other

## 2021-04-29 DIAGNOSIS — I639 Cerebral infarction, unspecified: Secondary | ICD-10-CM

## 2021-04-30 ENCOUNTER — Encounter: Payer: Self-pay | Admitting: Speech Pathology

## 2021-04-30 ENCOUNTER — Ambulatory Visit: Payer: Medicare Other | Admitting: Speech Pathology

## 2021-04-30 DIAGNOSIS — R4701 Aphasia: Secondary | ICD-10-CM | POA: Diagnosis not present

## 2021-05-01 LAB — CUP PACEART REMOTE DEVICE CHECK
Date Time Interrogation Session: 20230405231014
Implantable Pulse Generator Implant Date: 20210318

## 2021-05-01 NOTE — Therapy (Signed)
[facility] ?[facility] [place] [address] ?[place], [place], [address] ?Phone: [phone]   Fax:  [phone] ? ?Speech Language Pathology Treatment ? ?Patient Details  ?Name: Lisa [name] ?MRN: [id number] ?Date of Birth: 1945-05-09 ?Referring Provider (SLP): Cari Caraway MD ? ? ?Encounter Date: 04/30/2021 ? ? End of Session - 04/30/21 1017   ? ? Visit Number 27   ? Date for SLP Re-Evaluation 05/22/21   this is covering from 02/22/21 to 03/22/21  ? Authorization - Visit Number 1   ? Authorization - Number of Visits 2   ? SLP Start Time 1015   ? SLP Stop Time  1055   ? SLP Time Calculation (min) 40 min   ? Activity Tolerance Patient tolerated treatment well   ? ?  ?  ? ?  ? ? ?Past Medical History:  ?Diagnosis Date  ? Stroke ([facility]) 04/06/2019  ? Uterine cancer ([facility]) 01/25/2016  ? ? ?Past Surgical History:  ?Procedure Laterality Date  ? BREAST EXCISIONAL BIOPSY Left 1975  ? LOOP RECORDER INSERTION N/A 04/07/2019  ? Procedure: LOOP RECORDER INSERTION;  Surgeon: Evans Lance, MD;  Location: [facility];  Service: Cardiovascular;  Laterality: N/A;  ? TUBAL LIGATION  1970  ? ? ?There were no vitals filed for this visit. ? ? Subjective Assessment - 05/01/21 1522   ? ? Subjective "We had a good Easter."   ? Currently in Pain? No/denies   ? ?  ?  ? ?  ? ? ? ? ? ? ? ? ADULT SLP TREATMENT - 05/01/21 1523   ? ?  ? General Information  ? Behavior/Cognition Alert;Cooperative;Pleasant mood   ?  ? Treatment Provided  ? Treatment provided Cognitive-Linquistic   ?  ? Cognitive-Linquistic Treatment  ? Treatment focused on Patient/family/caregiver education   ? Skilled Treatment Reviewed HEP packet SLP is providing to pt this date. Completed each page with minA verbal cues. To d/c next session.   ?  ? Assessment / Recommendations / Plan  ? Plan Continue with current plan of care;Other (Comment)   ?  ? Progression Toward Goals  ? Progression toward goals Progressing toward goals   ? ?  ?   ? ?  ? ? ? ? ? SLP Short Term Goals - 03/15/21 1021   ? ?  ? SLP SHORT TERM GOAL #1  ? Title During a structured divergent naming task, pt will provide 10 objects in personally-relevant categories with minA.   ? Time 2   ? Period Weeks   ? Status Not Met   Not met consistently  ? Target Date 03/08/21   ?  ? SLP SHORT TERM GOAL #2  ? Title Pt will select and use 1-2 word finding strategies and use in a structured conversation with modA   ? Time 2   ? Period Weeks   ? Status Achieved   ? Target Date 03/08/21   ?  ? SLP SHORT TERM GOAL #3  ? Title Pt will spell 5+ letter words using ACRT to increase written expression given minA.   Error: STG was supposed to be 2 and LTG was supposed to be 3.  ? Time 2   ? Period Weeks   ? Status Deferred   ? Target Date 03/08/21   ? ?  ?  ? ?  ? ? ? SLP Long Term Goals - 04/22/21 1426   ? ?  ? SLP LONG TERM GOAL #1  ?  Title Pt will describe 2-3 therapeutic tasks she is able to complete at home to continue working towards language goals independently.   ? Period Weeks   ? Status Not Met   Cont.  ? Target Date 05/13/21   ?  ? SLP LONG TERM GOAL #2  ? Title Pt will use 1-2 word finding strategies  in 20 min conversation given <2 verbal reminders during instances of anomia with SLP.   ? Period Weeks   ? Status Not Met   Not met, cont  ? Target Date 05/13/21   ? ?  ?  ? ?  ? ? ? Plan - 05/01/21 1523   ? ? Clinical Impression Statement See tx note. Cont with current POC. SLP continues to rec skilled speech services due to expressive language deficits that impact her ability to participate functionally with others. To d/c next session.   ? Speech Therapy Frequency 2x / week   ? Duration 1 week   ? Treatment/Interventions Compensatory strategies;Cueing hierarchy;Functional tasks;Patient/family education;Environmental controls;Multimodal communcation approach;Language facilitation;Compensatory techniques;Internal/external aids;SLP instruction and feedback   ? Potential to Achieve Goals Good    ? Consulted and Agree with Plan of Care Patient;Family member/caregiver   ? Family Member Consulted Husband   ? ?  ?  ? ?  ? ? ?Patient will benefit from skilled therapeutic intervention in order to improve the following deficits and impairments:   ?Aphasia ? ? ? ?Problem List ?Patient Active Problem List  ? Diagnosis Date Noted  ? Amnesia 08/02/2020  ? Acute CVA (cerebrovascular accident) ([facility]) 04/07/2019  ? CVA (cerebral vascular accident) ([facility]) 04/05/2019  ? TIA (transient ischemic attack) 04/05/2019  ? Endometrial cancer ([facility]) 04/22/2017  ? 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 ? ?[name], CCC-SLP ?05/01/2021, 3:24 PM ? ?[facility] ?[address] ?[place], [place], [address] ?Phone: [phone]   Fax:  [phone] ? ? ?Name: Lisa [name] ?MRN: [id number] ?Date of Birth: 08/22/1945 ? ?

## 2021-05-02 ENCOUNTER — Ambulatory Visit: Payer: Medicare Other | Admitting: Speech Pathology

## 2021-05-02 DIAGNOSIS — R4701 Aphasia: Secondary | ICD-10-CM | POA: Diagnosis not present

## 2021-05-03 ENCOUNTER — Encounter: Payer: Self-pay | Admitting: Speech Pathology

## 2021-05-03 NOTE — Therapy (Signed)
[facility] ?[place], [place], [address] ?Phone: [phone]   Fax:  [phone] ? ?Speech Language Pathology Treatment ? ?Patient Details  ?Name: Lisa [name] ?MRN: [id number] ?Date of Birth: 1945/03/13 ?Referring Provider (SLP): Cari Caraway MD ? ? ?Encounter Date: 05/02/2021 ? ?SPEECH THERAPY DISCHARGE SUMMARY ? ?Visits from Start of Care: 47 ? ?Current functional level related to goals / functional outcomes: ?All goals met.  ?  ?Remaining deficits: ?Aphasia   ?  ?Education / Equipment: ?Completed `  ? ?Patient agrees to discharge. Patient goals were met. Patient is being discharged due to being pleased with the current functional level.. ? ? ? ? ? End of Session - 05/03/21 0956   ? ? Visit Number 4   ? Date for SLP Re-Evaluation 05/22/21   this is covering from 02/22/21 to 03/22/21  ? Authorization - Visit Number 2   ? Authorization - Number of Visits 2   ? SLP Start Time 1015   ? SLP Stop Time  1049   ? SLP Time Calculation (min) 34 min   ? Activity Tolerance Patient tolerated treatment well   ? ?  ?  ? ?  ? ? ?Past Medical History:  ?Diagnosis Date  ? Stroke ([place]) 04/06/2019  ? Uterine cancer ([place] [facility]) 01/25/2016  ? ? ?Past Surgical History:  ?Procedure Laterality Date  ? BREAST EXCISIONAL BIOPSY Left 1975  ? LOOP RECORDER INSERTION N/A 04/07/2019  ? Procedure: LOOP RECORDER INSERTION;  Surgeon: Evans Lance, MD;  Location: [facility];  Service: Cardiovascular;  Laterality: N/A;  ? TUBAL LIGATION  1970  ? ? ?There were no vitals filed for this visit. ? ? Subjective Assessment - 05/03/21 0957   ? ? Subjective "Everything is good."   ? Currently in Pain? No/denies   ? ?  ?  ? ?  ? ? ? ? ? ? ? ? ADULT SLP TREATMENT - 05/03/21 0955   ? ?  ? General Information  ? Behavior/Cognition Alert;Cooperative;Pleasant mood   ?  ? Treatment Provided  ? Treatment provided Cognitive-Linquistic   ?  ? Cognitive-Linquistic Treatment  ? Treatment focused on  Patient/family/caregiver education   ? Skilled Treatment Reviewed HEP packet SLP is providing to pt this date. Completed each page with minA verbal cues. Pt/husband have no other questions.They feel comfortable with discharge.   ?  ? Assessment / Recommendations / Plan  ? Plan All goals met;Discharge SLP treatment due to (comment)   ?  ? Progression Toward Goals  ? Progression toward goals Goals met, education completed, patient discharged from SLP   ? ?  ?  ? ?  ? ? ? ? ? SLP Short Term Goals - 03/15/21 1021   ? ?  ? SLP SHORT TERM GOAL #1  ? Title During a structured divergent naming task, pt will provide 10 objects in personally-relevant categories with minA.   ? Time 2   ? Period Weeks   ? Status Not Met   Not met consistently  ? Target Date 03/08/21   ?  ? SLP SHORT TERM GOAL #2  ? Title Pt will select and use 1-2 word finding strategies and use in a structured conversation with modA   ? Time 2   ? Period Weeks   ? Status Achieved   ? Target Date 03/08/21   ?  ? SLP SHORT TERM GOAL #3  ? Title Pt will spell 5+ letter words  using ACRT to increase written expression given minA.   Error: STG was supposed to be 2 and LTG was supposed to be 3.  ? Time 2   ? Period Weeks   ? Status Deferred   ? Target Date 03/08/21   ? ?  ?  ? ?  ? ? ? SLP Long Term Goals - 04/22/21 1426   ? ?  ? SLP LONG TERM GOAL #1  ? Title Pt will describe 2-3 therapeutic tasks she is able to complete at home to continue working towards language goals independently.   ? Period Weeks   ? Status Not Met   Cont.  ? Target Date 05/13/21   ?  ? SLP LONG TERM GOAL #2  ? Title Pt will use 1-2 word finding strategies  in 20 min conversation given <2 verbal reminders during instances of anomia with SLP.   ? Period Weeks   ? Status Not Met   Not met, cont  ? Target Date 05/13/21   ? ?  ?  ? ?  ? ? ? Plan - 05/03/21 0957   ? ? Clinical Impression Statement See tx note. Patient to d/c this session. Pt in agreement. All goals met. Pt demonstrates  understanding of continued work towards language goals at home.   ? Speech Therapy Frequency 2x / week   ? Duration 1 week   ? Treatment/Interventions Compensatory strategies;Cueing hierarchy;Functional tasks;Patient/family education;Environmental controls;Multimodal communcation approach;Language facilitation;Compensatory techniques;Internal/external aids;SLP instruction and feedback   ? Potential to Achieve Goals Good   ? Consulted and Agree with Plan of Care Patient;Family member/caregiver   ? Family Member Consulted Husband   ? ?  ?  ? ?  ? ? ?Patient will benefit from skilled therapeutic intervention in order to improve the following deficits and impairments:   ?Aphasia ? ? ? ?Problem List ?Patient Active Problem List  ? Diagnosis Date Noted  ? Amnesia 08/02/2020  ? Acute CVA (cerebrovascular accident) ([facility]) 04/07/2019  ? CVA (cerebral vascular accident) ([facility]) 04/05/2019  ? TIA (transient ischemic attack) 04/05/2019  ? Endometrial cancer ([facility]) 04/22/2017  ? 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 ? ?[name], CCC-SLP ?05/03/2021, 9:59 AM ? ?[facility] ?[address] ?[place], [place], [address] ?Phone: [phone]   Fax:  [phone] ? ? ?Name: Lisa [name] ?MRN: [id number] ?Date of Birth: 02-17-1945 ? ?

## 2021-05-16 NOTE — Progress Notes (Signed)
Carelink Summary Report / Loop Recorder 

## 2021-05-22 ENCOUNTER — Encounter: Payer: Self-pay | Admitting: Neurology

## 2021-05-22 ENCOUNTER — Ambulatory Visit (INDEPENDENT_AMBULATORY_CARE_PROVIDER_SITE_OTHER): Payer: Medicare Other | Admitting: Neurology

## 2021-05-22 VITALS — BP 158/81 | HR 79 | Ht 61.0 in | Wt 172.4 lb

## 2021-05-22 DIAGNOSIS — L409 Psoriasis, unspecified: Secondary | ICD-10-CM | POA: Insufficient documentation

## 2021-05-22 DIAGNOSIS — I639 Cerebral infarction, unspecified: Secondary | ICD-10-CM

## 2021-05-22 DIAGNOSIS — E7800 Pure hypercholesterolemia, unspecified: Secondary | ICD-10-CM | POA: Insufficient documentation

## 2021-05-22 DIAGNOSIS — R4701 Aphasia: Secondary | ICD-10-CM | POA: Insufficient documentation

## 2021-05-22 DIAGNOSIS — I6990 Unspecified sequelae of unspecified cerebrovascular disease: Secondary | ICD-10-CM | POA: Diagnosis not present

## 2021-05-22 DIAGNOSIS — M2041 Other hammer toe(s) (acquired), right foot: Secondary | ICD-10-CM | POA: Insufficient documentation

## 2021-05-22 DIAGNOSIS — M85852 Other specified disorders of bone density and structure, left thigh: Secondary | ICD-10-CM | POA: Insufficient documentation

## 2021-05-22 DIAGNOSIS — E785 Hyperlipidemia, unspecified: Secondary | ICD-10-CM | POA: Insufficient documentation

## 2021-05-22 DIAGNOSIS — I10 Essential (primary) hypertension: Secondary | ICD-10-CM | POA: Insufficient documentation

## 2021-05-22 DIAGNOSIS — L509 Urticaria, unspecified: Secondary | ICD-10-CM | POA: Insufficient documentation

## 2021-05-22 DIAGNOSIS — Z8542 Personal history of malignant neoplasm of other parts of uterus: Secondary | ICD-10-CM | POA: Insufficient documentation

## 2021-05-22 DIAGNOSIS — R12 Heartburn: Secondary | ICD-10-CM | POA: Insufficient documentation

## 2021-05-22 NOTE — Patient Instructions (Signed)
I had a long d/w patient about her prior  strokes, risk for recurrent stroke/TIAs, personally independently reviewed imaging studies and stroke evaluation results and answered questions.Continue Plavix 75 mg daily alone and discontinue aspirin now for secondary stroke prevention and maintain strict control of hypertension with blood pressure goal below 130/90, diabetes with hemoglobin A1c goal below 6.5% and lipids with LDL cholesterol goal below 70 mg/dL. I also advised the patient to eat a healthy diet with plenty of whole grains, cereals, fruits and vegetables, exercise regularly and maintain ideal body weight.  Check follow-up carotid ultrasound study.  Followup in the future with me in 1 year or call earlier if necessary. ? ?

## 2021-05-22 NOTE — Progress Notes (Signed)
?[facility] ?[address] ?[place]. [place] [address] ?[phone] ? ?     STROKE FOLLOW UP NOTE ? ?Lisa [name] ?Date of Birth:  May 19, 1945 ?Medical Record Number:  [id number]  ? ?Reason for Referral: stroke follow up ? ? ? ?SUBJECTIVE:   ? ?Update 11/22/2020: Patient is seen for follow-up today following recent admission for recurrent stroke in July 2022.  She is accompanied by her husband.  History is obtained from them and review of electronic medical records and I have personally reviewed available pertinent imaging films in PACS.  She presented on 08/02/2020 with waking up with difficulty speaking and communicating.  She is sat in the car and did not know how to start the car and ultimately the husband drove her to the [facility] ED for evaluation.  The episode lasted about an hour and a half and then she gradually got better.  She was seen by neuro hospitalist and was found to have mild word finding difficulties.  MRI scan of the brain showed a left ACA infarct with mild cytotoxic edema and multiple old infarcts and chronic changes of small vessel disease.  2D echo showed ejection fraction of 60% no cardiac source of embolism.  LDL cholesterol 74 mg percent.  Hemoglobin A1c was 5.8.  Loop recorder interrogation did not show any paroxysmal A. fib.  She underwent CT scan of the chest abdomen pelvis and no evidence of malignancy was found.  She is continue on dual antiplatelet therapy and advised to change to Plavix after 3 weeks but currently it appears she is taking aspirin 81 mg instead.  She is done well.  She has very minimal word finding difficulties and occasional hesitancy but is almost back to baseline.  She has had no recurrent stroke or TIA symptoms.  She is tolerating Lipitor well without muscle aches and pains.  Her blood pressure is well controlled.  She has no new complaints today.  She has prior history of bilateral MCA infarcts and March 2021 with extensive  work-up for source of embolism being negative and had loop recorder inserted but so for A. fib has not been found. ?  ? ? ?Prior visit 09/27/2019, Lisa [name] returns for stroke follow-up ? ?Reports she has been stable since prior visit without residual stroke deficits and denies new or reoccurring stroke/TIA symptoms ? ?Remains on aspirin 81 mg daily without bleeding or bruising ?Remains on atorvastatin 40 mg but reports only taking 1-2 times weekly due to concern of possible statin side effects but has not actually experienced side effects personally ?Lipid panel obtained after prior visit on 05/10/2019 which showed LDL 59 ?Blood pressure today 151/76 -monitors at home and typically stable ? ?Loop recorder has not shown atrial fibrillation thus far ? ?No concerns at this time ? ? ? ?History provided for reference purposes only ?Update 05/10/2019 JM: Lisa [name] is being seen for hospital follow-up accompanied by her husband.  She has been stable from a stroke standpoint without residual deficits or new/reoccurring stroke/TIA symptoms.  Completed 3 weeks DAPT and continues on aspirin alone without bleeding or bruising.  Continues on atorvastatin 40 mg daily without myalgias.  Blood pressure today 130/86.  Loop recorder has not shown atrial fibrillation thus far.  No concerns at this time. ? ?Stroke admission 04/05/2019 ?Lisa [name] is a 76 y.o. female with history of uterine cancer  who presented on 04/05/2019 with transient dizziness, L>R leg weakness and B arm weakness  while walking in [employer].  Stroke work-up revealed 3-4 punctate bilateral MCA/ACA watershed infarcts embolic secondary to unknown source.  Loop recorder placed to rule out atrial fibrillation as potential etiology.  Recommended DAPT for 3 weeks and aspirin alone as previously not on antithrombotic.  No prior history of HTN.  LDL 139 initiate atorvastatin 40 mg daily.  No evidence or history of DM with A1c 5.7.  Other stroke risk factors  include advanced age, EtOH use, family history of stroke and prior strokes on imaging.  She was discharged home in stable condition without therapy needs. ? ?Stroke: 3-4 punctate bilateral MCA/ACA watershed infarcts embolic secondary to unknown source ?Code Stroke CT head No acute abnormality. ASPECTS 10.    ?CTA head & neck no LVO. Mild R M1/MCA stenosis. Small distal supraclinoid R ICA aneurysm.  ?MRI  3-4 punctate B MCA/ACA watershed infarcts. Multiple old infarct. Chronic ischemic microangiopathy. ?LE Doppler  No DVT ?2D Echo EF 65-70% ?Loop recorder placed ?LDL 139 -initiated atorvastatin 40 mg daily ?HgbA1c 5.7 ?No HTN hx ?Lovenox 40 mg sq daily for VTE prophylaxis ?No antithrombotic prior to admission, now on aspirin 81 mg daily and clopidogrel 75 mg daily. Continue DAPT x 3 weeks then aspirin alone.   ?Therapy recommendations:   No therapy needs ?Disposition:   Home ? ? ?Update 05/22/2021 ; she returns for follow-up after last visit 6 months ago.  She is accompanied by her husband.  She states she is doing well except for occasional speech difficulties and some balance issues but she has had no falls or injuries.  She has had no recurrent stroke or TIA symptoms.  She remains on aspirin and Plavix which is tolerating well with minor bruising and no bleeding.  Blood pressure is quite well controlled at home though today it is elevated in office at 158/81.  She remains on Lipitor which is tolerating well without side effects.  She states her primary care physician did check lipid few months ago and it was satisfactory.  She has no new complaints today.Loop recorder interrogation has not yet shown any PAF ? ? ?ROS:   ?14 system review of systems performed and negative with exception of occasional speech difficulties and poor bal;ance ? ?PMH:  ?Past Medical History:  ?Diagnosis Date  ? Stroke ([facility]) 04/06/2019  ? Uterine cancer ([facility]) 01/25/2016  ? ? ?PSH:  ?Past Surgical History:  ?Procedure Laterality Date  ?  BREAST EXCISIONAL BIOPSY Left 1975  ? LOOP RECORDER INSERTION N/A 04/07/2019  ? Procedure: LOOP RECORDER INSERTION;  Surgeon: Evans Lance, MD;  Location: [facility];  Service: Cardiovascular;  Laterality: N/A;  ? TUBAL LIGATION  1970  ? ? ?Social History:  ?Social History  ? ?Socioeconomic History  ? Marital status: Married  ?  Spouse name: Not on file  ? Number of children: Not on file  ? Years of education: Not on file  ? Highest education level: Not on file  ?Occupational History  ? Not on file  ?Tobacco Use  ? Smoking status: Never  ? Smokeless tobacco: Never  ?Vaping Use  ? Vaping Use: Never used  ?Substance and Sexual Activity  ? Alcohol use: Yes  ?  Comment: occasional drinker  ? Drug use: Never  ? Sexual activity: Not on file  ?Other Topics Concern  ? Not on file  ?Social History Narrative  ? Not on file  ? ?Social Determinants of Health  ? ?Financial Resource Strain: Not on file  ?Food  Insecurity: Not on file  ?Transportation Needs: Not on file  ?Physical Activity: Not on file  ?Stress: Not on file  ?Social Connections: Not on file  ?Intimate Partner Violence: Not on file  ? ? ?Family History:  ?Family History  ?Problem Relation Age of Onset  ? Hypertension Mother   ? Stroke Mother   ? Hypertension Father   ? ? ?Medications:   ?Current Outpatient Medications on File Prior to Visit  ?Medication Sig Dispense Refill  ? atorvastatin (LIPITOR) 40 MG tablet Take 1 tablet (40 mg total) by mouth daily at 6 PM. 90 tablet 1  ? Cholecalciferol (VITAMIN D3 PO) Take 25 mg by mouth daily.    ? clopidogrel (PLAVIX) 75 MG tablet Take 75 mg by mouth daily.    ? fexofenadine (ALLEGRA) 180 MG tablet Take 180 mg by mouth daily.    ? Magnesium Oxide 400 MG CAPS Take 1 capsule by mouth daily.    ? Multiple Vitamins-Minerals (CENTRUM SILVER 50+WOMEN PO) Take 1 tablet by mouth daily.    ? SUPER B COMPLEX/C PO Take 1 tablet by mouth daily.    ? vitamin B-12 (CYANOCOBALAMIN) 100 MCG tablet Take 100 mcg by mouth daily.     ? ?No current facility-administered medications on file prior to visit.  ? ? ?Allergies:   ?Allergies  ?Allergen Reactions  ? Codeine Nausea And Vomiting  ? Tylenol [Acetaminophen] Hives  ? ? ? ? ?OBJECTI

## 2021-05-27 ENCOUNTER — Telehealth: Payer: Self-pay | Admitting: Neurology

## 2021-05-27 NOTE — Telephone Encounter (Signed)
Medicare/aarp no Lisa [name], sent Lisa [name] a message she will reach out to the patient to schedule.  ?

## 2021-06-03 ENCOUNTER — Ambulatory Visit (HOSPITAL_COMMUNITY): Payer: Medicare Other

## 2021-06-03 ENCOUNTER — Ambulatory Visit (INDEPENDENT_AMBULATORY_CARE_PROVIDER_SITE_OTHER): Payer: Medicare Other

## 2021-06-03 ENCOUNTER — Encounter (HOSPITAL_COMMUNITY): Payer: Self-pay

## 2021-06-03 DIAGNOSIS — I639 Cerebral infarction, unspecified: Secondary | ICD-10-CM | POA: Diagnosis not present

## 2021-06-05 LAB — CUP PACEART REMOTE DEVICE CHECK
Date Time Interrogation Session: 20230510231236
Implantable Pulse Generator Implant Date: 20210318

## 2021-06-24 NOTE — Progress Notes (Signed)
Carelink Summary Report / Loop Recorder 

## 2021-07-08 ENCOUNTER — Ambulatory Visit (INDEPENDENT_AMBULATORY_CARE_PROVIDER_SITE_OTHER): Payer: Medicare Other

## 2021-07-08 DIAGNOSIS — I639 Cerebral infarction, unspecified: Secondary | ICD-10-CM

## 2021-07-10 LAB — CUP PACEART REMOTE DEVICE CHECK
Date Time Interrogation Session: 20230612231003
Implantable Pulse Generator Implant Date: 20210318

## 2021-07-31 NOTE — Progress Notes (Signed)
Carelink Summary Report / Loop Recorder 

## 2021-08-12 ENCOUNTER — Ambulatory Visit (INDEPENDENT_AMBULATORY_CARE_PROVIDER_SITE_OTHER): Payer: Medicare Other

## 2021-08-12 DIAGNOSIS — I639 Cerebral infarction, unspecified: Secondary | ICD-10-CM | POA: Diagnosis not present

## 2021-08-13 LAB — CUP PACEART REMOTE DEVICE CHECK
Date Time Interrogation Session: 20230715230807
Implantable Pulse Generator Implant Date: 20210318

## 2021-09-16 ENCOUNTER — Ambulatory Visit (INDEPENDENT_AMBULATORY_CARE_PROVIDER_SITE_OTHER): Payer: Medicare Other

## 2021-09-16 DIAGNOSIS — I639 Cerebral infarction, unspecified: Secondary | ICD-10-CM | POA: Diagnosis not present

## 2021-09-16 LAB — CUP PACEART REMOTE DEVICE CHECK
Date Time Interrogation Session: 20230817231003
Implantable Pulse Generator Implant Date: 20210318

## 2021-09-20 NOTE — Progress Notes (Signed)
Carelink Summary Report / Loop Recorder 

## 2021-10-10 NOTE — Progress Notes (Signed)
Carelink Summary Report / Loop Recorder 

## 2021-10-21 ENCOUNTER — Ambulatory Visit (INDEPENDENT_AMBULATORY_CARE_PROVIDER_SITE_OTHER): Payer: Medicare Other

## 2021-10-21 DIAGNOSIS — I639 Cerebral infarction, unspecified: Secondary | ICD-10-CM

## 2021-10-22 LAB — CUP PACEART REMOTE DEVICE CHECK
Date Time Interrogation Session: 20231001231614
Implantable Pulse Generator Implant Date: 20210318

## 2021-11-06 ENCOUNTER — Telehealth: Payer: Self-pay | Admitting: *Deleted

## 2021-11-06 NOTE — Telephone Encounter (Signed)
Called and left the patient a message to call the office back. Need to move her appt from 11/8 to either later in the day or to 10/25

## 2021-11-06 NOTE — Progress Notes (Signed)
Carelink Summary Report / Loop Recorder 

## 2021-11-08 NOTE — Telephone Encounter (Signed)
Patient's husband Rinard called in to verify when patient's appointment was.  Mrs Bronaugh was in the background and was asking who the appointment was with.  I explained Dr Delsa Sale on 11/13/21.Marland Kitchen Patient confirmed and understood.

## 2021-11-13 ENCOUNTER — Encounter: Payer: Self-pay | Admitting: Obstetrics & Gynecology

## 2021-11-13 ENCOUNTER — Other Ambulatory Visit: Payer: Self-pay

## 2021-11-13 ENCOUNTER — Inpatient Hospital Stay: Payer: Medicare Other | Attending: Obstetrics & Gynecology | Admitting: Obstetrics & Gynecology

## 2021-11-13 VITALS — BP 148/77 | HR 103 | Temp 98.0°F | Resp 18 | Ht 61.0 in | Wt 165.0 lb

## 2021-11-13 DIAGNOSIS — Z90722 Acquired absence of ovaries, bilateral: Secondary | ICD-10-CM | POA: Diagnosis not present

## 2021-11-13 DIAGNOSIS — C541 Malignant neoplasm of endometrium: Secondary | ICD-10-CM

## 2021-11-13 DIAGNOSIS — Z8542 Personal history of malignant neoplasm of other parts of uterus: Secondary | ICD-10-CM | POA: Diagnosis not present

## 2021-11-13 DIAGNOSIS — Z90710 Acquired absence of both cervix and uterus: Secondary | ICD-10-CM | POA: Insufficient documentation

## 2021-11-13 NOTE — Assessment & Plan Note (Signed)
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  No evidence of recurrence.  > Continue annual visits 

## 2021-11-13 NOTE — Patient Instructions (Signed)
Return in 1 year ?

## 2021-11-13 NOTE — Progress Notes (Signed)
Follow Up Note: Gyn-Onc  Lisa [name] 76 y.o. 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  There is not a comment in the pathology report of lymphovascular space invasion.  11 lymph nodes were sampled including 4 from the left pelvic 7 from the right pelvic 1 from the left aortic and 2 from the right aortic.  All were negative for apparent metastases.  The ovaries tubes and cervix were free of disease   03/10/2016 Genetic Testing    intact nuclear expression of mL H1 [place] 2 and [place] 6 and PMS 2 reflecting no loss of expression of mismatch repair proteins and a microsatellite stable tumor.   04/22/2017 Initial Diagnosis   Endometrial cancer ([facility])      Interval History: She denies any vaginal bleeding, abdominal/pelvic pain, cough, lethargy or abdominal distention.     Review of Systems  Review of Systems  Constitutional:  Negative for malaise/fatigue and weight loss.  Respiratory:  Negative for shortness of breath and wheezing.   Cardiovascular:  Negative for chest pain and leg swelling.  Gastrointestinal:  Negative for abdominal pain, blood in stool, constipation, nausea and vomiting.  Genitourinary:  Negative for dysuria, frequency, hematuria and urgency.  Musculoskeletal:  Negative for joint pain and myalgias.  Neurological:  Negative for weakness.  Psychiatric/Behavioral:  Negative for depression. The patient does not have insomnia.      Current Meds:  Outpatient Encounter Medications as of 11/13/2021  Medication Sig    atorvastatin (LIPITOR) 40 MG tablet Take 1 tablet (40 mg total) by mouth daily at 6 PM.   betamethasone dipropionate 0.05 % cream Apply 1 Application topically daily.   Cholecalciferol (VITAMIN D3 PO) Take 25 mg by mouth daily.   clopidogrel (PLAVIX) 75 MG tablet Take 75 mg by mouth daily.   fexofenadine (ALLEGRA) 180 MG tablet Take 180 mg by mouth daily.   Magnesium Oxide 400 MG CAPS Take 1 capsule by mouth daily.   Multiple Vitamins-Minerals (CENTRUM SILVER 50+WOMEN PO) Take 1 tablet by mouth daily.   SUPER B COMPLEX/C PO Take 1 tablet by mouth daily.   vitamin B-12 (CYANOCOBALAMIN) 100 MCG tablet Take 100 mcg by mouth daily.   No facility-administered encounter medications on file as of 11/13/2021.    Allergy:  Allergies  Allergen Reactions   Codeine Nausea And Vomiting   Tylenol [Acetaminophen] Hives    Social Hx:   Social History   Socioeconomic History   Marital status: Married    Spouse name: Not on file   Number of children: Not on file   Years of education: Not on file   Highest education level: Not on file  Occupational History   Not on file  Tobacco Use   Smoking status: Never   Smokeless tobacco: Never  Vaping Use   Vaping Use: Never used  Substance and Sexual Activity   Alcohol use: Yes    Comment: occasional drinker   Drug use: Never   Sexual activity: Not on file  Other Topics Concern   Not on file  Social History Narrative   Not on file   Social Determinants of Health   Financial Resource  Strain: Not on file  Food Insecurity: Not on file  Transportation Needs: Not on file  Physical Activity: Not on file  Stress: Not on file  Social Connections: Not on file  Intimate Partner Violence: Not on file    Past Surgical Hx:  Past Surgical History:  Procedure Laterality Date   BREAST EXCISIONAL BIOPSY Left [place] N/A 04/07/2019   Procedure: [place];  Surgeon: Evans Lance, MD;  Location: [facility];   Service: Cardiovascular;  Laterality: N/A;   TUBAL LIGATION  1970    Past Medical Hx:  Past Medical History:  Diagnosis Date   Stroke ([facility]) 04/06/2019   Uterine cancer ([facility]) 01/25/2016    Family Hx:  Family History  Problem Relation Age of Onset   Hypertension Mother    Stroke Mother    Hypertension Father     Vitals:  BP (!) 148/77 (BP Location: Left Arm, Patient Position: Sitting) Comment: Dr. Delsa Sale aware  Pulse (!) 103   Temp 98 F (36.7 C)   Resp 18   Ht 5' 1"  (1.549 m)   Wt 165 lb (74.8 kg)   SpO2 100%   BMI 31.18 kg/m    Physical Exam: Physical Exam Exam conducted with a chaperone present.  Constitutional:      General: She is not in acute distress. Cardiovascular:     Rate and Rhythm: Normal rate and regular rhythm.  Pulmonary:     Effort: Pulmonary effort is normal.     Breath sounds: Normal breath sounds. No wheezing or rhonchi.  Abdominal:     Palpations: Abdomen is soft.     Tenderness: There is no abdominal tenderness. There is no right CVA tenderness or left CVA tenderness.     Hernia: No hernia is present.  Genitourinary:    Urethra: No urethral lesion.     Vagina: No bleeding or lesions.     Comments: Mild erythematous, waxy plaque in hour glass configuration.  Buried clitoris.  Resorbed labia minora. Musculoskeletal:     Cervical back: Neck supple.     Right lower leg: No edema.     Left lower leg: No edema.  Lymphadenopathy:     Upper Body:     Right upper body: No supraclavicular adenopathy.     Left upper body: No supraclavicular adenopathy.     Lower Body: No right inguinal adenopathy. No left inguinal adenopathy.  Skin:    Findings: No rash.  Neurological:     Mental Status: She is oriented to person, place, and time.      Assessment/Plan: Endometrial cancer ([facility]) H/O stage IB Gr2 Ec s/p staging in 2/18, high/intermediate risk features (declined adjuvant therapy) MSI stable Negative symptom review, normal exam.  No  evidence of recurrence.  > Continue annual visits   I personally spent 25 minutes face-to-face and non-face-to-face in the care of this patient, which includes all pre, intra, and post visit time on the date of service.    Lahoma Crocker, MD 11/13/2021, 10:50 AM

## 2021-11-25 ENCOUNTER — Ambulatory Visit (INDEPENDENT_AMBULATORY_CARE_PROVIDER_SITE_OTHER): Payer: Medicare Other

## 2021-11-25 DIAGNOSIS — I639 Cerebral infarction, unspecified: Secondary | ICD-10-CM

## 2021-11-27 ENCOUNTER — Ambulatory Visit: Payer: Medicare Other | Admitting: Obstetrics & Gynecology

## 2021-11-27 LAB — CUP PACEART REMOTE DEVICE CHECK
Date Time Interrogation Session: 20231103230738
Implantable Pulse Generator Implant Date: 20210318

## 2021-12-30 ENCOUNTER — Ambulatory Visit (INDEPENDENT_AMBULATORY_CARE_PROVIDER_SITE_OTHER): Payer: Medicare Other

## 2021-12-30 DIAGNOSIS — I639 Cerebral infarction, unspecified: Secondary | ICD-10-CM

## 2021-12-31 LAB — CUP PACEART REMOTE DEVICE CHECK
Date Time Interrogation Session: 20231210232240
Implantable Pulse Generator Implant Date: 20210318

## 2022-01-01 NOTE — Progress Notes (Signed)
Carelink Summary Report / Loop Recorder 

## 2022-02-03 ENCOUNTER — Ambulatory Visit (INDEPENDENT_AMBULATORY_CARE_PROVIDER_SITE_OTHER): Payer: Medicare Other

## 2022-02-03 DIAGNOSIS — I639 Cerebral infarction, unspecified: Secondary | ICD-10-CM | POA: Diagnosis not present

## 2022-02-05 LAB — CUP PACEART REMOTE DEVICE CHECK
Date Time Interrogation Session: 20240112231450
Implantable Pulse Generator Implant Date: 20210318

## 2022-02-06 NOTE — Progress Notes (Signed)
[facility] Summary Report / Loop Recorder

## 2022-03-09 LAB — CUP PACEART REMOTE DEVICE CHECK
Date Time Interrogation Session: 20240214232056
Implantable Pulse Generator Implant Date: 20210318

## 2022-03-10 ENCOUNTER — Ambulatory Visit (INDEPENDENT_AMBULATORY_CARE_PROVIDER_SITE_OTHER): Payer: Medicare Other

## 2022-03-10 DIAGNOSIS — I639 Cerebral infarction, unspecified: Secondary | ICD-10-CM | POA: Diagnosis not present

## 2022-03-24 NOTE — Progress Notes (Signed)
Carelink Summary Report / Loop Recorder 

## 2022-04-14 ENCOUNTER — Ambulatory Visit (INDEPENDENT_AMBULATORY_CARE_PROVIDER_SITE_OTHER): Payer: Medicare Other

## 2022-04-14 DIAGNOSIS — I639 Cerebral infarction, unspecified: Secondary | ICD-10-CM

## 2022-04-14 LAB — CUP PACEART REMOTE DEVICE CHECK
Date Time Interrogation Session: 20240324232527
Implantable Pulse Generator Implant Date: 20210318

## 2022-04-23 NOTE — Progress Notes (Signed)
Carelink Summary Report / Loop Recorder 

## 2022-05-16 ENCOUNTER — Ambulatory Visit (INDEPENDENT_AMBULATORY_CARE_PROVIDER_SITE_OTHER): Payer: Medicare Other

## 2022-05-16 DIAGNOSIS — I639 Cerebral infarction, unspecified: Secondary | ICD-10-CM

## 2022-05-19 LAB — CUP PACEART REMOTE DEVICE CHECK
Date Time Interrogation Session: 20240426230555
Implantable Pulse Generator Implant Date: 20210318

## 2022-05-27 NOTE — Progress Notes (Signed)
Carelink Summary Report / Loop Recorder 

## 2022-06-04 ENCOUNTER — Ambulatory Visit (INDEPENDENT_AMBULATORY_CARE_PROVIDER_SITE_OTHER): Payer: Medicare Other | Admitting: Neurology

## 2022-06-04 ENCOUNTER — Encounter: Payer: Self-pay | Admitting: Neurology

## 2022-06-04 VITALS — BP 126/74 | HR 80 | Ht 63.0 in | Wt 168.0 lb

## 2022-06-04 DIAGNOSIS — I6990 Unspecified sequelae of unspecified cerebrovascular disease: Secondary | ICD-10-CM | POA: Diagnosis not present

## 2022-06-04 NOTE — Patient Instructions (Signed)
I had a long d/w patient  and her husband about her prior  strokes, risk for recurrent stroke/TIAs, personally independently reviewed imaging studies and stroke evaluation results and answered questions.Continue Plavix 75 mg daily alone and discontinue aspirin now for secondary stroke prevention and maintain strict control of hypertension with blood pressure goal below 130/90, diabetes with hemoglobin A1c goal below 6.5% and lipids with LDL cholesterol goal below 70 mg/dL. I also advised the patient to eat a healthy diet with plenty of whole grains, cereals, fruits and vegetables, exercise regularly and maintain ideal body weight.  Check follow-up carotid ultrasound study.  Followup in the future with me in the future only as necessary or call earlier if necessary.

## 2022-06-04 NOTE — Progress Notes (Signed)
[facility] [address] [place]. [place] [address] [phone]       STROKE FOLLOW UP NOTE  [name] Date of Birth:  12/03/1945 Medical Record Number:  [id number]   Reason for Referral: stroke follow up    SUBJECTIVE:    Update 11/22/2020: Patient is seen for follow-up today following recent admission for recurrent stroke in July 2022.  She is accompanied by her husband.  History is obtained from them and review of electronic medical records and I have personally reviewed available pertinent imaging films in PACS.  She presented on 08/02/2020 with waking up with difficulty speaking and communicating.  She is sat in the car and did not know how to start the car and ultimately the husband drove her to the med [facility] ED for evaluation.  The episode lasted about an hour and a half and then she gradually got better.  She was seen by neuro hospitalist and was found to have mild word finding difficulties.  MRI scan of the brain showed a left ACA infarct with mild cytotoxic edema and multiple old infarcts and chronic changes of small vessel disease.  2D echo showed ejection fraction of 60% no cardiac source of embolism.  LDL cholesterol 74 mg percent.  Hemoglobin A1c was 5.8.  Loop recorder interrogation did not show any paroxysmal A. fib.  She underwent CT scan of the chest abdomen pelvis and no evidence of malignancy was found.  She is continue on dual antiplatelet therapy and advised to change to Plavix after 3 weeks but currently it appears she is taking aspirin 81 mg instead.  She is done well.  She has very minimal word finding difficulties and occasional hesitancy but is almost back to baseline.  She has had no recurrent stroke or TIA symptoms.  She is tolerating Lipitor well without muscle aches and pains.  Her blood pressure is well controlled.  She has no new complaints today.  She has prior history of bilateral MCA infarcts and March 2021 with extensive  work-up for source of embolism being negative and had loop recorder inserted but so for A. fib has not been found.     Prior visit 09/27/2019, Lisa [name] returns for stroke follow-up  Reports she has been stable since prior visit without residual stroke deficits and denies new or reoccurring stroke/TIA symptoms  Remains on aspirin 81 mg daily without bleeding or bruising Remains on atorvastatin 40 mg but reports only taking 1-2 times weekly due to concern of possible statin side effects but has not actually experienced side effects personally Lipid panel obtained after prior visit on 05/10/2019 which showed LDL 59 Blood pressure today 151/76 -monitors at home and typically stable  Loop recorder has not shown atrial fibrillation thus far  No concerns at this time    History provided for reference purposes only Update 05/10/2019 JM: Lisa [name] is being seen for hospital follow-up accompanied by her husband.  She has been stable from a stroke standpoint without residual deficits or new/reoccurring stroke/TIA symptoms.  Completed 3 weeks DAPT and continues on aspirin alone without bleeding or bruising.  Continues on atorvastatin 40 mg daily without myalgias.  Blood pressure today 130/86.  Loop recorder has not shown atrial fibrillation thus far.  No concerns at this time.  Stroke admission 04/05/2019 Lisa [name] is a 77 y.o. female with history of uterine cancer  who presented on 04/05/2019 with transient dizziness, L>R leg weakness and B arm weakness  while walking in [employer].  Stroke work-up revealed 3-4 punctate bilateral MCA/ACA watershed infarcts embolic secondary to unknown source.  Loop recorder placed to rule out atrial fibrillation as potential etiology.  Recommended DAPT for 3 weeks and aspirin alone as previously not on antithrombotic.  No prior history of HTN.  LDL 139 initiate atorvastatin 40 mg daily.  No evidence or history of DM with A1c 5.7.  Other stroke risk factors  include advanced age, EtOH use, family history of stroke and prior strokes on imaging.  She was discharged home in stable condition without therapy needs.  Stroke: 3-4 punctate bilateral MCA/ACA watershed infarcts embolic secondary to unknown source Code Stroke CT head No acute abnormality. ASPECTS 10.    CTA head & neck no LVO. Mild R M1/MCA stenosis. Small distal supraclinoid R ICA aneurysm.  MRI  3-4 punctate B MCA/ACA watershed infarcts. Multiple old infarct. Chronic ischemic microangiopathy. LE Doppler  No DVT 2D Echo EF 65-70% Loop recorder placed LDL 139 -initiated atorvastatin 40 mg daily HgbA1c 5.7 No HTN hx Lovenox 40 mg sq daily for VTE prophylaxis No antithrombotic prior to admission, now on aspirin 81 mg daily and clopidogrel 75 mg daily. Continue DAPT x 3 weeks then aspirin alone.   Therapy recommendations:   No therapy needs Disposition:   Home   Update 05/22/2021 ; she returns for follow-up after last visit 6 months ago.  She is accompanied by her husband.  She states she is doing well except for occasional speech difficulties and some balance issues but she has had no falls or injuries.  She has had no recurrent stroke or TIA symptoms.  She remains on aspirin and Plavix which is tolerating well with minor bruising and no bleeding.  Blood pressure is quite well controlled at home though today it is elevated in office at 158/81.  She remains on Lipitor which is tolerating well without side effects.  She states her primary care physician did check lipid few months ago and it was satisfactory.  She has no new complaints today.Loop recorder interrogation has not yet shown any PAF  06/04/2022 : She returns for follow-up after last visit 1 year ago.  Accompanied by husband.  She is doing well.No recurrent stroke or TIA symptoms.  He has noticed improvement in her speech and balance.  Remains on Plavix which is tolerating well without bruising or bleeding.  She is tolerating Lipitor well  without muscle aches and pains.  She states her last lipid profile was satisfactory.  Had ordered An ultrasound at last visit but she did not keep her appointment.  She has no new health problems.  No new complaints.  ROS:   14 system review of systems performed and negative with exception of occasional speech difficulties and poor bal;ance  PMH:  Past Medical History:  Diagnosis Date   Stroke (HCC) 04/06/2019   Uterine cancer (HCC) 01/25/2016    PSH:  Past Surgical History:  Procedure Laterality Date   BREAST EXCISIONAL BIOPSY Left 1975   LOOP RECORDER INSERTION N/A 04/07/2019   Procedure: LOOP RECORDER INSERTION;  Surgeon: Marinus Maw, MD;  Location: [facility];  Service: Cardiovascular;  Laterality: N/A;   TUBAL LIGATION  1970    Social History:  Social History   Socioeconomic History   Marital status: Married    Spouse name: Not on file   Number of children: Not on file   Years of education: Not on file   Highest education level: Not  on file  Occupational History   Not on file  Tobacco Use   Smoking status: Never   Smokeless tobacco: Never  Vaping Use   Vaping Use: Never used  Substance and Sexual Activity   Alcohol use: Yes    Comment: occasional drinker   Drug use: Never   Sexual activity: Not on file  Other Topics Concern   Not on file  Social History Narrative   Not on file   Social Determinants of Health   Financial Resource Strain: Not on file  Food Insecurity: Not on file  Transportation Needs: Not on file  Physical Activity: Not on file  Stress: Not on file  Social Connections: Not on file  Intimate Partner Violence: Not on file    Family History:  Family History  Problem Relation Age of Onset   Hypertension Mother    Stroke Mother    Hypertension Father     Medications:   Current Outpatient Medications on File Prior to Visit  Medication Sig Dispense Refill   atorvastatin (LIPITOR) 40 MG tablet Take 1 tablet (40 mg total) by  mouth daily at 6 PM. 90 tablet 1   betamethasone dipropionate 0.05 % cream Apply 1 Application topically daily.     Cholecalciferol (VITAMIN D3 PO) Take 25 mg by mouth daily.     clopidogrel (PLAVIX) 75 MG tablet Take 75 mg by mouth daily.     fexofenadine (ALLEGRA) 180 MG tablet Take 180 mg by mouth daily.     Magnesium Oxide 400 MG CAPS Take 1 capsule by mouth daily.     Multiple Vitamins-Minerals (CENTRUM SILVER 50+WOMEN PO) Take 1 tablet by mouth daily.     SUPER B COMPLEX/C PO Take 1 tablet by mouth daily.     vitamin B-12 (CYANOCOBALAMIN) 100 MCG tablet Take 100 mcg by mouth daily.     No current facility-administered medications on file prior to visit.    Allergies:   Allergies  Allergen Reactions   Codeine Nausea And Vomiting   Tylenol [Acetaminophen] Hives      OBJECTIVE:  Physical Exam  Vitals:   06/04/22 1247 06/04/22 1249  BP: (!) 157/67 126/74  Pulse: 72 80  Weight: 168 lb (76.2 kg)   Height: 5\' 3"  (1.6 m)    Body mass index is 29.76 kg/m. No results found.   Physical exam  General: well developed, well nourished, very pleasant elderly Caucasian female, seated, in no evident distress Head: head normocephalic and atraumatic.   Neck: supple with no carotid or supraclavicular bruits Cardiovascular: regular rate and rhythm, no murmurs Musculoskeletal: no deformity Skin:  no rash/petichiae Vascular:  Normal pulses all extremities   Neurologic Exam Mental Status: Awake and fully alert.   Fluent speech and language.  No paraphasic errors.  Able to name repeat comprehend quite well.  Oriented to place and time. Recent and remote memory intact. Attention span, concentration and fund of knowledge appropriate. Mood and affect appropriate.  Cranial Nerves: Pupils equal, briskly reactive to light. Extraocular movements full without nystagmus. Visual fields full to confrontation. Hearing intact. Facial sensation intact. Face, tongue, palate moves normally and  symmetrically.  Motor: Normal bulk and tone. Normal strength in all tested extremity muscles. Sensory.: intact to touch , pinprick , position and vibratory sensation.  Coordination: Rapid alternating movements normal in all extremities. Finger-to-nose and heel-to-shin performed accurately bilaterally.unsteady on a narrow base. Gait and Station: Arises from chair without difficulty. Stance is normal. Gait demonstrates normal stride length and balance./Difficulty  with tandem walking.  Reflexes: 1+ and symmetric. Toes downgoing.   NIH stroke scale 0 Modified Rankin score 1    ASSESSMENT: Lisa [name] is a 77 y.o. year old female presented with transient dizziness, L>R leg weakness and bilateral arm weakness on 04/05/2019 with stroke work-up revealing 3-4 punctate bilateral MCA/ACA watershed infarcts embolic secondary to unknown source therefore loop recorder placed but no A. fib found so far.  .   Vascular risk factors include HLD, mild intracranial stenosis, prior stroke on imaging and advanced age.  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  She is doing     PLAN: I had a long d/w patient  and her husband about her prior  strokes, risk for recurrent stroke/TIAs, personally independently reviewed imaging studies and stroke evaluation results and answered questions.Continue Plavix 75 mg daily alone and discontinue aspirin now for secondary stroke prevention and maintain strict control of hypertension with blood pressure goal below 130/90, diabetes with hemoglobin A1c goal below 6.5% and lipids with LDL cholesterol goal below 70 mg/dL. I also advised the patient to eat a healthy diet with plenty of whole grains, cereals, fruits and vegetables, exercise regularly and maintain ideal body weight.  Check follow-up carotid ultrasound study.  Followup in the future with me in the future only as necessary or call earlier if necessary. Greater than 50%  time during this 35-minute    visit was spent on counseling and coordination of care about her recurrent cryptogenic stroke and discussion of stroke prevention treatment and answering questions.    Delia Heady, MD  [facility] [address] [place], [place] [address]  Phone [phone] Fax [phone] Note: This document was prepared with digital dictation and possible smart phrase technology. Any transcriptional errors that result from this process are unintentional.

## 2022-06-10 NOTE — Progress Notes (Signed)
Carelink Summary Report / Loop Recorder 

## 2022-06-18 ENCOUNTER — Ambulatory Visit (INDEPENDENT_AMBULATORY_CARE_PROVIDER_SITE_OTHER): Payer: Medicare Other

## 2022-06-18 DIAGNOSIS — I639 Cerebral infarction, unspecified: Secondary | ICD-10-CM | POA: Diagnosis not present

## 2022-06-19 LAB — CUP PACEART REMOTE DEVICE CHECK
Date Time Interrogation Session: 20240529231002
Implantable Pulse Generator Implant Date: 20210318

## 2022-07-07 ENCOUNTER — Ambulatory Visit (HOSPITAL_COMMUNITY)
Admission: RE | Admit: 2022-07-07 | Discharge: 2022-07-07 | Disposition: A | Discharge status: Home / Self Care | Payer: Medicare Other | Source: Ambulatory Visit | Attending: Neurology | Admitting: Neurology

## 2022-07-07 DIAGNOSIS — I6990 Unspecified sequelae of unspecified cerebrovascular disease: Secondary | ICD-10-CM

## 2022-07-07 NOTE — Progress Notes (Signed)
Bilateral carotid ultrasound study completed.   Please see CV Procedures for preliminary results.  Keziah Avis, RVT  10:14 AM 07/07/22

## 2022-07-10 NOTE — Progress Notes (Signed)
Carelink Summary Report / Loop Recorder 

## 2022-07-18 NOTE — Progress Notes (Signed)
Kindly inform patient that carotid ultrasound shows no major blockages

## 2022-07-21 ENCOUNTER — Encounter: Payer: Self-pay | Admitting: Anesthesiology

## 2022-07-21 ENCOUNTER — Ambulatory Visit (INDEPENDENT_AMBULATORY_CARE_PROVIDER_SITE_OTHER): Payer: Medicare Other

## 2022-07-21 DIAGNOSIS — I639 Cerebral infarction, unspecified: Secondary | ICD-10-CM

## 2022-07-22 LAB — CUP PACEART REMOTE DEVICE CHECK
Date Time Interrogation Session: 20240701231057
Implantable Pulse Generator Implant Date: 20210318

## 2022-08-11 NOTE — Progress Notes (Signed)
Carelink Summary Report / Loop Recorder 

## 2022-08-18 IMAGING — MG MM DIGITAL SCREENING BILAT W/ TOMO AND CAD
6 of 12 series · 6 of 36 positions shown · non-contrast
Comparison: Previous exam(s).

CLINICAL DATA: Screening.

EXAM:
DIGITAL SCREENING BILATERAL MAMMOGRAM WITH TOMOSYNTHESIS AND CAD
TECHNIQUE: Bilateral screening digital craniocaudal and mediolateral oblique
mammograms were obtained. Bilateral screening digital breast
tomosynthesis was performed. The images were evaluated with
computer-aided detection.

[L CC synth-2D (1 of 2)]
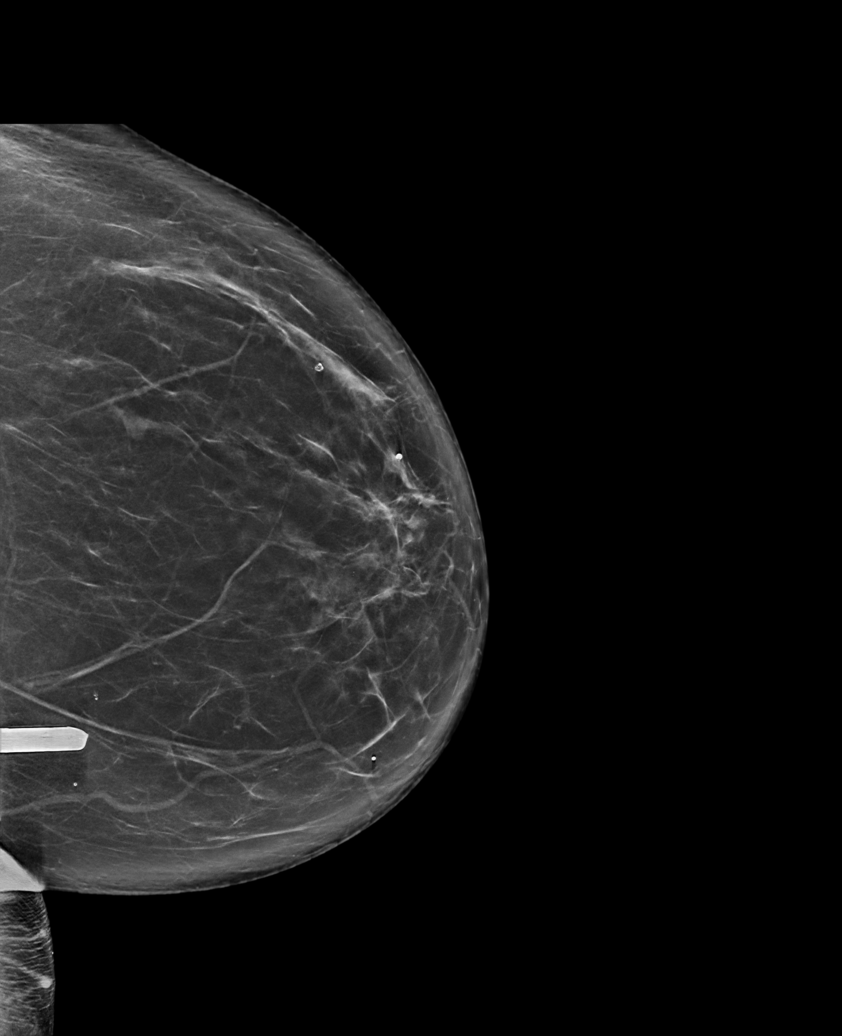

[R CC synth-2D (1 of 2)]
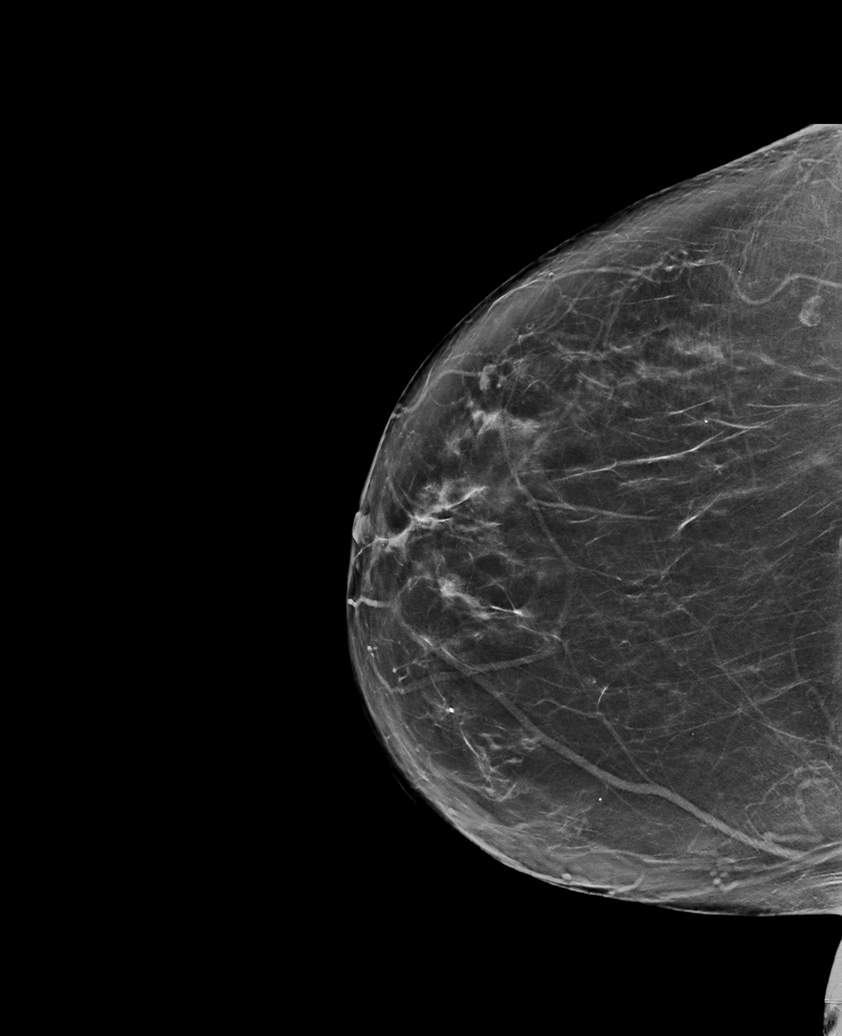

[R CC synth-2D (2 of 2)]
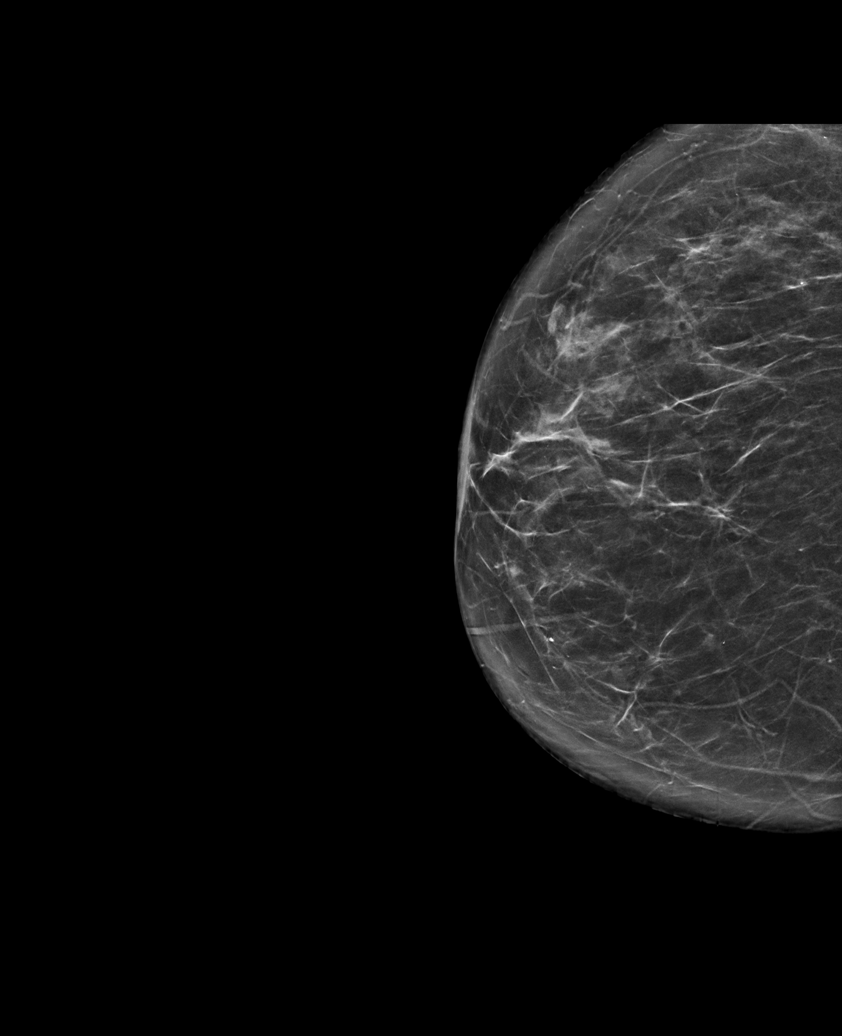

[L MLO synth-2D]
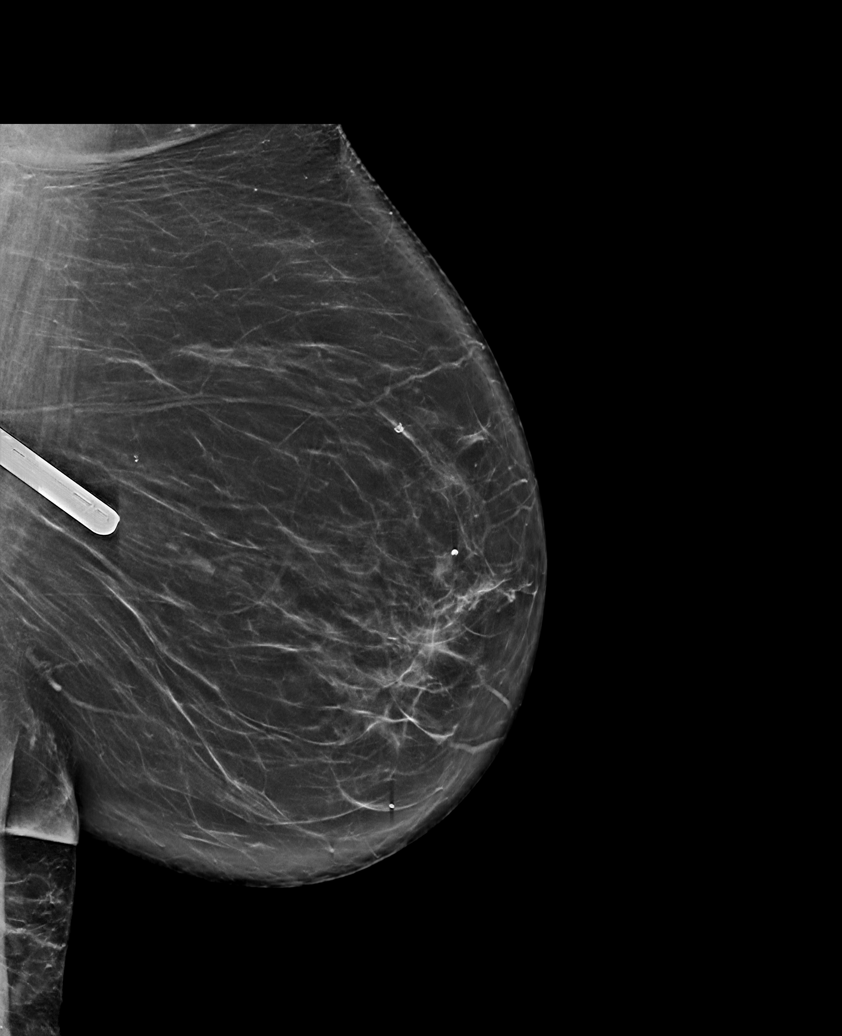

[L CC synth-2D (2 of 2)]
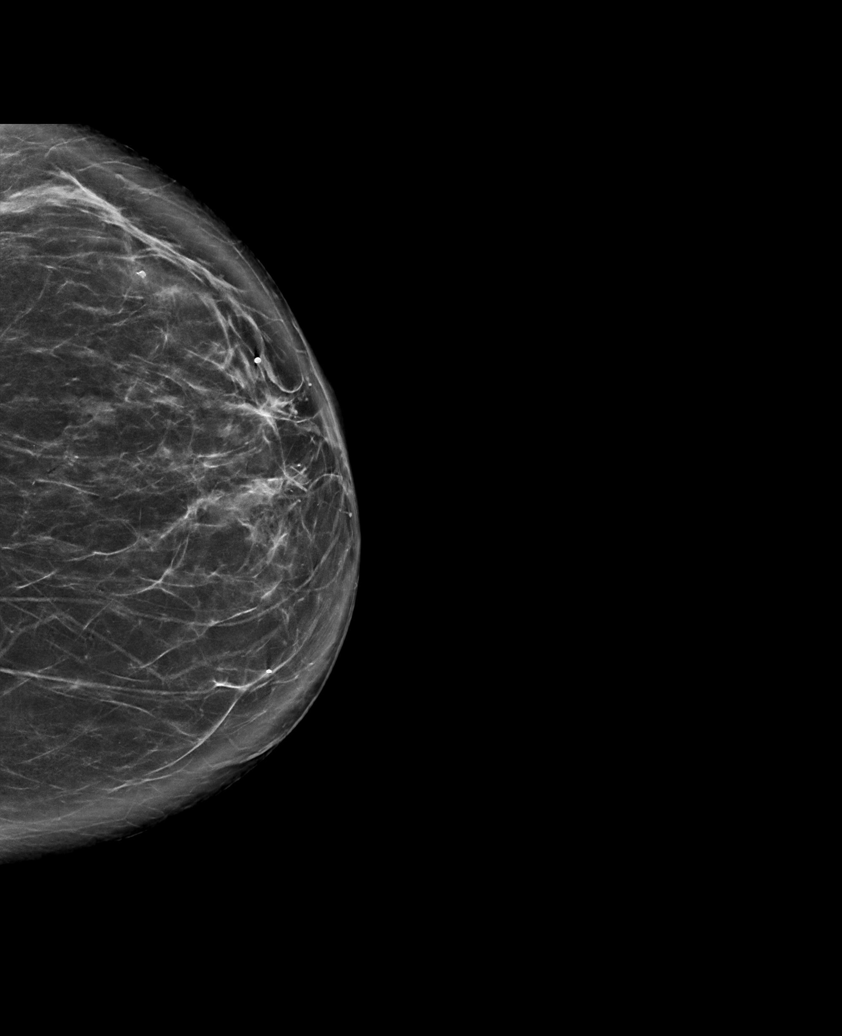

[R MLO synth-2D]
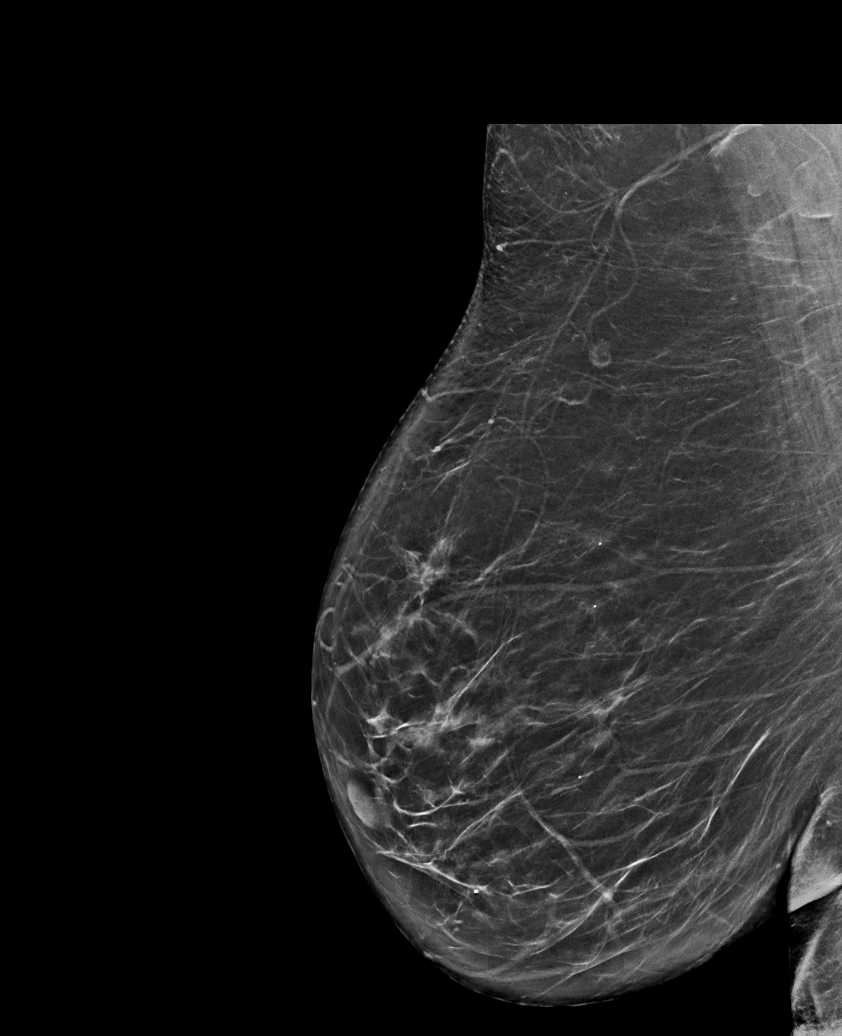

[6 of 36 positions shown; findings below may reference images not displayed]

ACR Breast Density Category b: There are scattered areas of
fibroglandular density.
FINDINGS: There are no findings suspicious for malignancy.
IMPRESSION: No mammographic evidence of malignancy. A result letter of this
screening mammogram will be mailed directly to the patient.

RECOMMENDATION:
Screening mammogram in one year. (Code:51-O-LD2)

BI-RADS CATEGORY  1: Negative.

## 2022-08-24 LAB — CUP PACEART REMOTE DEVICE CHECK
Date Time Interrogation Session: 20240803230725
Implantable Pulse Generator Implant Date: 20210318

## 2022-08-25 ENCOUNTER — Ambulatory Visit: Payer: Medicare Other

## 2022-08-25 DIAGNOSIS — I639 Cerebral infarction, unspecified: Secondary | ICD-10-CM | POA: Diagnosis not present

## 2022-09-09 NOTE — Progress Notes (Signed)
Carelink Summary Report / Loop Recorder 

## 2022-09-29 ENCOUNTER — Ambulatory Visit: Payer: Medicare Other

## 2022-09-29 DIAGNOSIS — I639 Cerebral infarction, unspecified: Secondary | ICD-10-CM | POA: Diagnosis not present

## 2022-09-29 LAB — CUP PACEART REMOTE DEVICE CHECK
Date Time Interrogation Session: 20240905231043
Implantable Pulse Generator Implant Date: 20210318

## 2022-10-06 ENCOUNTER — Telehealth: Payer: Self-pay

## 2022-10-06 NOTE — Telephone Encounter (Signed)
Following alert received from [employer] received for ILR alert for tachy x2, events occurred 9/12 @ 15:21, and 15:43, longest duration 23sec, HR's 158-167. Narrow, regular rhythm.  Called patient who reports asymptomatic and doing well. Advised patient I will forward to Dr. Ladona Ridgel but anticipate no changes at this time. Pt aware to call if any symptoms arise.

## 2022-10-07 NOTE — Telephone Encounter (Signed)
Sinus vs atrial tachycardia. No change in treatment. GT

## 2022-10-16 NOTE — Progress Notes (Signed)
Carelink Summary Report / Loop Recorder 

## 2022-11-03 ENCOUNTER — Ambulatory Visit (INDEPENDENT_AMBULATORY_CARE_PROVIDER_SITE_OTHER): Payer: Medicare Other

## 2022-11-03 DIAGNOSIS — I639 Cerebral infarction, unspecified: Secondary | ICD-10-CM

## 2022-11-04 LAB — CUP PACEART REMOTE DEVICE CHECK
Date Time Interrogation Session: 20241013231536
Implantable Pulse Generator Implant Date: 20210318

## 2022-11-19 NOTE — Progress Notes (Signed)
Carelink Summary Report / Loop Recorder 

## 2022-12-08 ENCOUNTER — Ambulatory Visit (INDEPENDENT_AMBULATORY_CARE_PROVIDER_SITE_OTHER): Payer: Medicare Other

## 2022-12-08 DIAGNOSIS — I639 Cerebral infarction, unspecified: Secondary | ICD-10-CM | POA: Diagnosis not present

## 2022-12-08 LAB — CUP PACEART REMOTE DEVICE CHECK
Date Time Interrogation Session: 20241117230231
Implantable Pulse Generator Implant Date: 20210318

## 2023-01-02 NOTE — Progress Notes (Signed)
Carelink Summary Report / Loop Recorder 

## 2023-01-12 ENCOUNTER — Ambulatory Visit (INDEPENDENT_AMBULATORY_CARE_PROVIDER_SITE_OTHER): Payer: Medicare Other

## 2023-01-12 DIAGNOSIS — I639 Cerebral infarction, unspecified: Secondary | ICD-10-CM

## 2023-01-13 LAB — CUP PACEART REMOTE DEVICE CHECK
Date Time Interrogation Session: 20241222230028
Implantable Pulse Generator Implant Date: 20210318

## 2023-02-16 ENCOUNTER — Ambulatory Visit: Payer: Medicare Other

## 2023-02-16 DIAGNOSIS — I639 Cerebral infarction, unspecified: Secondary | ICD-10-CM

## 2023-02-16 LAB — CUP PACEART REMOTE DEVICE CHECK
Date Time Interrogation Session: 20250126230337
Implantable Pulse Generator Implant Date: 20210318

## 2023-02-23 NOTE — Progress Notes (Signed)
 [facility] Summary Report / Loop Recorder

## 2023-02-23 NOTE — Addendum Note (Signed)
Addended by: Geralyn Flash D on: 02/23/2023 09:36 AM   Modules accepted: Orders

## 2023-03-23 ENCOUNTER — Ambulatory Visit: Payer: Medicare Other

## 2023-03-23 DIAGNOSIS — I639 Cerebral infarction, unspecified: Secondary | ICD-10-CM

## 2023-03-25 LAB — CUP PACEART REMOTE DEVICE CHECK
Date Time Interrogation Session: 20250302230146
Implantable Pulse Generator Implant Date: 20210318

## 2023-03-27 NOTE — Progress Notes (Signed)
 [facility] Summary Report / Loop Recorder

## 2023-04-21 ENCOUNTER — Encounter: Payer: Self-pay | Admitting: Physical Therapy

## 2023-04-21 ENCOUNTER — Other Ambulatory Visit: Payer: Self-pay

## 2023-04-21 ENCOUNTER — Ambulatory Visit: Attending: Family Medicine | Admitting: Physical Therapy

## 2023-04-21 DIAGNOSIS — M25611 Stiffness of right shoulder, not elsewhere classified: Secondary | ICD-10-CM | POA: Insufficient documentation

## 2023-04-21 DIAGNOSIS — M25511 Pain in right shoulder: Secondary | ICD-10-CM | POA: Insufficient documentation

## 2023-04-21 DIAGNOSIS — G8929 Other chronic pain: Secondary | ICD-10-CM | POA: Diagnosis present

## 2023-04-21 DIAGNOSIS — M6281 Muscle weakness (generalized): Secondary | ICD-10-CM | POA: Diagnosis present

## 2023-04-21 NOTE — Therapy (Signed)
 OUTPATIENT PHYSICAL THERAPY SHOULDER EVALUATION   Patient Name: Lisa [name] MRN: [id number] DOB:09-Aug-1945, 78 y.o., female Today's Date: 04/21/2023  END OF SESSION:  PT End of Session - 04/21/23 1348     Visit Number 1    Number of Visits 13    Date for PT Re-Evaluation 06/02/23    Authorization Type MCR    Authorization Time Period 04/21/23 to 06/02/23    Progress Note Due on Visit 10    PT Start Time 1347    PT Stop Time 1425    PT Time Calculation (min) 38 min    Activity Tolerance Patient tolerated treatment well    Behavior During Therapy [facility] for tasks assessed/performed             Past Medical History:  Diagnosis Date   Stroke (HCC) 04/06/2019   Uterine cancer (HCC) 01/25/2016   Past Surgical History:  Procedure Laterality Date   BREAST EXCISIONAL BIOPSY Left 1975   LOOP RECORDER INSERTION N/A 04/07/2019   Procedure: LOOP RECORDER INSERTION;  Surgeon: Marinus Maw, MD;  Location: [facility];  Service: Cardiovascular;  Laterality: N/A;   TUBAL LIGATION  1970   Patient Active Problem List   Diagnosis Date Noted   Acquired hammer toe of right foot 05/22/2021   Aphasia 05/22/2021   Dyslipidemia 05/22/2021   Essential hypertension 05/22/2021   Heartburn 05/22/2021   Hypercholesterolemia 05/22/2021   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   Osteopenia of left hip 05/22/2021   Personal history of malignant neoplasm of other parts of uterus 05/22/2021   Psoriasis 05/22/2021   Urticarial rash 05/22/2021   Amnesia 08/02/2020   Acute CVA (cerebrovascular accident) (HCC) 04/07/2019   CVA (cerebral vascular accident) (HCC) 04/05/2019   TIA (transient ischemic attack) 04/05/2019   Endometrial cancer (HCC) 04/22/2017   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    PCP: Gweneth Dimitri, MD  REFERRING PROVIDER: Gweneth Dimitri, MD  REFERRING DIAG: Diagnosis M25.511 (ICD-10-CM) - Pain in right shoulder  THERAPY DIAG:   Chronic right shoulder pain  Muscle weakness (generalized)  Stiffness of right shoulder, not elsewhere classified  Rationale for Evaluation and Treatment: Rehabilitation  ONSET DATE: about 2 months ago   SUBJECTIVE:                                                                                                                                                                                      SUBJECTIVE STATEMENT:  I've been having issues with my R arm, don't remember doing anything that caused the injury. It comes and goes, I have good and bad days. When I lift anything like  the brita my right arm just really hurts. I'm not sure why I'm here now, it feels good.   Hand dominance: Right  PERTINENT HISTORY: See above   PAIN:  Are you having pain? No 0/10 now  PRECAUTIONS: None  RED FLAGS: None   WEIGHT BEARING RESTRICTIONS: No  FALLS:  Has patient fallen in last 6 months? No  LIVING ENVIRONMENT: Lives with: lives with their spouse Lives in: House/apartment   OCCUPATION: Retired   PLOF: Independent, Independent with basic ADLs, Independent with gait, and Independent with transfers  PATIENT GOALS:get full use of the arm back   NEXT MD VISIT:   OBJECTIVE:  Note: Objective measures were completed at Evaluation unless otherwise noted.    PATIENT SURVEYS:   Patient-Specific Activity Scoring Scheme  "0" represents "unable to perform." "10" represents "able to perform at prior level. 0 1 2 3 4 5 6 7 8 9  10 (Date and Score)   Activity  Eval     1. Carrying anything  7     2. Moving arm  10     3.     4.    5.    Score 8.5    Total score = sum of the activity scores/number of activities Minimum detectable change (90%CI) for average score = 2 points Minimum detectable change (90%CI) for single activity score = 3 points       COGNITION: Overall cognitive status: Within functional limits for tasks assessed     SENSATION: Not  tested  POSTURE:  Thoracic kyphosis, tends to hold L shoulder higher than R   UPPER EXTREMITY ROM:   Active ROM Right eval Left eval  Shoulder flexion 128* 144*  Shoulder extension    Shoulder abduction 120* 138*  Shoulder adduction    Shoulder internal rotation T7 painful  T7  Shoulder external rotation C7 C7  Elbow flexion    Elbow extension    Wrist flexion    Wrist extension    Wrist ulnar deviation    Wrist radial deviation    Wrist pronation    Wrist supination    (Blank rows = not tested)  UPPER EXTREMITY MMT:  MMT Right eval Left eval  Shoulder flexion 3+ 4  Shoulder extension    Shoulder abduction 3 4-  Shoulder adduction    Shoulder internal rotation 4+ 4+  Shoulder external rotation 4- 4+  Middle trapezius    Lower trapezius    Elbow flexion    Elbow extension    Wrist flexion    Wrist extension    Wrist ulnar deviation    Wrist radial deviation    Wrist pronation    Wrist supination    Grip strength (lbs)    (Blank rows = not tested)  TREATMENT DATE:   04/21/23  Eval, POC, HEP   UBE L2.5 x4 min forward/x4 min backwards at level 1.5 due to shoulder pain when direction was reversed   Shoulder flexion wall slides x10 R Seated shoulder ABD table slides x10 R Scapular retractions- attempted but unable to do with good form, needs more practice/kept combining with other exercise  Corner stretch 2x30 seconds Seated scap retractions no resistance x10      PATIENT EDUCATION: Education details: exam findings, POC, HEP  Person educated: Patient Education method: Explanation, Demonstration, and Handouts Education comprehension: verbalized understanding, returned demonstration, and needs further education  HOME EXERCISE PROGRAM: Access Code: [email or web address] URL: [email or web address] Date:  04/21/2023 Prepared by: Nedra Hai  Exercises - Standing Shoulder Flexion Stretch on Wall  - 2 x daily - 7 x weekly - 1 sets - 10 reps - 3 seconds  hold - Seated Shoulder Abduction Towel Slide at Table Top  - 2 x daily - 7 x weekly - 1 sets - 10 reps - 3 seconds  hold - Corner Pec Major Stretch  - 2 x daily - 7 x weekly - 1 sets - 3 reps - 30 seconds  hold - Seated Scapular Retraction  - 2 x daily - 7 x weekly - 1 sets - 10 reps - 2 seconds  hold  ASSESSMENT:  CLINICAL IMPRESSION: Patient is a 78 y.o. F who was seen today for physical therapy evaluation and treatment for Diagnosis M25.511 (ICD-10-CM) - Pain in right shoulder. Objectives as above, main impairments seem to be shoulder stiffness and weakness as well as mild postural limitations. It was a little difficult to get a full and clear history of her shoulder pain and related limitations due to aphasia/word finding issues from previous CVA.  Anticipate that she will likely respond well to a short course of skilled PT services.   OBJECTIVE IMPAIRMENTS: decreased ROM, decreased strength, impaired UE functional use, postural dysfunction, and pain.   ACTIVITY LIMITATIONS: carrying, lifting, toileting, dressing, reach over head, and hygiene/grooming  PARTICIPATION LIMITATIONS: meal prep, cleaning, laundry, driving, shopping, and community activity  PERSONAL FACTORS: Age, Fitness, Past/current experiences, and Time since onset of injury/illness/exacerbation are also affecting patient's functional outcome.   REHAB POTENTIAL: Good  CLINICAL DECISION MAKING: Stable/uncomplicated  EVALUATION COMPLEXITY: Low   GOALS: Goals reviewed with patient? No  SHORT TERM GOALS: Target date: 05/12/2023    Patient will be compliant with appropriate progressive HEP        GOAL STATUS: Initial  2. R shoulder flexion and ABD AROM to be equal to that of the left          GOAL STATUS: Initial                         3. Will be more aware of  posture with all functional tasks with use of ergonomic aides PRN/as desired      GOAL STATUS: Initial    LONG TERM GOALS: Target date: 06/02/2023     MMT to have improved by at least one grade in all weak groups       GOAL STATUS: Initial    2. Pain to be 0/10 with all functional tasks R shoulder      GOAL STATUS: Initial   3. Will be able to perform all functional household and personal care/dressing based tasks without increase from resting pain levels     GOAL STATUS: Initial   5. PSFS to have  improved by at least 1.5 points to show improved QOL and subjective perception of condition      GOAL STATUS: Initial 8.5/10   PLAN:  PT FREQUENCY: 2x/week  PT DURATION: 6 weeks  PLANNED INTERVENTIONS: 97164- PT Re-evaluation, 97110-Therapeutic exercises, 97530- Therapeutic activity, 97112- Neuromuscular re-education, 97535- Self Care, 27782- Manual therapy, 97016- Vasopneumatic device, Taping, and Dry Needling  PLAN FOR NEXT SESSION: shoulder ROM and strength, postural training, general conditioning   Nedra Hai, PT, DPT 04/21/23 2:26 PM

## 2023-04-24 NOTE — Addendum Note (Signed)
 Addended by: Geralyn Flash D on: 04/24/2023 01:56 PM   Modules accepted: Orders

## 2023-04-24 NOTE — Progress Notes (Signed)
 [facility] Summary Report / Loop Recorder

## 2023-04-27 ENCOUNTER — Ambulatory Visit (INDEPENDENT_AMBULATORY_CARE_PROVIDER_SITE_OTHER): Payer: Medicare Other

## 2023-04-27 DIAGNOSIS — I639 Cerebral infarction, unspecified: Secondary | ICD-10-CM

## 2023-04-28 ENCOUNTER — Encounter: Payer: Self-pay | Admitting: Internal Medicine

## 2023-04-28 LAB — CUP PACEART REMOTE DEVICE CHECK
Date Time Interrogation Session: 20250406230057
Implantable Pulse Generator Implant Date: 20210318

## 2023-04-30 ENCOUNTER — Ambulatory Visit: Admitting: Physical Therapy

## 2023-04-30 DIAGNOSIS — G8929 Other chronic pain: Secondary | ICD-10-CM

## 2023-04-30 DIAGNOSIS — M25511 Pain in right shoulder: Secondary | ICD-10-CM | POA: Diagnosis not present

## 2023-04-30 DIAGNOSIS — M6281 Muscle weakness (generalized): Secondary | ICD-10-CM

## 2023-04-30 DIAGNOSIS — M25611 Stiffness of right shoulder, not elsewhere classified: Secondary | ICD-10-CM

## 2023-04-30 NOTE — Therapy (Signed)
 OUTPATIENT PHYSICAL THERAPY SHOULDER    Patient Name: Lisa [name] MRN: [id number] DOB:[birth year], 78 y.o., female Today's Date: 04/30/2023  END OF SESSION:  PT End of Session - 04/30/23 0924     Visit Number 2    Number of Visits 13    Date for PT Re-Evaluation 06/02/23    Authorization Type MCR    Authorization Time Period 04/21/23 to 06/02/23    PT Start Time 0925    PT Stop Time 1010    PT Time Calculation (min) 45 min             Past Medical History:  Diagnosis Date   Stroke (HCC) 04/06/2019   Uterine cancer (HCC) 01/25/2016   Past Surgical History:  Procedure Laterality Date   BREAST EXCISIONAL BIOPSY Left 1975   LOOP RECORDER INSERTION N/A 04/07/2019   Procedure: LOOP RECORDER INSERTION;  Surgeon: Marinus Maw, MD;  Location: [facility];  Service: Cardiovascular;  Laterality: N/A;   TUBAL LIGATION  1970   Patient Active Problem List   Diagnosis Date Noted   Acquired hammer toe of right foot 05/22/2021   Aphasia 05/22/2021   Dyslipidemia 05/22/2021   Essential hypertension 05/22/2021   Heartburn 05/22/2021   Hypercholesterolemia 05/22/2021   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   Osteopenia of left hip 05/22/2021   Personal history of malignant neoplasm of other parts of uterus 05/22/2021   Psoriasis 05/22/2021   Urticarial rash 05/22/2021   Amnesia 08/02/2020   Acute CVA (cerebrovascular accident) (HCC) 04/07/2019   CVA (cerebral vascular accident) (HCC) 04/05/2019   TIA (transient ischemic attack) 04/05/2019   Endometrial cancer (HCC) 04/22/2017   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 Doing HEP without issue  Hand dominance: Right  PERTINENT HISTORY: See above   PAIN:  Are you having pain? No 0/10 now  PRECAUTIONS: None  RED FLAGS: None   WEIGHT BEARING RESTRICTIONS: No  FALLS:  Has patient fallen in last 6 months? No  LIVING ENVIRONMENT: Lives with: lives with their spouse Lives  in: House/apartment   OCCUPATION: Retired   PLOF: Independent, Independent with basic ADLs, Independent with gait, and Independent with transfers  PATIENT GOALS:get full use of the arm back   NEXT MD VISIT:   OBJECTIVE:  Note: Objective measures were completed at Evaluation unless otherwise noted.    PATIENT SURVEYS:   Patient-Specific Activity Scoring Scheme  "0" represents "unable to perform." "10" represents "able to perform at prior level. 0 1 2 3 4 5 6 7 8 9  10 (Date and Score)   Activity  Eval     1. Carrying anything  7     2. Moving arm  10     3.     4.    5.    Score 8.5    Total score = sum of the activity scores/number of activities Minimum detectable change (90%CI) for average score = 2 points Minimum detectable change (90%CI) for single activity score = 3 points       COGNITION: Overall cognitive status: Within functional limits for tasks assessed     SENSATION: Not tested  POSTURE:  Thoracic kyphosis, tends to hold L shoulder higher than R   UPPER EXTREMITY ROM:   Active ROM Right eval Left eval  Shoulder flexion 128* 144*  Shoulder extension    Shoulder abduction 120* 138*  Shoulder adduction    Shoulder internal rotation T7 painful  T7  Shoulder external rotation C7 C7  Elbow flexion    Elbow extension    Wrist flexion    Wrist extension     Wrist ulnar deviation    Wrist radial deviation    Wrist pronation    Wrist supination    (Blank rows = not tested)  UPPER EXTREMITY MMT:  MMT Right eval Left eval  Shoulder flexion 3+ 4  Shoulder extension    Shoulder abduction 3 4-  Shoulder adduction    Shoulder internal rotation 4+ 4+  Shoulder external rotation 4- 4+  Middle trapezius    Lower trapezius    Elbow flexion    Elbow extension    Wrist flexion    Wrist extension    Wrist ulnar deviation    Wrist radial deviation    Wrist pronation    Wrist supination    Grip strength (lbs)    (Blank rows = not tested)                                                                                                                                TREATMENT DATE:   04/30/23 UBE L 2 2 min each way Finger ladder flexion 8x and abd 8 x with 2# eccentric lowering Red tband row 2 sets 10- verb and tactile cuing needed 2# wt cane OH press seated 10 x Yellow ball chest press 10 x then OH 10 x Supine 3# cane flexion 10 x 2 sets  Supine 2# chest press 10 x then flexion 10  x SL 2# abd and ER 2 sets 10 PROM and joint capsule stretch Ionto RT lateral shld 1.2 cc dex 4 hour leave on patch- edu con use and wear time   04/21/23  Eval, POC, HEP   UBE L2.5 x4 min forward/x4 min backwards at level 1.5 due to shoulder pain when direction was reversed   Shoulder flexion wall slides x10 R Seated shoulder ABD table slides x10 R Scapular retractions- attempted but unable to do with good form, needs more practice/kept combining with other exercise  Corner stretch 2x30 seconds Seated scap retractions no resistance x10      PATIENT EDUCATION: Education details: exam findings, POC, HEP  Person educated: Patient Education method: Explanation, Demonstration, and Handouts Education comprehension: verbalized understanding, returned demonstration, and needs further education  HOME EXERCISE PROGRAM: Access Code: [email or web address] URL:  [email or web address] Date: 04/21/2023 Prepared by: Nedra Hai  Exercises - Standing Shoulder Flexion Stretch on Wall  - 2 x daily - 7 x weekly - 1 sets - 10 reps - 3 seconds  hold - Seated Shoulder Abduction Towel Slide at Table Top  - 2 x daily - 7 x weekly - 1 sets - 10 reps - 3 seconds  hold - Corner Pec Major Stretch  - 2 x daily - 7 x weekly - 1 sets - 3 reps - 30 seconds  hold - Seated Scapular Retraction  - 2 x daily - 7 x weekly - 1 sets - 10 reps - 2 seconds  hold  ASSESSMENT:  CLINICAL IMPRESSION:  pt needed verb and tactile cuing with ex but did well. Some pain at end range. Pain with end range stretching and jt capsule,overall very good ROM with ex. Pin point tenderness lateral shld to trial of ionto. Pt verb doing HEP without issue    Patient is a 78 y.o. F who was seen today for physical therapy evaluation and treatment for Diagnosis M25.511 (ICD-10-CM) - Pain in right shoulder. Objectives as above, main impairments seem to be shoulder stiffness and weakness as well as mild postural limitations. It was a little difficult to get a full and clear history of her shoulder pain and related limitations due to aphasia/word finding issues from previous CVA.  Anticipate that she will likely respond well to a short course of skilled PT services.   OBJECTIVE IMPAIRMENTS: decreased ROM, decreased strength, impaired UE functional use, postural dysfunction, and pain.   ACTIVITY LIMITATIONS: carrying, lifting, toileting, dressing, reach over head, and hygiene/grooming  PARTICIPATION LIMITATIONS: meal prep, cleaning, laundry, driving, shopping, and community activity  PERSONAL FACTORS: Age, Fitness, Past/current experiences, and Time since onset of injury/illness/exacerbation are also affecting patient's functional outcome.   REHAB POTENTIAL: Good  CLINICAL DECISION MAKING: Stable/uncomplicated  EVALUATION COMPLEXITY: Low   GOALS: Goals reviewed with patient?  No  SHORT TERM GOALS: Target date: 05/12/2023    Patient will be compliant with appropriate progressive HEP        GOAL STATUS: Initial  2. R shoulder flexion and ABD AROM to be equal to that of the left          GOAL STATUS: Initial                         3. Will be more aware of posture with all functional tasks with use of ergonomic aides PRN/as desired      GOAL STATUS: Initial    LONG TERM GOALS: Target date: 06/02/2023     MMT  to have improved by at least one grade in all weak groups       GOAL STATUS: Initial    2. Pain to be 0/10 with all functional tasks R shoulder      GOAL STATUS: Initial   3. Will be able to perform all functional household and personal care/dressing based tasks without increase from resting pain levels     GOAL STATUS: Initial   5. PSFS to have improved by at least 1.5 points to show improved QOL and subjective perception of condition      GOAL STATUS: Initial 8.5/10   PLAN:  PT FREQUENCY: 2x/week  PT DURATION: 6 weeks  PLANNED INTERVENTIONS: 97164- PT Re-evaluation, 97110-Therapeutic exercises, 97530- Therapeutic activity, 97112- Neuromuscular re-education, 97535- Self Care, 10272- Manual therapy, 97016- Vasopneumatic device, Taping, and Dry Needling  PLAN FOR NEXT SESSION: Assess ionto. shoulder ROM and strength, postural training, general conditioning    Patient Details  Name: TOBI GROESBECK MRN: [id number] Date of Birth: [birth year] Referring Provider:  Gweneth Dimitri, MD  Encounter Date: 04/30/2023   Suanne Marker, PTA 04/30/2023, 9:25 AM  [facility] at [facility] [address] [place], [place], [address] Phone: [phone]   Fax:  [phone]

## 2023-05-01 ENCOUNTER — Ambulatory Visit: Admitting: Physical Therapy

## 2023-05-01 DIAGNOSIS — M25511 Pain in right shoulder: Secondary | ICD-10-CM | POA: Diagnosis not present

## 2023-05-01 DIAGNOSIS — M25611 Stiffness of right shoulder, not elsewhere classified: Secondary | ICD-10-CM

## 2023-05-01 DIAGNOSIS — G8929 Other chronic pain: Secondary | ICD-10-CM

## 2023-05-01 DIAGNOSIS — M6281 Muscle weakness (generalized): Secondary | ICD-10-CM

## 2023-05-01 NOTE — Therapy (Signed)
 OUTPATIENT PHYSICAL THERAPY SHOULDER    Patient Name: Lisa [name] MRN: [id number] DOB:10-Jan-1946, 78 y.o., female Today's Date: 05/01/2023  END OF SESSION:  PT End of Session - 05/01/23 0922     Visit Number 3    Number of Visits 13    Date for PT Re-Evaluation 06/02/23    Authorization Type MCR    Authorization Time Period 04/21/23 to 06/02/23    PT Start Time 0925    PT Stop Time 1010    PT Time Calculation (min) 45 min             Past Medical History:  Diagnosis Date   Stroke (HCC) 04/06/2019   Uterine cancer (HCC) 01/25/2016   Past Surgical History:  Procedure Laterality Date   BREAST EXCISIONAL BIOPSY Left 1975   LOOP RECORDER INSERTION N/A 04/07/2019   Procedure: LOOP RECORDER INSERTION;  Surgeon: Lisa Maw, MD;  Location: [facility];  Service: Cardiovascular;  Laterality: N/A;   TUBAL LIGATION  1970   Patient Active Problem List   Diagnosis Date Noted   Acquired hammer toe of right foot 05/22/2021   Aphasia 05/22/2021   Dyslipidemia 05/22/2021   Essential hypertension 05/22/2021   Heartburn 05/22/2021   Hypercholesterolemia 05/22/2021   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   Osteopenia of left hip 05/22/2021   Personal history of malignant neoplasm of other parts of uterus 05/22/2021   Psoriasis 05/22/2021   Urticarial rash 05/22/2021   Amnesia 08/02/2020   Acute CVA (cerebrovascular accident) (HCC) 04/07/2019   CVA (cerebral vascular accident) (HCC) 04/05/2019   TIA (transient ischemic attack) 04/05/2019   Endometrial cancer (HCC) 04/22/2017   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    PCP: Lisa Dimitri, MD  REFERRING PROVIDER: Gweneth Dimitri, MD  REFERRING DIAG: Diagnosis M25.511 (ICD-10-CM) - Pain in right shoulder  THERAPY DIAG:  Chronic right shoulder pain  Muscle weakness (generalized)  Stiffness of right shoulder, not elsewhere classified  Rationale for Evaluation and Treatment:  Rehabilitation  ONSET DATE: about 2 months ago   SUBJECTIVE:                                                                                                                                                                                      SUBJECTIVE STATEMENT:  ABOUT THE SAME AS YESTERDAY. NO ISSUES WITH PATCH  Hand dominance: Right  PERTINENT HISTORY: See above   PAIN:  Are you having pain? No 0/10 now  PRECAUTIONS: None  RED FLAGS: None   WEIGHT BEARING RESTRICTIONS: No  FALLS:  Has patient fallen in last 6 months? No  LIVING ENVIRONMENT: Lives with: lives  with their spouse Lives in: House/apartment   OCCUPATION: Retired   PLOF: Independent, Independent with basic ADLs, Independent with gait, and Independent with transfers  PATIENT GOALS:get full use of the arm back   NEXT MD VISIT:   OBJECTIVE:  Note: Objective measures were completed at Evaluation unless otherwise noted.    PATIENT SURVEYS:   Patient-Specific Activity Scoring Scheme  "0" represents "unable to perform." "10" represents "able to perform at prior level. 0 1 2 3 4 5 6 7 8 9  10 (Date and Score)   Activity  Eval     1. Carrying anything  7     2. Moving arm  10     3.     4.    5.    Score 8.5    Total score = sum of the activity scores/number of activities Minimum detectable change (90%CI) for average score = 2 points Minimum detectable change (90%CI) for single activity score = 3 points       COGNITION: Overall cognitive status: Within functional limits for tasks assessed     SENSATION: Not tested  POSTURE:  Thoracic kyphosis, tends to hold L shoulder higher than R   UPPER EXTREMITY ROM:   Active ROM Right eval Left eval  Shoulder flexion 128* 144*  Shoulder extension    Shoulder abduction 120* 138*  Shoulder adduction    Shoulder internal rotation T7 painful  T7  Shoulder external rotation C7 C7  Elbow flexion    Elbow extension    Wrist flexion     Wrist extension    Wrist ulnar deviation    Wrist radial deviation    Wrist pronation    Wrist supination    (Blank rows = not tested)  UPPER EXTREMITY MMT:  MMT Right eval Left eval  Shoulder flexion 3+ 4  Shoulder extension    Shoulder abduction 3 4-  Shoulder adduction    Shoulder internal rotation 4+ 4+  Shoulder external rotation 4- 4+  Middle trapezius    Lower trapezius    Elbow flexion    Elbow extension    Wrist flexion    Wrist extension    Wrist ulnar deviation    Wrist radial deviation    Wrist pronation    Wrist supination    Grip strength (lbs)    (Blank rows = not tested)                                                                                                                                TREATMENT DATE:   05/01/23 UBE L 2 2 min each way Chest press with serratus 2 sets 10  5# Lat pull down 2 sets 10 15# Seated [name] 2 sets 10  15# Yellow tband ER and IR 2 sets 10  5# Yellow tband ABD 2 sets 10- difficult and min pain yellow tband HORZ ABD 2 sets 10 2# bent elbow abd PROM  end range and joint mobs- trouble relaxing  Ionto #2 lat shld/delt  04/30/23 UBE L 2 2 min each way Finger ladder flexion 8x and abd 8 x with 2# eccentric lowering Red tband [name] 2 sets 10- verb and tactile cuing needed 2# wt cane OH press seated 10 x Yellow ball chest press 10 x then OH 10 x Supine 3# cane flexion 10 x 2 sets  Supine 2# chest press 10 x then flexion 10 x SL 2# abd and ER 2 sets 10 PROM and joint capsule stretch Ionto RT lateral shld 1.2 cc dex 4 hour leave on patch- edu con use and wear time   04/21/23  Eval, POC, HEP   UBE L2.5 x4 min forward/x4 min backwards at level 1.5 due to shoulder pain when direction was reversed   Shoulder flexion wall slides x10 R Seated shoulder ABD table slides x10 R Scapular retractions- attempted but unable to do with good form, needs more practice/kept combining with other exercise  Corner stretch 2x30  seconds Seated scap retractions no resistance x10      PATIENT EDUCATION: Education details: exam findings, POC, HEP  Person educated: Patient Education method: Explanation, Demonstration, and Handouts Education comprehension: verbalized understanding, returned demonstration, and needs further education  HOME EXERCISE PROGRAM: Access Code: [email or web address] URL: [email or web address] Date: 04/21/2023 Prepared by: Nedra Hai  Exercises - Standing Shoulder Flexion Stretch on Wall  - 2 x daily - 7 x weekly - 1 sets - 10 reps - 3 seconds  hold - Seated Shoulder Abduction Towel Slide at Table Top  - 2 x daily - 7 x weekly - 1 sets - 10 reps - 3 seconds  hold - Corner Pec Major Stretch  - 2 x daily - 7 x weekly - 1 sets - 3 reps - 30 seconds  hold - Seated Scapular Retraction  - 2 x daily - 7 x weekly - 1 sets - 10 reps - 2 seconds  hold  ASSESSMENT:  CLINICAL IMPRESSION: assessed and documented STGs. pt just hear yesterday so no changes but no issues with ionto. Verb and tactile cuing needed with all interventions. A bit moe sore with end range stretching and joint mobs today and trouble relaxing- could be increased soreness for back to back session. Very fatigued with ex esp abd  Patient is a 78 y.o. F who was seen today for physical therapy evaluation and treatment for Diagnosis M25.511 (ICD-10-CM) - Pain in right shoulder. Objectives as above, main impairments seem to be shoulder stiffness and weakness as well as mild postural limitations. It was a little difficult to get a full and clear history of her shoulder pain and related limitations due to aphasia/word finding issues from previous CVA.  Anticipate that she will likely respond well to a short course of skilled PT services.   OBJECTIVE IMPAIRMENTS: decreased ROM, decreased strength, impaired UE functional use, postural dysfunction, and pain.   ACTIVITY LIMITATIONS: carrying, lifting, toileting, dressing, reach over head,  and hygiene/grooming  PARTICIPATION LIMITATIONS: meal prep, cleaning, laundry, driving, shopping, and community activity  PERSONAL FACTORS: Age, Fitness, Past/current experiences, and Time since onset of injury/illness/exacerbation are also affecting patient's functional outcome.   REHAB POTENTIAL: Good  CLINICAL DECISION MAKING: Stable/uncomplicated  EVALUATION COMPLEXITY: Low   GOALS: Goals reviewed with patient? No  SHORT TERM GOALS: Target date: 05/12/2023    Patient will be compliant with appropriate progressive HEP        GOAL STATUS: 05/01/23 MET  2. R shoulder  flexion and ABD AROM to be equal to that of the left          GOAL STATUS: progressing 05/01/23                         3. Will be more aware of posture with all functional tasks with use of ergonomic aides PRN/as desired      GOAL STATUS: progressing 4 /11/25   LONG TERM GOALS: Target date: 06/02/2023     MMT to have improved by at least one grade in all weak groups       GOAL STATUS: Initial    2. Pain to be 0/10 with all functional tasks R shoulder      GOAL STATUS: Initial   3. Will be able to perform all functional household and personal care/dressing based tasks without increase from resting pain levels     GOAL STATUS: Initial   5. PSFS to have improved by at least 1.5 points to show improved QOL and subjective perception of condition      GOAL STATUS: Initial 8.5/10   PLAN:  PT FREQUENCY: 2x/week  PT DURATION: 6 weeks  PLANNED INTERVENTIONS: 97164- PT Re-evaluation, 97110-Therapeutic exercises, 97530- Therapeutic activity, 97112- Neuromuscular re-education, 97535- Self Care, 16109- Manual therapy, 97016- Vasopneumatic device, Taping, and Dry Needling  PLAN FOR NEXT SESSION: Assess ionto. shoulder ROM and strength, postural training, general conditioning    Patient Details  Name: Lisa [name] MRN: [id number] Date of Birth: [birth year] Referring Provider:  Gweneth Dimitri,  MD  Encounter Date: 05/01/2023   Suanne Marker, PTA 05/01/2023, 9:23 AM  [facility] [place] [facility] at [facility] [address] [place], [place], [address] Phone: [phone]   Fax:  [phone] at [facility] [address] [place], [place], [address] Phone: [phone]   Fax:  [phone]  Patient Details  Name: Lisa [name] MRN: [id number] Date of Birth: 04-15-1945 Referring Provider:  Gweneth Dimitri, MD  Encounter Date: 05/01/2023   Suanne Marker, PTA 05/01/2023, 9:23 AM  [place] [facility] at [facility] [address] [place], [place], [address] Phone: [phone]   Fax:  [phone]

## 2023-05-05 ENCOUNTER — Ambulatory Visit: Admitting: Physical Therapy

## 2023-05-05 DIAGNOSIS — M6281 Muscle weakness (generalized): Secondary | ICD-10-CM

## 2023-05-05 DIAGNOSIS — M25511 Pain in right shoulder: Secondary | ICD-10-CM | POA: Diagnosis not present

## 2023-05-05 DIAGNOSIS — M25611 Stiffness of right shoulder, not elsewhere classified: Secondary | ICD-10-CM

## 2023-05-05 DIAGNOSIS — G8929 Other chronic pain: Secondary | ICD-10-CM

## 2023-05-05 NOTE — Therapy (Addendum)
 OUTPATIENT PHYSICAL THERAPY SHOULDER    Patient Name: Lisa [name] MRN: [id number] DOB:05-15-1945, 78 y.o., female Today's Date: 05/05/2023    END OF SESSION:  PT End of Session - 05/05/23 0926     Visit Number 4    Number of Visits 13    Date for PT Re-Evaluation 06/02/23    Authorization Type MCR    Authorization Time Period 04/21/23 to 06/02/23    Progress Note Due on Visit 10    PT Start Time 0930    PT Stop Time 1015    PT Time Calculation (min) 45 min             Past Medical History:  Diagnosis Date   Stroke (HCC) 04/06/2019   Uterine cancer (HCC) 01/25/2016   Past Surgical History:  Procedure Laterality Date   BREAST EXCISIONAL BIOPSY Left 1975   LOOP RECORDER INSERTION N/A 04/07/2019   Procedure: LOOP RECORDER INSERTION;  Surgeon: Tammie Fall, MD;  [facility];  Service: Cardiovascular;  Laterality: N/A;   TUBAL LIGATION  1970   Patient Active Problem List   Diagnosis Date Noted   Acquired hammer toe of right foot 05/22/2021   Aphasia 05/22/2021   Dyslipidemia 05/22/2021   Essential hypertension 05/22/2021   Heartburn 05/22/2021   Hypercholesterolemia 05/22/2021   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   Osteopenia of left hip 05/22/2021   Personal history of malignant neoplasm of other parts of uterus 05/22/2021   Psoriasis 05/22/2021   Urticarial rash 05/22/2021   Amnesia 08/02/2020   Acute CVA (cerebrovascular accident) (HCC) 04/07/2019   CVA (cerebral vascular accident) (HCC) 04/05/2019   TIA (transient ischemic attack) 04/05/2019   Endometrial cancer (HCC) 04/22/2017   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    PCP: Helyn Lobstein, MD  REFERRING PROVIDER: Helyn Lobstein, MD  REFERRING DIAG: Diagnosis M25.511 (ICD-10-CM) - Pain in right shoulder  THERAPY DIAG:  Chronic right shoulder pain  Muscle weakness (generalized)  Stiffness of right shoulder, not elsewhere  classified  Rationale for Evaluation and Treatment: Rehabilitation  ONSET DATE: about 2 months ago   SUBJECTIVE:                                                                                                                                                                                      SUBJECTIVE STATEMENT: Shoulder is "okay" but not 100%. She has been doing "some" of her HEP because she feels like it isn't helping. Her ionto patch is somewhat helping, but not making things worse.   Hand dominance: Right  PERTINENT HISTORY: See above   PAIN:  Are you having  pain? No 5/10 now  PRECAUTIONS: None  RED FLAGS: None   WEIGHT BEARING RESTRICTIONS: No  FALLS:  Has patient fallen in last 6 months? No  LIVING ENVIRONMENT: Lives with: lives with their spouse Lives in: House/apartment   OCCUPATION: Retired   PLOF: Independent, Independent with basic ADLs, Independent with gait, and Independent with transfers  PATIENT GOALS:get full use of the arm back   NEXT MD VISIT:   OBJECTIVE:  Note: Objective measures were completed at Evaluation unless otherwise noted.    PATIENT SURVEYS:   Patient-Specific Activity Scoring Scheme  "0" represents "unable to perform." "10" represents "able to perform at prior level. 0 1 2 3 4 5 6 7 8 9  10 (Date and Score)   Activity  Eval     1. Carrying anything  7     2. Moving arm  10     3.     4.    5.    Score 8.5    Total score = sum of the activity scores/number of activities Minimum detectable change (90%CI) for average score = 2 points Minimum detectable change (90%CI) for single activity score = 3 points       COGNITION: Overall cognitive status: Within functional limits for tasks assessed     SENSATION: Not tested  POSTURE:  Thoracic kyphosis, tends to hold L shoulder higher than R   UPPER EXTREMITY ROM:   Active ROM Right eval Left eval  Shoulder flexion 128* 144*  Shoulder extension    Shoulder  abduction 120* 138*  Shoulder adduction    Shoulder internal rotation T7 painful  T7  Shoulder external rotation C7 C7  Elbow flexion    Elbow extension    Wrist flexion    Wrist extension    Wrist ulnar deviation    Wrist radial deviation    Wrist pronation    Wrist supination    (Blank rows = not tested)  UPPER EXTREMITY MMT:  MMT Right eval Left eval  Shoulder flexion 3+ 4  Shoulder extension    Shoulder abduction 3 4-  Shoulder adduction    Shoulder internal rotation 4+ 4+  Shoulder external rotation 4- 4+  Middle trapezius    Lower trapezius    Elbow flexion    Elbow extension    Wrist flexion    Wrist extension    Wrist ulnar deviation    Wrist radial deviation    Wrist pronation    Wrist supination    Grip strength (lbs)    (Blank rows = not tested)                                                                                                                                TREATMENT DATE:  05/05/23 UBE L2 each way PROM and Jt mobs  Red band Resisted ER, IR 2x10 Row x10, yellow band x10 Yellow band Shoulder ext 2x10 Weighted Shoulder abd  x10- required constant cuing for form and shoulder elevation Chest press 2x10 3#  OH press 2x10 3# Rolling ball on the wall in shoulder flexion and abd circles  20 sec each direction Up and down 20 sec each #3 ionto       05/01/23 UBE L 2 2 min each way Chest press with serratus 2 sets 10  5# Lat pull down 2 sets 10 15# Seated row 2 sets 10  15# Yellow tband ER and IR 2 sets 10  5# Yellow tband ABD 2 sets 10- difficult and min pain yellow tband HORZ ABD 2 sets 10 2# bent elbow abd PROM end range and joint mobs- trouble relaxing  Ionto #2 lat shld/delt  04/30/23 UBE L 2 2 min each way Finger ladder flexion 8x and abd 8 x with 2# eccentric lowering Red tband row 2 sets 10- verb and tactile cuing needed 2# wt cane OH press seated 10 x Yellow ball chest press 10 x then OH 10 x Supine 3# cane flexion  10 x 2 sets  Supine 2# chest press 10 x then flexion 10 x SL 2# abd and ER 2 sets 10 PROM and joint capsule stretch Ionto RT lateral shld 1.2 cc dex 4 hour leave on patch- edu con use and wear time   04/21/23  Eval, POC, HEP   UBE L2.5 x4 min forward/x4 min backwards at level 1.5 due to shoulder pain when direction was reversed   Shoulder flexion wall slides x10 R Seated shoulder ABD table slides x10 R Scapular retractions- attempted but unable to do with good form, needs more practice/kept combining with other exercise  Corner stretch 2x30 seconds Seated scap retractions no resistance x10      PATIENT EDUCATION: Education details: exam findings, POC, HEP  Person educated: Patient Education method: Explanation, Demonstration, and Handouts Education comprehension: verbalized understanding, returned demonstration, and needs further education  HOME EXERCISE PROGRAM: Access Code: [email or web address] URL: [email or web address] Date: 04/21/2023 Prepared by: Nedra Hai  Exercises - Standing Shoulder Flexion Stretch on Wall  - 2 x daily - 7 x weekly - 1 sets - 10 reps - 3 seconds  hold - Seated Shoulder Abduction Towel Slide at Table Top  - 2 x daily - 7 x weekly - 1 sets - 10 reps - 3 seconds  hold - Corner Pec Major Stretch  - 2 x daily - 7 x weekly - 1 sets - 3 reps - 30 seconds  hold - Seated Scapular Retraction  - 2 x daily - 7 x weekly - 1 sets - 10 reps - 2 seconds  hold  ASSESSMENT:  CLINICAL IMPRESSION:   Pt. Arrived with mild pain in her Rt shoulder. She has only been doing some of her HEP because she feels like it isn't helping. Her Rt shoulder was aggravate by UBE. Shoulder Jt mobs and PROM was performed to  increse shulder extensibility. She had some guarding with shoulder abd and flex. She required verbal and tactile cues to keep her shoulder down, for proper form, and to fully extend her elbows during AROM. She left with a new ionto patch.  Patient is a 78  y.o. F who was seen today for physical therapy evaluation and treatment for Diagnosis M25.511 (ICD-10-CM) - Pain in right shoulder. Objectives as above, main impairments seem to be shoulder stiffness and weakness as well as mild postural limitations. It was a little difficult to get a full and clear history of her shoulder pain and  related limitations due to aphasia/word finding issues from previous CVA.  Anticipate that she will likely respond well to a short course of skilled PT services.   OBJECTIVE IMPAIRMENTS: decreased ROM, decreased strength, impaired UE functional use, postural dysfunction, and pain.   ACTIVITY LIMITATIONS: carrying, lifting, toileting, dressing, reach over head, and hygiene/grooming  PARTICIPATION LIMITATIONS: meal prep, cleaning, laundry, driving, shopping, and community activity  PERSONAL FACTORS: Age, Fitness, Past/current experiences, and Time since onset of injury/illness/exacerbation are also affecting patient's functional outcome.   REHAB POTENTIAL: Good  CLINICAL DECISION MAKING: Stable/uncomplicated  EVALUATION COMPLEXITY: Low   GOALS: Goals reviewed with patient? No  SHORT TERM GOALS: Target date: 05/12/2023    Patient will be compliant with appropriate progressive HEP        GOAL STATUS: 05/01/23 MET  2. R shoulder flexion and ABD AROM to be equal to that of the left          GOAL STATUS: progressing 05/01/23                         3. Will be more aware of posture with all functional tasks with use of ergonomic aides PRN/as desired      GOAL STATUS: progressing 4 /11/25   LONG TERM GOALS: Target date: 06/02/2023     MMT to have improved by at least one grade in all weak groups       GOAL STATUS: Initial    2. Pain to be 0/10 with all functional tasks R shoulder      GOAL STATUS: Initial   3. Will be able to perform all functional household and personal care/dressing based tasks without increase from resting pain levels     GOAL STATUS:  Initial   5. PSFS to have improved by at least 1.5 points to show improved QOL and subjective perception of condition      GOAL STATUS: Initial 8.5/10   PLAN:  PT FREQUENCY: 2x/week  PT DURATION: 6 weeks  PLANNED INTERVENTIONS: 97164- PT Re-evaluation, 97110-Therapeutic exercises, 97530- Therapeutic activity, 97112- Neuromuscular re-education, 97535- Self Care, 16109- Manual therapy, 97016- Vasopneumatic device, Taping, and Dry Needling  PLAN FOR NEXT SESSION: shoulder ROM and strength, postural training, general conditioning    Patient Details  Name: KARISHMA UNREIN MRN: [id number] Date of Birth: Feb 19, 1945 Referring Provider:  Helyn Lobstein, MD  Encounter Date: 05/05/2023   Laurelyn Ponder 05/05/2023, 9:27 AM  [place] [facility] [address] [place], [place], [address] Phone: [phone]   Fax:  [phone] [address] [place], [place], [address] Phone: [phone]   Fax:  [phone]  Patient Details  Name: MARIVEL MCCLARTY MRN: [id number] Date of Birth: [birth year] Referring Provider:  Helyn Lobstein, MD  Encounter Date: 05/05/2023   Laurelyn Ponder 05/05/2023, 9:27 AM  [place] [facility] [address] [place], [place], [address] Phone: [phone]   Fax:  [phone] [address] [place], [place], [address] Phone: [phone]   Fax:  [phone]

## 2023-05-07 ENCOUNTER — Ambulatory Visit: Admitting: Physical Therapy

## 2023-05-07 ENCOUNTER — Encounter: Payer: Self-pay | Admitting: Physical Therapy

## 2023-05-07 DIAGNOSIS — M25611 Stiffness of right shoulder, not elsewhere classified: Secondary | ICD-10-CM

## 2023-05-07 DIAGNOSIS — M6281 Muscle weakness (generalized): Secondary | ICD-10-CM

## 2023-05-07 DIAGNOSIS — M25511 Pain in right shoulder: Secondary | ICD-10-CM | POA: Diagnosis not present

## 2023-05-07 DIAGNOSIS — G8929 Other chronic pain: Secondary | ICD-10-CM

## 2023-05-07 NOTE — Therapy (Signed)
 OUTPATIENT PHYSICAL THERAPY SHOULDER    Patient Name: Lisa [name] MRN: [id number] DOB:06-07-1945, 78 y.o., female Today's Date: 05/07/2023    END OF SESSION:  PT End of Session - 05/07/23 0926     Visit Number 5    Number of Visits 13    Date for PT Re-Evaluation 06/02/23    Authorization Type MCR    Authorization Time Period 04/21/23 to 06/02/23    Progress Note Due on Visit 10    PT Start Time 0930    PT Stop Time 1015    PT Time Calculation (min) 45 min             Past Medical History:  Diagnosis Date   Stroke (HCC) 04/06/2019   Uterine cancer (HCC) 01/25/2016   Past Surgical History:  Procedure Laterality Date   BREAST EXCISIONAL BIOPSY Left 1975   LOOP RECORDER INSERTION N/A 04/07/2019   Procedure: LOOP RECORDER INSERTION;  Surgeon: Marinus Maw, MD;  [facility]: [facility];  Service: Cardiovascular;  Laterality: N/A;   TUBAL LIGATION  1970   Patient Active Problem List   Diagnosis Date Noted   Acquired hammer toe of right foot 05/22/2021   Aphasia 05/22/2021   Dyslipidemia 05/22/2021   Essential hypertension 05/22/2021   Heartburn 05/22/2021   Hypercholesterolemia 05/22/2021   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   Osteopenia of left hip 05/22/2021   Personal history of malignant neoplasm of other parts of uterus 05/22/2021   Psoriasis 05/22/2021   Urticarial rash 05/22/2021   Amnesia 08/02/2020   Acute CVA (cerebrovascular accident) (HCC) 04/07/2019   CVA (cerebral vascular accident) (HCC) 04/05/2019   TIA (transient ischemic attack) 04/05/2019   Endometrial cancer (HCC) 04/22/2017   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  She isn't doing her HEP because she doesn't get anything from it and feels like it isn't helping  SUBJECTIVE STATEMEN    Hand dominance: Right  PERTINENT HISTORY: See above   PAIN:  Are you having pain? No 0/10 now  PRECAUTIONS: None  RED FLAGS: None   WEIGHT BEARING RESTRICTIONS: No  FALLS:  Has patient fallen in last 6 months? No  LIVING ENVIRONMENT: Lives with: lives with their spouse Lives in: House/apartment   OCCUPATION: Retired   PLOF: Independent, Independent with basic ADLs, Independent with gait, and Independent with transfers  PATIENT GOALS:get full use of the arm back   NEXT MD VISIT:   OBJECTIVE:  Note: Objective measures were completed at Evaluation unless otherwise noted.    PATIENT SURVEYS:   Patient-Specific Activity Scoring Scheme  "0" represents "unable to perform." "10" represents "able to perform at prior level. 0 1 2 3 4 5 6 7 8 9  10 (Date and Score)   Activity  Eval     1. Carrying anything  7     2. Moving arm  10     3.     4.    5.    Score 8.5  Total score = sum of the activity scores/number of activities Minimum detectable change (90%CI) for average score = 2 points Minimum detectable change (90%CI) for single activity score = 3 points       COGNITION: Overall cognitive status: Within functional limits for tasks assessed     SENSATION: Not tested  POSTURE:  Thoracic kyphosis, tends to hold L shoulder higher than R   UPPER EXTREMITY ROM:  Active ROM Right eval Left eval RT shld standing 05/07/23  Shoulder flexion 128* 144* 155*  Shoulder extension     Shoulder abduction 120* 138* 130*  Shoulder adduction     Shoulder internal rotation T7 painful  T7   Shoulder external rotation  C7 C7   Elbow flexion     Elbow extension     Wrist flexion     Wrist extension     Wrist ulnar deviation     Wrist radial deviation     Wrist pronation     Wrist supination     (Blank rows = not tested)  UPPER EXTREMITY MMT:  MMT Right eval Left eval Rt shoulder  05/07/23  Shoulder flexion 3+ 4 4  Shoulder extension     Shoulder abduction 3 4-   3+  Shoulder adduction     Shoulder internal rotation 4+ 4+   Shoulder external rotation 4- 4+   Middle trapezius     Lower trapezius     Elbow flexion     Elbow extension     Wrist flexion     Wrist extension     Wrist ulnar deviation     Wrist radial deviation     Wrist pronation     Wrist supination     Grip strength (lbs)     (Blank rows = not tested)                                                                                                                                TREATMENT DATE:  05/07/23 UBE L2 each way MMT and ROM noted below HEP [id number]  yellow tband shld flex and abd, ER, scap row and shld ext PROM- R shoulder all directions Wall slides R shoulder Flex and abd x10 each Target taps on wall with yellow band x10 each target       05/05/23 UBE L2 each way PROM and Jt mobs  Red band Resisted ER, IR 2x10 Row x10, yellow band x10 Yellow band Shoulder ext 2x10 Weighted Shoulder abd x10- required constant cuing for form and shoulder elevation Chest press 2x10 3#  OH press 2x10 3# Rolling ball on the wall in shoulder flexion and abd circles  20 sec each direction Up and down 20 sec each #3 ionto       05/01/23 UBE L 2 2 min each way Chest press with serratus 2 sets 10  5# Lat pull down 2 sets 10 15# Seated row 2  sets 10  15# Yellow tband ER and IR 2 sets 10  5# Yellow tband ABD 2 sets 10- difficult and min pain yellow tband HORZ ABD 2 sets 10 2# bent elbow abd PROM end range and joint mobs- trouble relaxing  Ionto #2 lat shld/delt  04/30/23 UBE L 2 2 min each way Finger  ladder flexion 8x and abd 8 x with 2# eccentric lowering Red tband row 2 sets 10- verb and tactile cuing needed 2# wt cane OH press seated 10 x Yellow ball chest press 10 x then OH 10 x Supine 3# cane flexion 10 x 2 sets  Supine 2# chest press 10 x then flexion 10 x SL 2# abd and ER 2 sets 10 PROM and joint capsule stretch Ionto RT lateral shld 1.2 cc dex 4 hour leave on patch- edu con use and wear time   04/21/23  Eval, POC, HEP   UBE L2.5 x4 min forward/x4 min backwards at level 1.5 due to shoulder pain when direction was reversed   Shoulder flexion wall slides x10 R Seated shoulder ABD table slides x10 R Scapular retractions- attempted but unable to do with good form, needs more practice/kept combining with other exercise  Corner stretch 2x30 seconds Seated scap retractions no resistance x10      PATIENT EDUCATION: Education details: exam findings, POC, HEP  Person educated: Patient Education method: Explanation, Demonstration, and Handouts Education comprehension: verbalized understanding, returned demonstration, and needs further education  HOME EXERCISE PROGRAM: Access Code: [email or web address] URL: [email or web address] Date: 04/21/2023 Prepared by: Terrel Ferries  Exercises - Standing Shoulder Flexion Stretch on Wall  - 2 x daily - 7 x weekly - 1 sets - 10 reps - 3 seconds  hold - Seated Shoulder Abduction Towel Slide at Table Top  - 2 x daily - 7 x weekly - 1 sets - 10 reps - 3 seconds  hold - Corner Pec Major Stretch  - 2 x daily - 7 x weekly - 1 sets - 3 reps - 30 seconds  hold - Seated Scapular Retraction  - 2 x daily - 7 x weekly - 1 sets - 10 reps - 2 seconds  hold  ASSESSMENT:  CLINICAL IMPRESSION:   She arrived with no shoulder pain. She is limited by muscle weakness with flexion and abduction as demonstrated by decreased AROM compared to PROM. With resisted shoulder motions, she required constant cuing to straighten her arm, keep her trunk straight,  and keep her elbow in during ER. She required tactile cues to re-enforce verbal cues. She was given a revised HEP, so she feels more challenged at home. Pt. Unsure how much ionto patch is helping to we decided to differ for a week to see if there are any changes.   Patient is a 78 y.o. F who was seen today for physical therapy evaluation and treatment for Diagnosis M25.511 (ICD-10-CM) - Pain in right shoulder. Objectives as above, main impairments seem to be shoulder stiffness and weakness as well as mild postural limitations. It was a little difficult to get a full and clear history of her shoulder pain and related limitations due to aphasia/word finding issues from previous CVA.  Anticipate that she will likely respond well to a short course of skilled PT services.   OBJECTIVE IMPAIRMENTS: decreased ROM, decreased strength, impaired UE functional use, postural dysfunction, and pain.   ACTIVITY LIMITATIONS: carrying, lifting, toileting, dressing, reach over head, and hygiene/grooming  PARTICIPATION LIMITATIONS: meal prep, cleaning, laundry, driving, shopping, and  community activity  PERSONAL FACTORS: Age, Fitness, Past/current experiences, and Time since onset of injury/illness/exacerbation are also affecting patient's functional outcome.   REHAB POTENTIAL: Good  CLINICAL DECISION MAKING: Stable/uncomplicated  EVALUATION COMPLEXITY: Low   GOALS: Goals reviewed with patient? No  SHORT TERM GOALS: Target date: 05/12/2023    Patient will be compliant with appropriate progressive HEP        GOAL STATUS: 05/01/23 MET  2. R shoulder flexion and ABD AROM to be equal to that of the left          GOAL STATUS: progressing 05/01/23                         3. Will be more aware of posture with all functional tasks with use of ergonomic aides PRN/as desired      GOAL STATUS: progressing 4 /11/25   LONG TERM GOALS: Target date: 06/02/2023     MMT to have improved by at least one grade in all  weak groups       GOAL STATUS: Initial    2. Pain to be 0/10 with all functional tasks R shoulder      GOAL STATUS: Initial   3. Will be able to perform all functional household and personal care/dressing based tasks without increase from resting pain levels     GOAL STATUS: Initial   5. PSFS to have improved by at least 1.5 points to show improved QOL and subjective perception of condition      GOAL STATUS: Initial 8.5/10   PLAN:  PT FREQUENCY: 2x/week  PT DURATION: 6 weeks  PLANNED INTERVENTIONS: 97164- PT Re-evaluation, 97110-Therapeutic exercises, 97530- Therapeutic activity, 97112- Neuromuscular re-education, 97535- Self Care, 16109- Manual therapy, 97016- Vasopneumatic device, Taping, and Dry Needling  PLAN FOR NEXT SESSION: assess HEP as she did require increased verb and tactile cuing. Progress strength   Patient Details  Name: MANDIE CRABBE MRN: [id number] Date of Birth: [birth year] Referring Provider:  Gweneth Dimitri, MD  Encounter Date: 05/07/2023   Maryclare Labrador 05/07/2023, 9:28 AM  [place] [facility] [address] [place], [place], [address] Phone: [phone]   Fax:  [phone] [address] [place], [place], [address] Phone: [phone]   Fax:  [phone]  Patient Details  Name: LETINA LUCKETT MRN: [id number] Date of Birth: May 03, 1945 Referring Provider:  Gweneth Dimitri, MD  Encounter Date: 05/07/2023   Maryclare Labrador 05/07/2023, 9:28 AM  [place] [facility] [address] [place], [place], [address] Phone: [phone]   Fax:  [phone] [address] [place], [place], [address] Phone: [phone]   Fax:  [phone] [facility] at [facility] [address] [place], [place], [address] Phone: [phone]   Fax:  [phone]  Patient Details  Name: HAISLEE CORSO MRN: [id number] Date of Birth: 1945/09/04 Referring Provider:  Gweneth Dimitri, MD  Encounter Date: 05/07/2023   Maryclare Labrador 05/07/2023, 9:28 AM  [place]  [facility] [address] [place], [place], [address] Phone: [phone]   Fax:  [phone]

## 2023-05-12 ENCOUNTER — Encounter: Payer: Self-pay | Admitting: Physical Therapy

## 2023-05-12 ENCOUNTER — Ambulatory Visit: Admitting: Physical Therapy

## 2023-05-12 DIAGNOSIS — M25511 Pain in right shoulder: Secondary | ICD-10-CM | POA: Diagnosis not present

## 2023-05-12 DIAGNOSIS — M25611 Stiffness of right shoulder, not elsewhere classified: Secondary | ICD-10-CM

## 2023-05-12 DIAGNOSIS — M6281 Muscle weakness (generalized): Secondary | ICD-10-CM

## 2023-05-12 DIAGNOSIS — G8929 Other chronic pain: Secondary | ICD-10-CM

## 2023-05-12 NOTE — Therapy (Signed)
 OUTPATIENT PHYSICAL THERAPY SHOULDER    Patient Name: Lisa [name] MRN: [id number] DOB:1945/06/15, 78 y.o., female Today's Date: 05/12/2023    END OF SESSION:  PT End of Session - 05/12/23 1011     Visit Number 6    Number of Visits 13    Date for PT Re-Evaluation 06/02/23    Authorization Type MCR    Authorization Time Period 04/21/23 to 06/02/23    Progress Note Due on Visit 10    PT Start Time 1015    PT Stop Time 1100    PT Time Calculation (min) 45 min             Past Medical History:  Diagnosis Date   Stroke (HCC) 04/06/2019   Uterine cancer (HCC) 01/25/2016   Past Surgical History:  Procedure Laterality Date   BREAST EXCISIONAL BIOPSY Left 1975   LOOP RECORDER INSERTION N/A 04/07/2019   Procedure: LOOP RECORDER INSERTION;  Surgeon: Tammie Fall, MD;  Location: [facility];  Service: Cardiovascular;  Laterality: N/A;   TUBAL LIGATION  1970   Patient Active Problem List   Diagnosis Date Noted   Acquired hammer toe of right foot 05/22/2021   Aphasia 05/22/2021   Dyslipidemia 05/22/2021   Essential hypertension 05/22/2021   Heartburn 05/22/2021   Hypercholesterolemia 05/22/2021   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   Osteopenia of left hip 05/22/2021   Personal history of malignant neoplasm of other parts of uterus 05/22/2021   Psoriasis 05/22/2021   Urticarial rash 05/22/2021   Amnesia 08/02/2020   Acute CVA (cerebrovascular accident) (HCC) 04/07/2019   CVA (cerebral vascular accident) (HCC) 04/05/2019   TIA (transient ischemic attack) 04/05/2019   Endometrial cancer (HCC) 04/22/2017   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 It is an aching pain in her R shoulder. She feels like that pain is worse because she didn't have the Ionto patch last week. She wants a Cortizone shot or Ionto again. She has been doing well with her HEP    SUBJECTIVE STATEMEN    Hand dominance: Right  PERTINENT HISTORY: See above   PAIN:  Are you having pain? No 2/10 now   PRECAUTIONS: None  RED FLAGS: None   WEIGHT BEARING RESTRICTIONS: No  FALLS:  Has patient fallen in last 6 months? No  LIVING ENVIRONMENT: Lives with: lives with their spouse Lives in: House/apartment   OCCUPATION: Retired   PLOF: Independent, Independent with basic ADLs, Independent with gait, and Independent with transfers  PATIENT GOALS:get full use of the arm back   NEXT MD VISIT:   OBJECTIVE:  Note: Objective measures were completed at Evaluation unless otherwise noted.    PATIENT SURVEYS:   Patient-Specific Activity Scoring Scheme  "0" represents "unable to perform." "10" represents "able to perform at prior level. 0 1 2 3 4 5 6 7 8 9  10 (Date and Score)   Activity  Eval     1. Carrying anything  7     2. Moving arm  10  3.     4.    5.    Score 8.5    Total score = sum of the activity scores/number of activities Minimum detectable change (90%CI) for average score = 2 points Minimum detectable change (90%CI) for single activity score = 3 points       COGNITION: Overall cognitive status: Within functional limits for tasks assessed     SENSATION: Not tested  POSTURE:  Thoracic kyphosis, tends to hold L shoulder higher than R   UPPER EXTREMITY ROM:  Active ROM Right eval Left eval RT shld standing 05/07/23  Shoulder flexion 128* 144* 155*  Shoulder extension     Shoulder abduction 120* 138* 130*  Shoulder adduction     Shoulder  internal rotation T7 painful  T7   Shoulder external rotation C7 C7   Elbow flexion     Elbow extension     Wrist flexion     Wrist extension     Wrist ulnar deviation     Wrist radial deviation     Wrist pronation     Wrist supination     (Blank rows = not tested)  UPPER EXTREMITY MMT:  MMT Right eval Left eval Rt shoulder  05/07/23  Shoulder flexion 3+ 4 4  Shoulder extension     Shoulder abduction 3 4-   3+  Shoulder adduction     Shoulder internal rotation 4+ 4+   Shoulder external rotation 4- 4+   Middle trapezius     Lower trapezius     Elbow flexion     Elbow extension     Wrist flexion     Wrist extension     Wrist ulnar deviation     Wrist radial deviation     Wrist pronation     Wrist supination     Grip strength (lbs)     (Blank rows = not tested)                                                                                                                                TREATMENT DATE:  05/12/23 UBE L1.5 each way- c/o pain with forward motion, but not backwards PROM and Jt mobs- all directions Red band ER, IR 2x10 -She self corrected form and needed minimal cuing Resisted ABD 1# 2x10 Resisted Flex 1# 1x8- couldn't finish set due to increased pain Green band   Row 2x10  Shoulder ext 2x10 2# Chest press 2x10 2# OH press 2x10 Ionto #4   05/07/23 UBE L2 each way MMT and ROM noted below HEP [id number]  yellow tband shld flex and abd, ER, scap row and shld ext PROM- R shoulder all directions Wall slides R shoulder Flex and abd x10 each Target taps on wall with yellow band x10 each target       05/05/23 UBE L2 each way PROM and Jt mobs  Red band Resisted ER, IR 2x10  Row x10, yellow band x10 Yellow band Shoulder ext 2x10 Weighted Shoulder abd x10- required constant cuing for form and shoulder elevation Chest press 2x10 3#  OH press 2x10 3# Rolling ball on the wall in shoulder flexion and abd circles  20 sec each direction Up  and down 20 sec each #3 ionto       05/01/23 UBE L 2 2 min each way Chest press with serratus 2 sets 10  5# Lat pull down 2 sets 10 15# Seated row 2 sets 10  15# Yellow tband ER and IR 2 sets 10  5# Yellow tband ABD 2 sets 10- difficult and min pain yellow tband HORZ ABD 2 sets 10 2# bent elbow abd PROM end range and joint mobs- trouble relaxing  Ionto #2 lat shld/delt  04/30/23 UBE L 2 2 min each way Finger ladder flexion 8x and abd 8 x with 2# eccentric lowering Red tband row 2 sets 10- verb and tactile cuing needed 2# wt cane OH press seated 10 x Yellow ball chest press 10 x then OH 10 x Supine 3# cane flexion 10 x 2 sets  Supine 2# chest press 10 x then flexion 10 x SL 2# abd and ER 2 sets 10 PROM and joint capsule stretch Ionto RT lateral shld 1.2 cc dex 4 hour leave on patch- edu con use and wear time   04/21/23  Eval, POC, HEP   UBE L2.5 x4 min forward/x4 min backwards at level 1.5 due to shoulder pain when direction was reversed   Shoulder flexion wall slides x10 R Seated shoulder ABD table slides x10 R Scapular retractions- attempted but unable to do with good form, needs more practice/kept combining with other exercise  Corner stretch 2x30 seconds Seated scap retractions no resistance x10      PATIENT EDUCATION: Education details: exam findings, POC, HEP  Person educated: Patient Education method: Explanation, Demonstration, and Handouts Education comprehension: verbalized understanding, returned demonstration, and needs further education  HOME EXERCISE PROGRAM: Access Code: [email or web address] URL: [email or web address] Date: 04/21/2023 Prepared by: Terrel Ferries  Exercises - Standing Shoulder Flexion Stretch on Wall  - 2 x daily - 7 x weekly - 1 sets - 10 reps - 3 seconds  hold - Seated Shoulder Abduction Towel Slide at Table Top  - 2 x daily - 7 x weekly - 1 sets - 10 reps - 3 seconds  hold - Corner Pec Major Stretch  - 2 x daily - 7 x  weekly - 1 sets - 3 reps - 30 seconds  hold - Seated Scapular Retraction  - 2 x daily - 7 x weekly - 1 sets - 10 reps - 2 seconds  hold  ASSESSMENT:  CLINICAL IMPRESSION:   She arrived complaining of some inconsistent mild aching in her Rt shoulder. It was irritated by the UBE going forward. She has been doing well with her HEP and was able to do exercises with minimal cuing. She self corrected her form with all exercises and is visibly getting stronger. Her resisted flexion is still a struggle and she was unable to finish her set due to increased pain. She had her 4th ionto patch applied before she left. She also met her STG of active flex and abd equal to the left.   Patient is a 78 y.o. F who was seen today for physical therapy evaluation and treatment for Diagnosis M25.511 (ICD-10-CM) - Pain in right shoulder. Objectives as above, main impairments seem to be shoulder stiffness  and weakness as well as mild postural limitations. It was a little difficult to get a full and clear history of her shoulder pain and related limitations due to aphasia/word finding issues from previous CVA.  Anticipate that she will likely respond well to a short course of skilled PT services.   OBJECTIVE IMPAIRMENTS: decreased ROM, decreased strength, impaired UE functional use, postural dysfunction, and pain.   ACTIVITY LIMITATIONS: carrying, lifting, toileting, dressing, reach over head, and hygiene/grooming  PARTICIPATION LIMITATIONS: meal prep, cleaning, laundry, driving, shopping, and community activity  PERSONAL FACTORS: Age, Fitness, Past/current experiences, and Time since onset of injury/illness/exacerbation are also affecting patient's functional outcome.   REHAB POTENTIAL: Good  CLINICAL DECISION MAKING: Stable/uncomplicated  EVALUATION COMPLEXITY: Low   GOALS: Goals reviewed with patient? No  SHORT TERM GOALS: Target date: 05/12/2023    Patient will be compliant with appropriate progressive  HEP        GOAL STATUS: 05/01/23 MET  2. R shoulder flexion and ABD AROM to be equal to that of the left          GOAL STATUS: progressing 05/01/23, MET 05/12/23                         3. Will be more aware of posture with all functional tasks with use of ergonomic aides PRN/as desired      GOAL STATUS: progressing 4 /11/25   LONG TERM GOALS: Target date: 06/02/2023     MMT to have improved by at least one grade in all weak groups       GOAL STATUS: Initial    2. Pain to be 0/10 with all functional tasks R shoulder      GOAL STATUS: Initial   3. Will be able to perform all functional household and personal care/dressing based tasks without increase from resting pain levels     GOAL STATUS: Initial   5. PSFS to have improved by at least 1.5 points to show improved QOL and subjective perception of condition      GOAL STATUS: Initial 8.5/10   PLAN:  PT FREQUENCY: 2x/week  PT DURATION: 6 weeks  PLANNED INTERVENTIONS: 97164- PT Re-evaluation, 97110-Therapeutic exercises, 97530- Therapeutic activity, 97112- Neuromuscular re-education, 97535- Self Care, 91478- Manual therapy, 97016- Vasopneumatic device, Taping, and Dry Needling  PLAN FOR NEXT SESSION: assess HEP as she did require increased verb and tactile cuing. Progress strength   Patient Details  Name: Lisa [name] MRN: [id number] Date of Birth: 1946/01/16 Referring Provider:  Helyn Lobstein, MD  Encounter Date: 05/12/2023   Laurelyn Ponder, SPTA 05/12/2023, 10:12 AM  [place] [facility] [address] [place], [place], [address] Phone: [phone]   Fax:  [phone] at [facility] [address] [place], [place], [address] Phone: [phone]   Fax:  [phone]

## 2023-05-14 ENCOUNTER — Ambulatory Visit: Admitting: Physical Therapy

## 2023-05-14 ENCOUNTER — Encounter: Payer: Self-pay | Admitting: Physical Therapy

## 2023-05-14 DIAGNOSIS — M6281 Muscle weakness (generalized): Secondary | ICD-10-CM

## 2023-05-14 DIAGNOSIS — G8929 Other chronic pain: Secondary | ICD-10-CM

## 2023-05-14 DIAGNOSIS — M25611 Stiffness of right shoulder, not elsewhere classified: Secondary | ICD-10-CM

## 2023-05-14 DIAGNOSIS — M25511 Pain in right shoulder: Secondary | ICD-10-CM | POA: Diagnosis not present

## 2023-05-14 NOTE — Therapy (Signed)
 OUTPATIENT PHYSICAL THERAPY SHOULDER    Patient Name: Lisa [name] MRN: [id number] DOB:08-Apr-1945, 78 y.o., female Today's Date: 05/14/2023    END OF SESSION:  PT End of Session - 05/14/23 1020     Visit Number 6    Number of Visits 13    Date for PT Re-Evaluation 06/02/23    Authorization Type MCR    Authorization Time Period 04/21/23 to 06/02/23    Progress Note Due on Visit 10    PT Start Time 1015    PT Stop Time 1100    PT Time Calculation (min) 45 min             Past Medical History:  Diagnosis Date   Stroke (HCC) 04/06/2019   Uterine cancer (HCC) 01/25/2016   Past Surgical History:  Procedure Laterality Date   BREAST EXCISIONAL BIOPSY Left 1975   LOOP RECORDER INSERTION N/A 04/07/2019   Procedure: LOOP RECORDER INSERTION;  Surgeon: Tammie Fall, MD;  Location: [facility];  Service: Cardiovascular;  Laterality: N/A;   TUBAL LIGATION  1970   Patient Active Problem List   Diagnosis Date Noted   Acquired hammer toe of right foot 05/22/2021   Aphasia 05/22/2021   Dyslipidemia 05/22/2021   Essential hypertension 05/22/2021   Heartburn 05/22/2021   Hypercholesterolemia 05/22/2021   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   Osteopenia of left hip 05/22/2021   Personal history of malignant neoplasm of other parts of uterus 05/22/2021   Psoriasis 05/22/2021   Urticarial rash 05/22/2021   Amnesia 08/02/2020   Acute CVA (cerebrovascular accident) (HCC) 04/07/2019   CVA (cerebral vascular accident) (HCC) 04/05/2019   TIA (transient ischemic attack) 04/05/2019   Endometrial cancer (HCC) 04/22/2017   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 She thinks the weighted [facility] press caused the irritation. She wasn't able to do her HEP because she was in too much pain. Her Ionto patch from last session didn't help with her pain. SUBJECTIVE STATEMEN    Hand dominance: Right  PERTINENT HISTORY: See above   PAIN:  Are you having pain? No 0/10 rest, 10/10 with flex past 90*   PRECAUTIONS: None  RED FLAGS: None   WEIGHT BEARING RESTRICTIONS: No  FALLS:  Has patient fallen in last 6 months? No  LIVING ENVIRONMENT: Lives with: lives with their spouse Lives in: House/apartment   OCCUPATION: Retired   PLOF: Independent, Independent with basic ADLs, Independent with gait, and Independent with transfers  PATIENT GOALS:get full use of the arm back   NEXT MD VISIT:   OBJECTIVE:  Note: Objective measures were completed at Evaluation unless otherwise noted.    PATIENT SURVEYS:   Patient-Specific Activity Scoring Scheme  "0" represents "unable to perform." "10" represents "able to perform at prior level. 0 1 2 3 4 5 6 7 8 9  10 (Date and Score)   Activity  Eval     1. Carrying anything  7     2. Moving arm  10  3.     4.    5.    Score 8.5    Total score = sum of the activity scores/number of activities Minimum detectable change (90%CI) for average score = 2 points Minimum detectable change (90%CI) for single activity score = 3 points       COGNITION: Overall cognitive status: Within functional limits for tasks assessed     SENSATION: Not tested  POSTURE:  Thoracic kyphosis, tends to hold L shoulder higher than R   UPPER EXTREMITY ROM:  Active ROM Right eval Left eval RT shld standing 05/07/23  Shoulder flexion 128* 144* 155*  Shoulder extension     Shoulder abduction 120* 138* 130*  Shoulder  adduction     Shoulder internal rotation T7 painful  T7   Shoulder external rotation C7 C7   Elbow flexion     Elbow extension     Wrist flexion     Wrist extension     Wrist ulnar deviation     Wrist radial deviation     Wrist pronation     Wrist supination     (Blank rows = not tested)  UPPER EXTREMITY MMT:  MMT Right eval Left eval Rt shoulder  05/07/23  Shoulder flexion 3+ 4 4  Shoulder extension     Shoulder abduction 3 4-   3+  Shoulder adduction     Shoulder internal rotation 4+ 4+   Shoulder external rotation 4- 4+   Middle trapezius     Lower trapezius     Elbow flexion     Elbow extension     Wrist flexion     Wrist extension     Wrist ulnar deviation     Wrist radial deviation     Wrist pronation     Wrist supination     Grip strength (lbs)     (Blank rows = not tested)                                                                                                                                TREATMENT DATE:  05/14/23 UBE L1 each way Jt mobs and PROM Red ER, IR 2x10 each Yellow resisted abd 2x10 1# 3way pattern (abd/horz add/ext) 2x10 AROM abd- limited by increased pain around 90*  Green TB  Row x10, x12   Ext 2x12  05/12/23 UBE L1.5 each way- c/o pain with forward motion, but not backwards PROM and Jt mobs- all directions Red band ER, IR 2x10 -She self corrected form and needed minimal cuing Resisted ABD 1# 2x10 Resisted Flex 1# 1x8- couldn't finish set due to increased pain Green band   Row 2x10  Shoulder ext 2x10 2# Chest press 2x10 2# OH press 2x10 Ionto #4   05/07/23 UBE L2 each way MMT and ROM noted below HEP [id number]  yellow tband shld flex and abd, ER, scap row and shld ext PROM- R  shoulder all directions Wall slides R shoulder Flex and abd x10 each Target taps on wall with yellow band x10 each target       05/05/23 UBE L2 each way PROM and Jt mobs  Red band Resisted ER, IR 2x10 Row x10, yellow  band x10 Yellow band Shoulder ext 2x10 Weighted Shoulder abd x10- required constant cuing for form and shoulder elevation Chest press 2x10 3#  OH press 2x10 3# Rolling ball on the wall in shoulder flexion and abd circles  20 sec each direction Up and down 20 sec each #3 ionto       05/01/23 UBE L 2 2 min each way Chest press with serratus 2 sets 10  5# Lat pull down 2 sets 10 15# Seated row 2 sets 10  15# Yellow tband ER and IR 2 sets 10  5# Yellow tband ABD 2 sets 10- difficult and min pain yellow tband HORZ ABD 2 sets 10 2# bent elbow abd PROM end range and joint mobs- trouble relaxing  Ionto #2 lat shld/delt  04/30/23 UBE L 2 2 min each way Finger ladder flexion 8x and abd 8 x with 2# eccentric lowering Red tband row 2 sets 10- verb and tactile cuing needed 2# wt cane OH press seated 10 x Yellow ball chest press 10 x then OH 10 x Supine 3# cane flexion 10 x 2 sets  Supine 2# chest press 10 x then flexion 10 x SL 2# abd and ER 2 sets 10 PROM and joint capsule stretch Ionto RT lateral shld 1.2 cc dex 4 hour leave on patch- edu con use and wear time   04/21/23  Eval, POC, HEP   UBE L2.5 x4 min forward/x4 min backwards at level 1.5 due to shoulder pain when direction was reversed   Shoulder flexion wall slides x10 R Seated shoulder ABD table slides x10 R Scapular retractions- attempted but unable to do with good form, needs more practice/kept combining with other exercise  Corner stretch 2x30 seconds Seated scap retractions no resistance x10      PATIENT EDUCATION: Education details: exam findings, POC, HEP  Person educated: Patient Education method: Explanation, Demonstration, and Handouts Education comprehension: verbalized understanding, returned demonstration, and needs further education  HOME EXERCISE PROGRAM: Access Code: [email or web address] URL: [email or web address] Date: 04/21/2023 Prepared by: Terrel Ferries  Exercises - Standing Shoulder  Flexion Stretch on Wall  - 2 x daily - 7 x weekly - 1 sets - 10 reps - 3 seconds  hold - Seated Shoulder Abduction Towel Slide at Table Top  - 2 x daily - 7 x weekly - 1 sets - 10 reps - 3 seconds  hold - Corner Pec Major Stretch  - 2 x daily - 7 x weekly - 1 sets - 3 reps - 30 seconds  hold - Seated Scapular Retraction  - 2 x daily - 7 x weekly - 1 sets - 10 reps - 2 seconds  hold  ASSESSMENT:  CLINICAL IMPRESSION:   She arrived complaining of increased pain with motion from her last session. She was able to do UBE on low resistance. Her active flexion was severely limited by her pain. I performed Jt mobs on her Rt shoulder to allow for more ROM, but with activity pain increased and she struggled to complete exercises with good form. She did well with 3 way pattern, but her Rt shoulder fatigued quickly. I finished the session with Jt mobs again per pt. Request to decrease her pain.  Her pain hasn't been getting over time, so she was encouraged to make an appointment with her doctor.   Patient is a 78 y.o. F who was seen today for physical therapy evaluation and treatment for Diagnosis M25.511 (ICD-10-CM) - Pain in right shoulder. Objectives as above, main impairments seem to be shoulder stiffness and weakness as well as mild postural limitations. It was a little difficult to get a full and clear history of her shoulder pain and related limitations due to aphasia/word finding issues from previous CVA.  Anticipate that she will likely respond well to a short course of skilled PT services.   OBJECTIVE IMPAIRMENTS: decreased ROM, decreased strength, impaired UE functional use, postural dysfunction, and pain.   ACTIVITY LIMITATIONS: carrying, lifting, toileting, dressing, reach over head, and hygiene/grooming  PARTICIPATION LIMITATIONS: meal prep, cleaning, laundry, driving, shopping, and community activity  PERSONAL FACTORS: Age, Fitness, Past/current experiences, and Time since onset of  injury/illness/exacerbation are also affecting patient's functional outcome.   REHAB POTENTIAL: Good  CLINICAL DECISION MAKING: Stable/uncomplicated  EVALUATION COMPLEXITY: Low   GOALS: Goals reviewed with patient? No  SHORT TERM GOALS: Target date: 05/12/2023    Patient will be compliant with appropriate progressive HEP        GOAL STATUS: 05/01/23 MET  2. R shoulder flexion and ABD AROM to be equal to that of the left          GOAL STATUS: progressing 05/01/23, MET 05/12/23                         3. Will be more aware of posture with all functional tasks with use of ergonomic aides PRN/as desired      GOAL STATUS: progressing 4 /11/25   LONG TERM GOALS: Target date: 06/02/2023     MMT to have improved by at least one grade in all weak groups       GOAL STATUS: Initial    2. Pain to be 0/10 with all functional tasks R shoulder      GOAL STATUS: Initial   3. Will be able to perform all functional household and personal care/dressing based tasks without increase from resting pain levels     GOAL STATUS: Initial   5. PSFS to have improved by at least 1.5 points to show improved QOL and subjective perception of condition      GOAL STATUS: Initial 8.5/10   PLAN:  PT FREQUENCY: 2x/week  PT DURATION: 6 weeks  PLANNED INTERVENTIONS: 97164- PT Re-evaluation, 97110-Therapeutic exercises, 97530- Therapeutic activity, 97112- Neuromuscular re-education, 97535- Self Care, 16109- Manual therapy, 97016- Vasopneumatic device, Taping, and Dry Needling  PLAN FOR NEXT SESSION: Progress strength   Patient Details  Name: Lisa [name] MRN: [id number] Date of Birth: 09/03/1945 Referring Provider:  Helyn Lobstein, MD  Encounter Date: 05/14/2023   Laurelyn Ponder, SPTA 05/14/2023, 10:21 AM  [place] [facility] at [facility] [address] [place], [place], [address] Phone: [phone]   Fax:  [phone] at [facility] [address] [place], [place], [address] Phone: [phone]   Fax:  [phone]

## 2023-05-19 ENCOUNTER — Ambulatory Visit: Admitting: Physical Therapy

## 2023-05-26 ENCOUNTER — Ambulatory Visit: Admitting: Physical Therapy

## 2023-05-28 ENCOUNTER — Ambulatory Visit: Admitting: Physical Therapy

## 2023-06-01 ENCOUNTER — Ambulatory Visit (INDEPENDENT_AMBULATORY_CARE_PROVIDER_SITE_OTHER): Payer: Medicare Other

## 2023-06-01 DIAGNOSIS — I639 Cerebral infarction, unspecified: Secondary | ICD-10-CM

## 2023-06-02 ENCOUNTER — Ambulatory Visit: Admitting: Physical Therapy

## 2023-06-02 ENCOUNTER — Ambulatory Visit: Payer: Self-pay | Admitting: Internal Medicine

## 2023-06-02 LAB — CUP PACEART REMOTE DEVICE CHECK
Date Time Interrogation Session: 20250511234229
Implantable Pulse Generator Implant Date: 20210318

## 2023-06-04 ENCOUNTER — Other Ambulatory Visit: Payer: Self-pay | Admitting: Family Medicine

## 2023-06-04 ENCOUNTER — Ambulatory Visit: Admitting: Physical Therapy

## 2023-06-04 DIAGNOSIS — Z1231 Encounter for screening mammogram for malignant neoplasm of breast: Secondary | ICD-10-CM

## 2023-06-16 NOTE — Progress Notes (Signed)
 [facility] Summary Report / Loop Recorder

## 2023-06-16 NOTE — Addendum Note (Signed)
 Addended by: Edra Govern D on: 06/16/2023 04:42 PM   Modules accepted: Orders

## 2023-06-19 ENCOUNTER — Ambulatory Visit
Admission: RE | Admit: 2023-06-19 | Discharge: 2023-06-19 | Disposition: A | Discharge status: Home / Self Care | Source: Ambulatory Visit | Attending: Family Medicine | Admitting: Family Medicine

## 2023-06-19 DIAGNOSIS — Z1231 Encounter for screening mammogram for malignant neoplasm of breast: Secondary | ICD-10-CM

## 2023-07-02 ENCOUNTER — Ambulatory Visit

## 2023-07-02 DIAGNOSIS — I639 Cerebral infarction, unspecified: Secondary | ICD-10-CM

## 2023-07-02 LAB — CUP PACEART REMOTE DEVICE CHECK
Date Time Interrogation Session: 20250611230202
Implantable Pulse Generator Implant Date: 20210318

## 2023-07-05 ENCOUNTER — Ambulatory Visit: Payer: Self-pay | Admitting: Internal Medicine

## 2023-07-21 NOTE — Progress Notes (Signed)
 [facility] Summary Report / Loop Recorder

## 2023-07-21 NOTE — Addendum Note (Signed)
 Addended by: TAWNI DRILLING D on: 07/21/2023 09:47 AM   Modules accepted: Orders

## 2023-08-03 ENCOUNTER — Ambulatory Visit (INDEPENDENT_AMBULATORY_CARE_PROVIDER_SITE_OTHER)

## 2023-08-03 DIAGNOSIS — I639 Cerebral infarction, unspecified: Secondary | ICD-10-CM

## 2023-08-04 LAB — CUP PACEART REMOTE DEVICE CHECK
Date Time Interrogation Session: 20250713231337
Implantable Pulse Generator Implant Date: 20210318

## 2023-08-05 ENCOUNTER — Ambulatory Visit: Payer: Self-pay | Admitting: Internal Medicine

## 2023-08-21 NOTE — Progress Notes (Signed)
 [facility] Summary Report / Loop Recorder

## 2023-09-03 ENCOUNTER — Ambulatory Visit (INDEPENDENT_AMBULATORY_CARE_PROVIDER_SITE_OTHER)

## 2023-09-03 DIAGNOSIS — I639 Cerebral infarction, unspecified: Secondary | ICD-10-CM

## 2023-09-03 LAB — CUP PACEART REMOTE DEVICE CHECK
Date Time Interrogation Session: 20250813230448
Implantable Pulse Generator Implant Date: 20210318

## 2023-09-06 ENCOUNTER — Ambulatory Visit: Payer: Self-pay | Admitting: Internal Medicine

## 2023-09-23 ENCOUNTER — Encounter (HOSPITAL_BASED_OUTPATIENT_CLINIC_OR_DEPARTMENT_OTHER): Payer: Self-pay | Admitting: Family Medicine

## 2023-09-23 ENCOUNTER — Other Ambulatory Visit (HOSPITAL_BASED_OUTPATIENT_CLINIC_OR_DEPARTMENT_OTHER): Payer: Self-pay | Admitting: Family Medicine

## 2023-09-23 DIAGNOSIS — M85852 Other specified disorders of bone density and structure, left thigh: Secondary | ICD-10-CM

## 2023-10-05 ENCOUNTER — Ambulatory Visit (INDEPENDENT_AMBULATORY_CARE_PROVIDER_SITE_OTHER)

## 2023-10-05 DIAGNOSIS — I639 Cerebral infarction, unspecified: Secondary | ICD-10-CM | POA: Diagnosis not present

## 2023-10-06 LAB — CUP PACEART REMOTE DEVICE CHECK
Date Time Interrogation Session: 20250913230255
Implantable Pulse Generator Implant Date: 20210318

## 2023-10-09 ENCOUNTER — Ambulatory Visit: Payer: Self-pay | Admitting: Internal Medicine

## 2023-10-12 NOTE — Progress Notes (Signed)
 Remote Loop Recorder Transmission

## 2023-10-14 NOTE — Progress Notes (Signed)
 Remote Loop Recorder Transmission

## 2023-10-15 ENCOUNTER — Encounter

## 2023-10-29 ENCOUNTER — Encounter (HOSPITAL_BASED_OUTPATIENT_CLINIC_OR_DEPARTMENT_OTHER): Payer: Self-pay

## 2023-10-29 ENCOUNTER — Other Ambulatory Visit: Payer: Self-pay

## 2023-10-29 ENCOUNTER — Ambulatory Visit
Admission: EM | Admit: 2023-10-29 | Discharge: 2023-10-29 | Disposition: A | Discharge status: Other Acute Inpatient Hospital | Attending: Family Medicine | Admitting: Family Medicine

## 2023-10-29 ENCOUNTER — Emergency Department (HOSPITAL_BASED_OUTPATIENT_CLINIC_OR_DEPARTMENT_OTHER)

## 2023-10-29 ENCOUNTER — Emergency Department (HOSPITAL_BASED_OUTPATIENT_CLINIC_OR_DEPARTMENT_OTHER)
Admission: EM | Admit: 2023-10-29 | Discharge: 2023-10-29 | Disposition: A | Discharge status: Home / Self Care | Attending: Emergency Medicine | Admitting: Emergency Medicine

## 2023-10-29 DIAGNOSIS — S50811A Abrasion of right forearm, initial encounter: Secondary | ICD-10-CM | POA: Diagnosis not present

## 2023-10-29 DIAGNOSIS — S0121XA Laceration without foreign body of nose, initial encounter: Secondary | ICD-10-CM | POA: Insufficient documentation

## 2023-10-29 DIAGNOSIS — S0181XA Laceration without foreign body of other part of head, initial encounter: Secondary | ICD-10-CM

## 2023-10-29 DIAGNOSIS — Z7902 Long term (current) use of antithrombotics/antiplatelets: Secondary | ICD-10-CM | POA: Insufficient documentation

## 2023-10-29 DIAGNOSIS — S0990XA Unspecified injury of head, initial encounter: Secondary | ICD-10-CM | POA: Diagnosis present

## 2023-10-29 DIAGNOSIS — S0083XA Contusion of other part of head, initial encounter: Secondary | ICD-10-CM

## 2023-10-29 DIAGNOSIS — Z23 Encounter for immunization: Secondary | ICD-10-CM | POA: Diagnosis not present

## 2023-10-29 DIAGNOSIS — W01198A Fall on same level from slipping, tripping and stumbling with subsequent striking against other object, initial encounter: Secondary | ICD-10-CM | POA: Insufficient documentation

## 2023-10-29 DIAGNOSIS — S90812A Abrasion, left foot, initial encounter: Secondary | ICD-10-CM | POA: Insufficient documentation

## 2023-10-29 DIAGNOSIS — T148XXA Other injury of unspecified body region, initial encounter: Secondary | ICD-10-CM

## 2023-10-29 DIAGNOSIS — S01111A Laceration without foreign body of right eyelid and periocular area, initial encounter: Secondary | ICD-10-CM | POA: Insufficient documentation

## 2023-10-29 DIAGNOSIS — Z8542 Personal history of malignant neoplasm of other parts of uterus: Secondary | ICD-10-CM | POA: Insufficient documentation

## 2023-10-29 DIAGNOSIS — W19XXXA Unspecified fall, initial encounter: Secondary | ICD-10-CM

## 2023-10-29 DIAGNOSIS — S60512A Abrasion of left hand, initial encounter: Secondary | ICD-10-CM | POA: Diagnosis not present

## 2023-10-29 MED ORDER — TETANUS-DIPHTH-ACELL PERTUSSIS 5-2-15.5 LF-MCG/0.5 IM SUSP
0.5000 mL | Freq: Once | INTRAMUSCULAR | Status: AC
  Administered 2023-10-29: 0.5 mL via INTRAMUSCULAR
  Filled 2023-10-29: qty 0.5

## 2023-10-29 NOTE — Progress Notes (Signed)
 Remote Loop Recorder Transmission

## 2023-10-29 NOTE — Discharge Instructions (Addendum)
 No signs of bleeding in the brain or broken bones in your face today.  If you start developing severe headaches, change in vision, confusion or repetitive vomiting return to the emergency room immediately.

## 2023-10-29 NOTE — ED Provider Notes (Signed)
 [place] [facility] Provider Note   CSN: [id number] Arrival date & time: 10/29/23  1943     Patient presents with: Lisa [name] is a 78 y.o. female.   Patient is an 78 year old female with a history of prior stroke and uterine cancer who is on daily Plavix  and presenting today after a trip and fall approximately 5 hours ago.  She was walking and reports her shoe got stuck causing her to fall forward and hit her face on the concrete.  She had no loss of consciousness but does complain of some pain in her face where it hit the pavement.  She is not having any visual changes, difficulty walking or pain in her arms or legs.  She denies any dizziness.  She has no neck pain.  Unknown when her last tetanus shot was.  The history is provided by the patient and medical records.  Fall       Prior to Admission medications   Medication Sig Start Date End Date Taking? Authorizing Provider  atorvastatin  (LIPITOR) 40 MG tablet Take 1 tablet (40 mg total) by mouth daily at 6 PM. 05/11/19   Whitfield Raisin, NP  betamethasone dipropionate 0.05 % cream Apply 1 Application topically daily. 09/21/21   [provider]  Cholecalciferol (VITAMIN D3 PO) Take 25 mg by mouth daily.    [provider]  clopidogrel  (PLAVIX ) 75 MG tablet Take 75 mg by mouth daily. 10/01/20   [provider]  fexofenadine (ALLEGRA) 180 MG tablet Take 180 mg by mouth daily.    [provider]  Magnesium  Oxide 400 MG CAPS Take 1 capsule by mouth daily.    [provider]  Multiple Vitamins-Minerals (CENTRUM SILVER 50+WOMEN PO) Take 1 tablet by mouth daily.    [provider]  SUPER B COMPLEX/C PO Take 1 tablet by mouth daily.    [provider]  vitamin B-12 (CYANOCOBALAMIN ) 100 MCG tablet Take 100 mcg by mouth daily.    [provider]    Allergies: Codeine and Tylenol [acetaminophen]    Review of  Systems  Updated Vital Signs BP (!) 156/73   Pulse 100   Temp 97.8 F (36.6 C)   Resp 18   Ht 5' 3 (1.6 m)   Wt 76.2 kg   SpO2 99%   BMI 29.76 kg/m   Physical Exam Vitals and nursing note reviewed.  Constitutional:      General: She is not in acute distress.    Appearance: She is well-developed.  HENT:     Head: Normocephalic.      Comments: 0.5 cm laceration on the bridge of the nose I did on 1 cm laceration in the medial right eyebrow.  Surrounding abrasions, ecchymosis and swelling noted around the right eye.  Extraocular movements are intact.  Also tenderness with palpation of the right maxilla Eyes:     Pupils: Pupils are equal, round, and reactive to light.  Cardiovascular:     Rate and Rhythm: Normal rate and regular rhythm.     Heart sounds: Normal heart sounds. No murmur heard.    No friction rub.  Pulmonary:     Effort: Pulmonary effort is normal.     Breath sounds: Normal breath sounds. No wheezing or rales.  Abdominal:     General: Bowel sounds are normal. There is no distension.     Palpations: Abdomen is soft.     Tenderness: There is no  abdominal tenderness. There is no guarding or rebound.  Musculoskeletal:        General: No tenderness. Normal range of motion.       Arms:     Cervical back: Normal range of motion and neck supple. No tenderness.       Legs:     Comments: No edema  Skin:    General: Skin is warm and dry.     Findings: No rash.  Neurological:     Mental Status: She is alert and oriented to person, place, and time. Mental status is at baseline.     Cranial Nerves: No cranial nerve deficit.     Sensory: No sensory deficit.     Motor: No weakness.     Gait: Gait normal.  Psychiatric:        Mood and Affect: Mood normal.        Behavior: Behavior normal.     (all labs ordered are listed, but only abnormal results are displayed) Labs Reviewed - No data to display  EKG: None  Radiology: CT Head Wo Contrast Result Date:  10/29/2023 EXAM: CT HEAD AND FACIAL BONES 10/29/2023 09:01:41 PM TECHNIQUE: CT of the head and facial bones was performed without the administration of intravenous contrast. Multiplanar reformatted images are provided for review. Automated exposure control, iterative reconstruction, and/or weight based adjustment of the mA/kV was utilized to reduce the radiation dose to as low as reasonably achievable. COMPARISON: None available. CLINICAL HISTORY: Head trauma, minor (Age >= 65y). 78 y/o female. Pt reports tripping and falling face first on cement 4 hours ago. Denies LOC. Pt on plavix . Bruising and dry blood to R eye, cheek, nose. FINDINGS: CT HEAD BRAIN AND VENTRICLES: No acute intracranial hemorrhage. No mass effect or midline shift. No extra-axial fluid collection. No evidence of acute infarct. No hydrocephalus. Multiple remote infarcts, predominantly in the left ACA and right MCA territories. SKULL AND SCALP: No acute skull fracture. CT FACIAL BONES FACIAL BONES: No acute facial fracture. No mandibular dislocation. Bilateral tmj degenerative change. ORBITS: No acute traumatic injury. SINUSES AND MASTOIDS: No acute abnormality. SOFT TISSUES: Contusion/edema involving the right forehead, nose, periorbital, and cheek regions. IMPRESSION: 1. No acute intracranial abnormality. 2. Right face contusion without acute facial fracture. Electronically signed by: Gilmore Molt MD 10/29/2023 09:17 PM EDT RP Workstation: [id number]   CT Maxillofacial Wo Contrast Result Date: 10/29/2023 EXAM: CT HEAD AND FACIAL BONES 10/29/2023 09:01:41 PM TECHNIQUE: CT of the head and facial bones was performed without the administration of intravenous contrast. Multiplanar reformatted images are provided for review. Automated exposure control, iterative reconstruction, and/or weight based adjustment of the mA/kV was utilized to reduce the radiation dose to as low as reasonably achievable. COMPARISON: None available. CLINICAL HISTORY:  Head trauma, minor (Age >= 65y). 78 y/o female. Pt reports tripping and falling face first on cement 4 hours ago. Denies LOC. Pt on plavix . Bruising and dry blood to R eye, cheek, nose. FINDINGS: CT HEAD BRAIN AND VENTRICLES: No acute intracranial hemorrhage. No mass effect or midline shift. No extra-axial fluid collection. No evidence of acute infarct. No hydrocephalus. Multiple remote infarcts, predominantly in the left ACA and right MCA territories. SKULL AND SCALP: No acute skull fracture. CT FACIAL BONES FACIAL BONES: No acute facial fracture. No mandibular dislocation. Bilateral tmj degenerative change. ORBITS: No acute traumatic injury. SINUSES AND MASTOIDS: No acute abnormality. SOFT TISSUES: Contusion/edema involving the right forehead, nose, periorbital, and cheek regions. IMPRESSION: 1. No acute intracranial abnormality. 2.  Right face contusion without acute facial fracture. Electronically signed by: Gilmore Molt MD 10/29/2023 09:17 PM EDT RP Workstation: [id number]     Procedures  LACERATION REPAIR Performed by: [facility] Authorized by: Benton Shone Consent: Verbal consent obtained. Risks and benefits: risks, benefits and alternatives were discussed Consent given by: patient Patient identity confirmed: provided demographic data Prepped and Draped in normal sterile fashion Wound explored  Laceration Location: forehead and bridge of nose  Laceration Length: 1cm and 0.5cm  No Foreign Bodies seen or palpated  Anesthesia: none Irrigation method: scrub Amount of cleaning: standard  Skin closure: dermabond   Patient tolerance: Patient tolerated the procedure well with no immediate complications.  Medications Ordered in the ED  Tdap (ADACEL) injection 0.5 mL (0.5 mLs Intramuscular Given 10/29/23 2117)                                    Medical Decision Making Amount and/or Complexity of Data Reviewed Radiology: ordered and independent interpretation performed.  Decision-making details documented in ED Course.  Risk Prescription drug management.   Pt with multiple medical problems and comorbidities and presenting today with a complaint that caries a high risk for morbidity and mortality.  Here today after a fall with signs of injury to the face.  Patient is mentating normally and episode happened approximately 5 hours ago.  She does take Plavix .  Imaging of the head and face are pending.  Wounds were repaired with the Dermabond.  Tetanus shot was updated.  9:35 PM CT of the head was negative for bleed and facial CT without fracture.  Radiology reports no acute intracranial abnormalities and right facial contusion without fracture.  At this time patient is stable for discharge home.      Final diagnoses:  Fall, initial encounter  Contusion of face, initial encounter  Abrasion  Facial laceration, initial encounter    ED Discharge Orders     None          Shone Benton, MD 10/29/23 2135

## 2023-10-29 NOTE — Discharge Instructions (Signed)
 Please go to the serum for further evaluation of your injury

## 2023-10-29 NOTE — ED Triage Notes (Signed)
 Pt arrives POV from [facility] UC for further evaluation s/p mechanical fall at home. Pt reports tripping and falling face first on cement 4 hours ago. Denies LOC. Pt on plavix , last dose this morning. Bruising and dry blood to R eye, cheek, nose. Denies HA/dizziness/blurred vision. A/Ox4, RR equal and unlabored. Walked without difficulty to triage.

## 2023-10-29 NOTE — ED Notes (Signed)
 Patient is being discharged from the Urgent Care and sent to the Emergency Department via POV . Per myla Bold, NP, patient is in need of higher level of care due to needing further testing. Patient is aware and verbalizes understanding of plan of care.  Vitals:   10/29/23 1920  BP: (!) 146/86  Pulse: (!) 107  Resp: 18  Temp: 98.8 F (37.1 C)  SpO2: 95%

## 2023-10-29 NOTE — ED Provider Notes (Signed)
 [facility]    CSN: [id number] Arrival date & time: 10/29/23  1914      History   Chief Complaint No chief complaint on file.   HPI Lisa [name] is a 78 y.o. female with a past medical history of CVA, hypertension, dyslipidemia, aphasia presents for fall.  Patient reports about 4 hours ago she fell landing on her face.  She states it was a witnessed fall and denies LOC.  She denies nosebleed, headache, visual changes, dizziness, weakness.  She is on Plavix .  She sustained multiple abrasions to the face as well as significant bruising around the right orbital bone.  Denies any other injuries at this time.  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   Osteopenia of left hip 05/22/2021   Personal history of malignant neoplasm of other parts of uterus 05/22/2021   Psoriasis 05/22/2021   Urticarial rash 05/22/2021   Amnesia 08/02/2020   Acute CVA (cerebrovascular accident) (HCC) 04/07/2019   CVA (cerebral vascular accident) (HCC) 04/05/2019   TIA (transient ischemic attack) 04/05/2019   Endometrial cancer (HCC) 04/22/2017   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      Home Medications    Prior to Admission medications   Medication Sig Start Date  End Date Taking? Authorizing Provider  atorvastatin  (LIPITOR) 40 MG tablet Take 1 tablet (40 mg total) by mouth daily at 6 PM. 05/11/19   Whitfield Raisin, NP  betamethasone dipropionate 0.05 % cream Apply 1 Application topically daily. 09/21/21   [provider]  Cholecalciferol (VITAMIN D3 PO) Take 25 mg by mouth daily.    [provider]  clopidogrel  (PLAVIX ) 75 MG tablet Take 75 mg by mouth daily. 10/01/20   [provider]  fexofenadine (ALLEGRA) 180 MG tablet Take 180 mg by mouth daily.    [provider]  Magnesium  Oxide 400 MG CAPS Take 1 capsule by mouth daily.    [provider]  Multiple Vitamins-Minerals (CENTRUM SILVER 50+WOMEN PO) Take 1 tablet by mouth daily.    [provider]  SUPER B COMPLEX/C PO Take 1 tablet by mouth daily.    [provider]  vitamin B-12 (CYANOCOBALAMIN ) 100 MCG tablet Take 100 mcg by mouth daily.    [provider]    Family History Family History  Problem Relation Age of Onset   Hypertension Mother    Stroke Mother    Hypertension Father     Social History Social History   Tobacco Use   Smoking status: Never   Smokeless tobacco: Never  Vaping Use   Vaping status: Never Used  Substance Use Topics   Alcohol use: Yes    Comment: occasional drinker   Drug use: Never  Allergies   Codeine and Tylenol [acetaminophen]   Review of Systems Review of Systems  HENT:         Fall from standing with multiple facial injuries     Physical Exam Triage Vital Signs ED Triage Vitals [10/29/23 1920]  Encounter Vitals Group     BP (!) 146/86     Girls Systolic BP Percentile      Girls Diastolic BP Percentile      Boys Systolic BP Percentile      Boys Diastolic BP Percentile      Pulse Rate (!) 107     Resp 18     Temp 98.8 F (37.1 C)     Temp Source Oral     SpO2 95 %     Weight      Height      Head Circumference      Peak Flow      Pain Score      Pain Loc       Pain Education      Exclude from Growth Chart    No data found.  Updated Vital Signs BP (!) 146/86   Pulse (!) 107   Temp 98.8 F (37.1 C) (Oral)   Resp 18   SpO2 95%   Visual Acuity Right Eye Distance:   Left Eye Distance:   Bilateral Distance:    Right Eye Near:   Left Eye Near:    Bilateral Near:     Physical Exam Vitals and nursing note reviewed.  Constitutional:      General: She is not in acute distress.    Appearance: She is not ill-appearing.  HENT:     Head: Normocephalic.     Comments: Multiple abrasions to the face with ecchymosis around the right orbital bone Eyes:     Pupils: Pupils are equal, round, and reactive to light.  Cardiovascular:     Rate and Rhythm: Normal rate.  Pulmonary:     Effort: Pulmonary effort is normal.  Neurological:     Mental Status: She is alert.     Comments: A&O x 2, not oriented to year  Psychiatric:        Mood and Affect: Mood normal.      UC Treatments / Results  Labs (all labs ordered are listed, but only abnormal results are displayed) Labs Reviewed - No data to display  EKG   Radiology No results found.  Procedures Procedures (including critical care time)  Medications Ordered in UC Medications - No data to display  Initial Impression / Assessment and Plan / UC Course  I have reviewed the triage vital signs and the nursing notes.  Pertinent labs & imaging results that were available during my care of the patient were reviewed by me and considered in my medical decision making (see chart for details).     Patient was triaged given nature of injury.  Given her facial injury, age and that she is on blood thinning medications advise she go to the emergency room for further evaluation and imaging.  She is in agreement plan will go POV with her friend driving to the emergency room. Final Clinical Impressions(s) / UC Diagnoses   Final diagnoses:  Fall from standing, initial encounter  Injury of head,  initial encounter     Discharge Instructions      Please go to the serum for further evaluation of your injury    ED Prescriptions   None  PDMP not reviewed this encounter.   Loreda Myla SAUNDERS, NP 10/29/23 [phone]

## 2023-10-29 NOTE — ED Triage Notes (Signed)
 Pt tripped and fell face forward hitting her face of of the cement approx 4 hrs ago. Pt's right eye, cheek bone, nose are ecchymotic. PT denies LOC. PT denies HA or dizziness. PT is on plavix .

## 2023-11-05 ENCOUNTER — Encounter

## 2023-11-05 ENCOUNTER — Ambulatory Visit

## 2023-11-05 DIAGNOSIS — I639 Cerebral infarction, unspecified: Secondary | ICD-10-CM | POA: Diagnosis not present

## 2023-11-08 LAB — CUP PACEART REMOTE DEVICE CHECK
Date Time Interrogation Session: 20251016230434
Implantable Pulse Generator Implant Date: 20210318

## 2023-11-11 ENCOUNTER — Ambulatory Visit: Payer: Self-pay | Admitting: Internal Medicine

## 2023-11-12 NOTE — Progress Notes (Signed)
 Remote Loop Recorder Transmission

## 2023-11-16 ENCOUNTER — Encounter

## 2023-12-06 ENCOUNTER — Encounter

## 2023-12-07 ENCOUNTER — Encounter

## 2023-12-07 ENCOUNTER — Ambulatory Visit: Attending: Internal Medicine

## 2023-12-07 DIAGNOSIS — I639 Cerebral infarction, unspecified: Secondary | ICD-10-CM | POA: Diagnosis not present

## 2023-12-07 LAB — CUP PACEART REMOTE DEVICE CHECK
Date Time Interrogation Session: 20251116231046
Implantable Pulse Generator Implant Date: 20210318

## 2023-12-09 ENCOUNTER — Ambulatory Visit: Payer: Self-pay | Admitting: Internal Medicine

## 2023-12-09 NOTE — Progress Notes (Signed)
 Remote Loop Recorder Transmission

## 2023-12-17 ENCOUNTER — Encounter

## 2024-01-06 ENCOUNTER — Encounter

## 2024-01-07 ENCOUNTER — Ambulatory Visit: Attending: Internal Medicine

## 2024-01-07 DIAGNOSIS — I639 Cerebral infarction, unspecified: Secondary | ICD-10-CM | POA: Diagnosis not present

## 2024-01-07 LAB — CUP PACEART REMOTE DEVICE CHECK
Date Time Interrogation Session: 20251217230319
Implantable Pulse Generator Implant Date: 20210318

## 2024-01-11 NOTE — Progress Notes (Signed)
 Remote Loop Recorder Transmission

## 2024-01-17 ENCOUNTER — Ambulatory Visit: Payer: Self-pay | Admitting: Internal Medicine

## 2024-01-18 ENCOUNTER — Encounter

## 2024-02-06 ENCOUNTER — Encounter

## 2024-02-07 ENCOUNTER — Ambulatory Visit: Attending: Cardiovascular Disease

## 2024-02-07 DIAGNOSIS — I639 Cerebral infarction, unspecified: Secondary | ICD-10-CM | POA: Diagnosis not present

## 2024-02-08 LAB — CUP PACEART REMOTE DEVICE CHECK
Date Time Interrogation Session: 20260117232113
Implantable Pulse Generator Implant Date: 20210318

## 2024-02-12 NOTE — Progress Notes (Signed)
 Remote Loop Recorder Transmission

## 2024-02-16 ENCOUNTER — Ambulatory Visit: Payer: Self-pay | Admitting: Cardiovascular Disease

## 2024-02-18 ENCOUNTER — Encounter

## 2024-03-08 ENCOUNTER — Encounter

## 2024-03-09 ENCOUNTER — Ambulatory Visit

## 2024-03-21 ENCOUNTER — Encounter

## 2024-04-09 ENCOUNTER — Ambulatory Visit

## 2024-04-21 ENCOUNTER — Encounter

## 2024-05-10 ENCOUNTER — Ambulatory Visit

## 2024-05-23 ENCOUNTER — Encounter

## 2024-06-10 ENCOUNTER — Ambulatory Visit

## 2024-07-11 ENCOUNTER — Ambulatory Visit

## 2024-08-11 ENCOUNTER — Ambulatory Visit

## 2024-09-11 ENCOUNTER — Ambulatory Visit

## 2024-10-12 ENCOUNTER — Ambulatory Visit

## 2024-11-12 ENCOUNTER — Ambulatory Visit

## 2024-12-13 ENCOUNTER — Ambulatory Visit

## 2025-01-13 ENCOUNTER — Ambulatory Visit

## 2025-02-13 ENCOUNTER — Ambulatory Visit
